# Patient Record
Sex: Male | Born: 1970 | Race: Black or African American | Hispanic: No | Marital: Single | Smoking: Current every day smoker
Health system: Southern US, Community
[De-identification: ages and names within clinical notes are randomized; demographics above are authoritative.]

## PROBLEM LIST (undated history)

## (undated) DIAGNOSIS — F1020 Alcohol dependence, uncomplicated: Secondary | ICD-10-CM

## (undated) DIAGNOSIS — I10 Essential (primary) hypertension: Secondary | ICD-10-CM

## (undated) DIAGNOSIS — K219 Gastro-esophageal reflux disease without esophagitis: Secondary | ICD-10-CM

## (undated) DIAGNOSIS — K861 Other chronic pancreatitis: Secondary | ICD-10-CM

## (undated) HISTORY — PX: OTHER SURGICAL HISTORY: SHX169

## (undated) HISTORY — PX: WRIST SURGERY: SHX841

---

## 2006-07-02 ENCOUNTER — Encounter (HOSPITAL_COMMUNITY): Admission: RE | Admit: 2006-07-02 | Discharge: 2006-08-01 | Payer: Self-pay

## 2008-01-03 ENCOUNTER — Inpatient Hospital Stay (HOSPITAL_COMMUNITY): Admission: EM | Admit: 2008-01-03 | Discharge: 2008-01-07 | Payer: Self-pay | Admitting: Emergency Medicine

## 2008-01-04 ENCOUNTER — Ambulatory Visit: Payer: Self-pay | Admitting: Gastroenterology

## 2008-02-12 ENCOUNTER — Ambulatory Visit: Payer: Self-pay | Admitting: Gastroenterology

## 2008-08-25 ENCOUNTER — Ambulatory Visit: Payer: Self-pay | Admitting: Gastroenterology

## 2009-08-05 ENCOUNTER — Encounter (INDEPENDENT_AMBULATORY_CARE_PROVIDER_SITE_OTHER): Payer: Self-pay | Admitting: *Deleted

## 2009-08-30 DIAGNOSIS — Z794 Long term (current) use of insulin: Secondary | ICD-10-CM

## 2009-08-30 DIAGNOSIS — K859 Acute pancreatitis without necrosis or infection, unspecified: Secondary | ICD-10-CM | POA: Insufficient documentation

## 2009-08-30 DIAGNOSIS — E119 Type 2 diabetes mellitus without complications: Secondary | ICD-10-CM

## 2009-08-30 DIAGNOSIS — F1010 Alcohol abuse, uncomplicated: Secondary | ICD-10-CM

## 2009-08-30 DIAGNOSIS — E669 Obesity, unspecified: Secondary | ICD-10-CM

## 2009-08-30 DIAGNOSIS — F1210 Cannabis abuse, uncomplicated: Secondary | ICD-10-CM

## 2009-08-30 DIAGNOSIS — Z720 Tobacco use: Secondary | ICD-10-CM | POA: Insufficient documentation

## 2009-10-31 ENCOUNTER — Ambulatory Visit: Payer: Self-pay | Admitting: Gastroenterology

## 2009-10-31 DIAGNOSIS — K219 Gastro-esophageal reflux disease without esophagitis: Secondary | ICD-10-CM | POA: Insufficient documentation

## 2010-10-08 ENCOUNTER — Encounter: Payer: Self-pay | Admitting: Emergency Medicine

## 2010-10-19 NOTE — Assessment & Plan Note (Signed)
Summary: E30/ABD PAIN,GU   Visit Type:  Follow-up Visit Primary Care Provider:  Braylee Bosher [name]  Chief Complaint:  F/U needs Rx for Omeprazole.  History of Present Illness: [name] is a 40 y/o make here for return visit. He has h/o alcoholic pancreatitis (2009), DM, GERD. If he misses an omeprazole he has heartburn. He has occasional abdominal pain if he "overdoes it" with the alcohol. He reports drinking four 24 ounce beers on Sat or Sun each week. He denies alcohol use during the week. He had recent bloodwork at his PCP, 3 month check. He states his DM is under control. He has only rare vomiting. Denies melena, brbpr, constipation, diarrhea. He denies unintentional wt loss, but wt down from 249 to 214 in the last one year. He says he's been walking daily.   Current Medications (verified): 1)  Lantus 100 Unit/ml Soln (Insulin Glargine) .... 35 Units At Bedtime 2)  Humalog 100 Unit/ml Soln (Insulin Lispro (Human)) .Marland Kitchen.. 10 Units Before Meals 3)  Metformin Hcl 500 Mg Tabs (Metformin Hcl) .... Two Times A Day 4)  Omeprazole .... Once Daily 5)  Lisinopril .... Once Daily  Allergies (verified): No Known Drug Allergies  Review of Systems      See HPI  Vital Signs:  Patient profile:   40 year old male Height:      73.5 inches Weight:      214 pounds BMI:     27.95 Temp:     98.5 degrees F oral Pulse rate:   80 / minute BP sitting:   120 / 88  (left arm) Cuff size:   regular  Vitals Entered By: Cloria Spring LPN (October 31, 2009 10:29 AM)  Physical Exam  General:  Well developed, well nourished, no acute distress. Head:  Normocephalic and atraumatic. Eyes:  Conjunctivae pink, no scleral icterus.  Mouth:  Oropharyngeal mucosa moist, pink.  No lesions, erythema or exudate.    Lungs:  Clear throughout to auscultation. Heart:  Regular rate and rhythm; no murmurs, rubs,  or bruits. Abdomen:  Bowel sounds normal.  Abdomen is soft, nontender, nondistended.  No rebound or guarding.  No  hepatosplenomegaly, masses or hernias.  No abdominal bruits.  Extremities:  No clubbing, cyanosis, edema or deformities noted. Neurologic:  Alert and  oriented x4;  grossly normal neurologically. Skin:  Intact without significant lesions or rashes. Psych:  Alert and cooperative. Normal mood and affect.  Impression & Recommendations:  Problem # 1:  PANCREATITIS (ICD-577.0)  H/O pancreatitis secondary to alcohol use. No further significant symptoms of pancreatitis. Last documented episode in 4/09. He continues to drink one day on the weekend. Again encouraged him to stop drinking. Explanied benefits of alcohol cessation diabetes, liver, and pancreatic disease. Encouraged him to continue daily excercise. OV in one year.  Orders: Est. Patient Level II (78295)  Problem # 2:  GERD (ICD-530.81)  Controlled on omeprazole.  Orders: Est. Patient Level II (62130) Prescriptions: OMEPRAZOLE 20 MG CPDR (OMEPRAZOLE) one by mouth 30 mins before breakfast daily  #30 x 11   Entered and Authorized by:   [name]   Signed by:   [name] Battles Dixon [name] on 10/31/2009   Method used:   Electronically to        [facility] (retail)       [address]       [place], [place]  [address]       Ph:  [phone]       Fax: [phone]   RxID:   [id number]

## 2010-10-31 ENCOUNTER — Encounter (INDEPENDENT_AMBULATORY_CARE_PROVIDER_SITE_OTHER): Payer: Self-pay | Admitting: *Deleted

## 2010-11-08 NOTE — Letter (Signed)
Summary: Recall Office Visit  [facility]  [address]   [place], [place] [address]   Phone: [phone]  Fax: [phone]      October 31, 2010   [name] [address] [place], [place]  [address] [birth year]   Dear Mr. [name],   According to our records, it is time for you to schedule a follow-up office visit with [place].   At your convenience, please call [phone] to schedule an office visit. If you have any questions, concerns, or feel that this letter is in error, we would appreciate your call.   Sincerely,    Diana Eves  [facility] Ph: [phone]   Fax: [phone]

## 2010-11-16 ENCOUNTER — Emergency Department (HOSPITAL_COMMUNITY)
Admission: EM | Admit: 2010-11-16 | Discharge: 2010-11-16 | Disposition: A | Discharge status: Home / Self Care | Payer: Medicare Other | Attending: Emergency Medicine | Admitting: Emergency Medicine

## 2010-11-16 DIAGNOSIS — R1013 Epigastric pain: Secondary | ICD-10-CM | POA: Insufficient documentation

## 2010-11-16 DIAGNOSIS — Z79899 Other long term (current) drug therapy: Secondary | ICD-10-CM | POA: Insufficient documentation

## 2010-11-16 DIAGNOSIS — I10 Essential (primary) hypertension: Secondary | ICD-10-CM | POA: Insufficient documentation

## 2010-11-16 DIAGNOSIS — Z794 Long term (current) use of insulin: Secondary | ICD-10-CM | POA: Insufficient documentation

## 2010-11-16 DIAGNOSIS — E119 Type 2 diabetes mellitus without complications: Secondary | ICD-10-CM | POA: Insufficient documentation

## 2010-11-16 LAB — DIFFERENTIAL
Basophils Absolute: 0 10*3/uL (ref 0.0–0.1)
Basophils Relative: 0 % (ref 0–1)
Eosinophils Absolute: 0 10*3/uL (ref 0.0–0.7)
Neutrophils Relative %: 76 % (ref 43–77)

## 2010-11-16 LAB — COMPREHENSIVE METABOLIC PANEL
Albumin: 3.8 g/dL (ref 3.5–5.2)
BUN: 6 mg/dL (ref 6–23)
Chloride: 98 mEq/L (ref 96–112)
Creatinine, Ser: 1.07 mg/dL (ref 0.4–1.5)
Total Bilirubin: 0.9 mg/dL (ref 0.3–1.2)
Total Protein: 6.3 g/dL (ref 6.0–8.3)

## 2010-11-16 LAB — POCT CARDIAC MARKERS
CKMB, poc: 1.5 ng/mL (ref 1.0–8.0)
Troponin i, poc: <0.05 ng/mL (ref 0.00–0.09)

## 2010-11-16 LAB — CBC
Platelets: 246 10*3/uL (ref 150–400)
RBC: 3.99 MIL/uL — ABNORMAL LOW (ref 4.22–5.81)
WBC: 8.4 10*3/uL (ref 4.0–10.5)

## 2011-01-30 NOTE — Group Therapy Note (Signed)
NAMEDRUE, HARR NO.:  [id number]   MEDICAL RECORD NO.:  [id number]          PATIENT TYPE:  INP   LOCATION:  IC09                          FACILITY:  [facility]   PHYSICIAN:  Skeet Latch, DO    DATE OF BIRTH:  [name], [name]   DATE OF PROCEDURE:  01/04/2008  DATE OF DISCHARGE:                                 PROGRESS NOTE   SUBJECTIVE:  [name] is feeling better today. The patient has less  abdominal discomfort than yesterday. The patient denies any nausea,  vomiting, or diarrhea today and seems to be improving.   Vital signs are stable. Cardiovascular:  Regular rate and rhythm. No  murmur, rub, or gallop. Lungs are clear to auscultation bilaterally with  no rales, rhonchi, or wheezing. Abdomen is soft and still has some  epigastric tenderness on deep palpation. No rigidity or guarding.  Positive bowel sounds. Extremities:  No clubbing, cyanosis, or edema.   LABS:  Sodium 131, potassium 3.1, chloride 103, CO2 17, glucose 274, BUN  15, creatinine 1.36. Lipid panel showed cholesterol of 295,  triglycerides 241, HDL 31, and LDL 216. ABG showed pH of 7.337, PCO2  25.9, PO2 95.7, bicarb 13.5;  lipase is 610. Phosphorus 2.3, magnesium  2.6, amylase is 277. White count is 14,100. Hemoglobin 13.8, hematocrit  39.3, platelet count 122.   CT of his abdomen performed shows acute pancreatitis.   ASSESSMENT AND PLAN:  1. Diabetic ketoacidosis. For now the patient's IV has been switched      to D5 1/2 normal saline. We will continue to check his blood sugars      as needed. The patient will receive diabetic counseling.  2. Hypokalemia. This will be continued to be replaced via IV and      repeat 4 hours after last dose of KCl.  3. Acute pancreatitis. The patient will be continued to be kept n.p.o.      for now, and gastroenterology has been consulted.  4. Alcohol abuse. The patient has been placed on Librium for now. He      will receive counseling regarding substance  abuse also.  5. Leukocytosis. It seems to be improving. The patient is on IV      Rocephin. We will continue his IV Rocephin at this time and      continue to check his white count with daily labs. We will continue      to follow his amylase and lipase with LFTs. At this time, will      continue current treatment.      Skeet Latch, DO  Electronically Signed     SM/MEDQ  D:  01/04/2008  T:  01/04/2008  Job:  [id number]

## 2011-01-30 NOTE — Assessment & Plan Note (Signed)
NAMEMarland [name]  MARQUICE, UDDIN                 CHART#:  [id number]   DATE:  02/12/2008                       DOB:  08/23/1971   REFERRING PHYSICIAN:  Claretta Fraise, MD.   PROBLEM LIST:  1. Diabetes complicated by diabetic ketoacidosis in April 2009.  2. Alcoholic pancreatitis.  3. Alcohol Abuse   SUBJECTIVE:  Mr. Markarian is a 40 year old male who presents as a return  patient visit.  He has no particular questions, concerns or complaints.  He continues to drink 1-2 beers daily.  He is otherwise doing well.   MEDICATIONS:  1. Lantus 35 units q.h.s.  2. Humalog 10 units before meals.  3. Omeprazole 20 mg b.i.d.  4. Metformin 500 mg b.i.d.  5. Blood pressure medicine.   OBJECTIVE:  VITAL SIGNS:  Weight 246 pounds, height 6 feet 4 inches, BMI  32.1 (obese),  temperature 98, blood pressure 140/94, pulse 80.  GENERAL:  He is in no apparent distress.  Alert and oriented x4.  LUNGS:  Clear to auscultation bilaterally.  CARDIOVASCULAR:  Regular rhythm.  ABDOMEN:  Bowel sounds are present, soft, nontender, nondistended,  obese.   ASSESSMENT:  Mr. Blankenburg is a 40 year old male who presented with  alcoholic pancreatitis with a lipase measured at 2,125.  He also had  diabetic ketoacidosis and renal failure.  He continues to drink alcohol.  Thank you for allowing me to see Mr. Groom in consultation.  My  recommendations follow.   RECOMMENDATIONS:  1. He should continue his omeprazole and he may take it once a day.      He is given a prescription.  2. I strongly urged him to stop drinking and explained the benefits in      regards to his pancreas and his diabetic control.  I did explain to      him that if he continues to drink, he is at risk for developing      chronic pancreatitis.  I said the pain of chronic pancreatitis was      not something that he really wants to have.  So Mr. Hessel will      need to make his own decisions as to      whether or not he continues to drink.  3. Follow up  visit in 6 months.       Kassie Mends, M.D.  Electronically Signed     SM/MEDQ  D:  02/12/2008  T:  02/12/2008  Job:  [id number]

## 2011-01-30 NOTE — Discharge Summary (Signed)
NAMETRESTAN, VAHLE NO.:  [id number]   MEDICAL RECORD NO.:  [id number]          PATIENT TYPE:  INP   LOCATION:  A339                          FACILITY:  [facility]   PHYSICIAN:  Skeet Latch, DO    DATE OF BIRTH:  10-Jan-1971   DATE OF ADMISSION:  01/03/2008  DATE OF DISCHARGE:  04/22/2009LH                               DISCHARGE SUMMARY   BRIEF HOSPITAL COURSE:  Mr. Eidson is a 40 year old African-American  male who presented with a 1 week history of polyuria, polydipsia,  abdominal pain and changes in his incision.  The patient went to [facility].  Blood workup was performed.  The patient was told to  followup, but secondary to his symptoms, he came to the emergency room  to be evaluated.  Upon being seen in the ER here, the patient was found  to have a sodium of 124, potassium 4.6, chloride 190, CO2 14, glucose  76, BUN 19, creatinine 2.15.  He had a few bacteria in his urine.  His  urine was positive for ketones, protein, hemoglobin, bilirubin and  glucose.  The patient was admitted to the ICU with diabetic ketoacidosis  as well as acute pancreatitis.  His initial amylase was 774 and lipase  2,125.  The patient was placed in the ICU.  Placed on IV hydration and  had his potassium replaced via IV and p.o.  The patient received  diabetic counseling.  The patient was subsequently started on IV insulin  drip.  This was discontinued.  The patient was started on D5 half-normal  saline.  The patient is now on sliding-scale insulin and seems to be  improving.   Secondary to his pancreatitis, gastroenterology was consulted.  The  patient was kept n.p.o., started on clear liquids and now he is on a  modified diet.  The patient's initial radiologic studies, CT of his  abdomen showed acute pancreatitis.  The patient was also found to have  history of alcohol abuse.  He was started on Librium and this has been  continued throughout his hospital stay.  The  patient's abdominal pain  has improved.  The patient is eating a modified diet.  His blood sugars  are within reasonable range at this time.  We feel the patient can be  discharged to home.  He has received recommendations with the diabetes  team.  We feel the patient can be discharged with Lantus and Humalog.   DISCHARGE MEDICATIONS:  1. Lantus 30 units subcu. q.h.s.  2. Simvastatin 40 mg daily.  3. Humalog 10 units with meals.  4. Prevacid 30 mg daily.   Vital signs on discharge:  Pulse 105, respirations 27, he is satting 99%  on room air.   LABORATORY DATA:  His blood cultures so far are negative.  Sodium 132,  potassium 3.7, chloride 105, CO2 22, glucose 280, BUN 6, creatinine  0.95, white count 9,800, hemoglobin 11.4, hematocrit 32.3, platelet  count 152,000.   CONDITION ON DISCHARGE:  Stable.   DISPOSITION:  The patient will be discharged to home.   DISCHARGE  INSTRUCTIONS:  1. The patient is to maintain a 2,000 ADA diet.  2. He is to increase his activity slowly.  3. The patient is told to follow with [facility] in the      next 2-3 days to have his blood sugars checked.  4. The patient will be instructed to take his Lantus and NovoLog as      instructed.  This will definitely need to be titrated by his      primary care physician.  Since the patient had pancreatitis, unsure      how his pancreas will respond after it totally resolves.  The      patient needs close monitoring by primary care physician.  5. The patient will be instructed to return to the emergency room if      he has any major complications or concerns.  6. We will try to get patient a sample starter kit for his diabetes      for Glucometer and necessary equipment to check his blood sugars at      least 3 times a day.      Skeet Latch, DO  Electronically Signed     SM/MEDQ  D:  01/06/2008  T:  01/06/2008  Job:  [id number]
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
## 2011-01-30 NOTE — Assessment & Plan Note (Signed)
NAME:  [name], [name]                 CHART#:  [id number]   DATE:  08/25/2008                       DOB:  11-17-1970   REFERRING PHYSICIAN:  Brita Romp. Lewis Moccasin, MD.   PROBLEM LIST:  1. Diabetic ketoacidosis in April 2009 associated with acute alcoholic      pancreatitis and renal failure.  2. Alcohol use.   SUBJECTIVE:  The patient is a 40 year old male who presents as a return  patient visit.  He continues to drink a couple of beers on the  weekends,  2-3 [employer].  He is otherwise doing well.  He denies any  nausea or vomiting.  He occasionally has abdominal pain.  He has not  been hospitalized.   MEDICATIONS:  1. Lantus.  2. Humalog.  3. Omeprazole 20 mg daily.  4. Metformin 500 mg b.i.d.  5. Lisinopril.   OBJECTIVE:  VITAL SIGNS:  Weight 249 pounds (up 3 pounds since May  2009), height 6 feet 1 inches, temperature 98.3, blood pressure 140/92,  and pulse 56.  GENERAL:  He is in no apparent distress.  Alert and oriented x4.LUNGS:  Clear to auscultation bilaterally.CARDIOVASCULAR:  Regular rhythm.  No  murmur.  Normal S1 and S2.ABDOMEN:  Bowel sounds are present, soft,  nontender, nondistended, obese.   ASSESSMENT:  The patient is a 40 year old male who is obese, diabetic,  and continues to use alcohol.  Thank you for allowing me to see the  patient in consultation.  My recommendations follow.   RECOMMENDATIONS:  1. Recommended he lose 10-20 pounds.  2. He should continue to take the omeprazole especially since he      continues to consume alcohol.  3. Again, I urged him to stop drinking and explained the benefits of      alcohol cessation on his      weight, diabetes control, liver disease and pancreatic disease.  4. He has a follow up appointment to see me in 12 months.       Kassie Mends, M.D.  Electronically Signed     SM/MEDQ  D:  08/25/2008  T:  08/25/2008  Job:  [id number]   cc:   Clair Gulling, MD

## 2011-01-30 NOTE — Group Therapy Note (Signed)
[name], [name] NO.:  [id number]   MEDICAL RECORD NO.:  [id number]          PATIENT TYPE:  INP   LOCATION:  IC09                          FACILITY:  [facility]   PHYSICIAN:  Skeet Latch, DO    DATE OF BIRTH:  Jan 06, 1971   DATE OF PROCEDURE:  01/05/2008  DATE OF DISCHARGE:                                 PROGRESS NOTE   SUBJECTIVE:  [name] continues to improve.  The patient still has  some abdominal discomfort but is improving daily.  The patient denies  any nausea, vomiting, diarrhea or any kind of  chest pain at this time.  The patient's diet, he has been started on a clear liquid diet also.   OBJECTIVE:  VITAL SIGNS:  Blood pressure is 108/73, pulse 93,  respirations 18, sating 100% on room air.  CARDIOVASCULAR:  Regular rate and rhythm.  No murmurs, rubs, or gallops.  LUNGS:  Clear to auscultation bilaterally.  No rales, rhonchi, or  wheezing.  ABDOMEN:  Soft.  He has some epigastric and left lower quadrant  tenderness that seems to be improving.  No rigidity or guarding.  EXTREMITIES:  No clubbing, cyanosis, or edema.   LABORATORY:  Lipase is 158, amylase 147.  Sodium 132, potassium  __________  , chloride 105, CO2 24, glucose 142, BUN 8, creatinine 1.06.  White count __________  hemoglobin 12.3, hematocrit 34.1, platelet count  125.   ASSESSMENT/PLAN:  1. Diabetic ketoacidosis.  The patient is currently on D-5 1/2 normal      saline twice a day.  Blood sugars continue to decrease.  He spoke      with diabetes counseling and I feel that we need to be very careful      regarding  insulin drip secondary to the patient having concomitant      acute pancreatitis as sugars may go up after his pancreatitis      resolves.  We will continue to monitor closely and if sugars go up,      may restart him on an insulin drip.  2. Hypokalemia.  Continue to replace, continue with intravenous and      oral.  3. Acute pancreatitis.  We will start her on clear  liquids.  The      patient's amylase and lipase continue to decrease.  The patient      symptoms continue to improve.  4. History of alcohol abuse.  The patient is on Librium, we will      continue at this time.  5. Leukocytosis has resolved.  We will continue him on intravenous      antibiotics at this time.  We will continue to monitor.   The patient continues to improve.  We will transfer to a regular bed  next 24-48 hours.      Skeet Latch, DO  Electronically Signed    SM/MEDQ  D:  01/05/2008  T:  01/05/2008  Job:  [id number]

## 2011-05-24 ENCOUNTER — Inpatient Hospital Stay (HOSPITAL_COMMUNITY): Payer: Medicare Other

## 2011-05-24 ENCOUNTER — Inpatient Hospital Stay (HOSPITAL_COMMUNITY)
Admission: EM | Admit: 2011-05-24 | Discharge: 2011-05-28 | DRG: 871 | Disposition: A | Discharge status: Home Health | Payer: Medicare Other | Source: Other Acute Inpatient Hospital | Attending: Pulmonary Disease | Admitting: Pulmonary Disease

## 2011-05-24 ENCOUNTER — Emergency Department (HOSPITAL_COMMUNITY)
Admission: EM | Admit: 2011-05-24 | Discharge: 2011-05-24 | Disposition: A | Discharge status: Other Acute Inpatient Hospital | Payer: Medicare Other | Source: Home / Self Care | Attending: Emergency Medicine | Admitting: Emergency Medicine

## 2011-05-24 DIAGNOSIS — N179 Acute kidney failure, unspecified: Secondary | ICD-10-CM | POA: Diagnosis present

## 2011-05-24 DIAGNOSIS — E876 Hypokalemia: Secondary | ICD-10-CM | POA: Diagnosis present

## 2011-05-24 DIAGNOSIS — K612 Anorectal abscess: Secondary | ICD-10-CM

## 2011-05-24 DIAGNOSIS — K859 Acute pancreatitis without necrosis or infection, unspecified: Secondary | ICD-10-CM | POA: Diagnosis present

## 2011-05-24 DIAGNOSIS — Z23 Encounter for immunization: Secondary | ICD-10-CM

## 2011-05-24 DIAGNOSIS — E872 Acidosis, unspecified: Secondary | ICD-10-CM | POA: Diagnosis present

## 2011-05-24 DIAGNOSIS — E785 Hyperlipidemia, unspecified: Secondary | ICD-10-CM | POA: Diagnosis present

## 2011-05-24 DIAGNOSIS — IMO0002 Reserved for concepts with insufficient information to code with codable children: Secondary | ICD-10-CM

## 2011-05-24 DIAGNOSIS — D649 Anemia, unspecified: Secondary | ICD-10-CM

## 2011-05-24 DIAGNOSIS — I10 Essential (primary) hypertension: Secondary | ICD-10-CM | POA: Diagnosis present

## 2011-05-24 DIAGNOSIS — K861 Other chronic pancreatitis: Secondary | ICD-10-CM | POA: Diagnosis present

## 2011-05-24 DIAGNOSIS — A419 Sepsis, unspecified organism: Secondary | ICD-10-CM

## 2011-05-24 DIAGNOSIS — E119 Type 2 diabetes mellitus without complications: Secondary | ICD-10-CM | POA: Diagnosis present

## 2011-05-24 DIAGNOSIS — Z794 Long term (current) use of insulin: Secondary | ICD-10-CM

## 2011-05-24 DIAGNOSIS — Z79899 Other long term (current) drug therapy: Secondary | ICD-10-CM

## 2011-05-24 DIAGNOSIS — K863 Pseudocyst of pancreas: Secondary | ICD-10-CM | POA: Diagnosis present

## 2011-05-24 DIAGNOSIS — R6510 Systemic inflammatory response syndrome (SIRS) of non-infectious origin without acute organ dysfunction: Secondary | ICD-10-CM | POA: Diagnosis present

## 2011-05-24 DIAGNOSIS — K862 Cyst of pancreas: Secondary | ICD-10-CM | POA: Diagnosis present

## 2011-05-24 LAB — BASIC METABOLIC PANEL
BUN: 74 mg/dL — ABNORMAL HIGH (ref 6–23)
CO2: UNDETERMINED mEq/L (ref 19–32)
CO2: 10 mEq/L — CL (ref 19–32)
Calcium: UNDETERMINED mg/dL (ref 8.4–10.5)
Calcium: 9 mg/dL (ref 8.4–10.5)
Calcium: 8.3 mg/dL — ABNORMAL LOW (ref 8.4–10.5)
Chloride: UNDETERMINED mEq/L (ref 96–112)
Creatinine, Ser: UNDETERMINED mg/dL (ref 0.50–1.35)
Creatinine, Ser: 7.92 mg/dL — ABNORMAL HIGH (ref 0.50–1.35)
Creatinine, Ser: 6.6 mg/dL — ABNORMAL HIGH (ref 0.50–1.35)
GFR calc Af Amer: 9 mL/min — ABNORMAL LOW (ref >60)
GFR calc non Af Amer: 8 mL/min — ABNORMAL LOW (ref >60)
GFR calc non Af Amer: 9 mL/min — ABNORMAL LOW (ref >60)
Glucose, Bld: UNDETERMINED mg/dL (ref 70–99)
Glucose, Bld: 101 mg/dL — ABNORMAL HIGH (ref 70–99)

## 2011-05-24 LAB — TYPE AND SCREEN: Unit division: 0

## 2011-05-24 LAB — CBC
HCT: 19.1 % — ABNORMAL LOW (ref 39.0–52.0)
HCT: 42.9 % (ref 39.0–52.0)
HCT: 44.3 % (ref 39.0–52.0)
Hemoglobin: 6.6 g/dL — CL (ref 13.0–17.0)
Hemoglobin: 16.2 g/dL (ref 13.0–17.0)
MCH: 34.7 pg — ABNORMAL HIGH (ref 26.0–34.0)
MCH: 35.8 pg — ABNORMAL HIGH (ref 26.0–34.0)
MCHC: 34.6 g/dL (ref 30.0–36.0)
MCHC: 34.7 g/dL (ref 30.0–36.0)
MCHC: 36.6 g/dL — ABNORMAL HIGH (ref 30.0–36.0)
MCV: 100.5 fL — ABNORMAL HIGH (ref 78.0–100.0)
MCV: 100.2 fL — ABNORMAL HIGH (ref 78.0–100.0)
MCV: 97.8 fL (ref 78.0–100.0)
Platelets: 282 10*3/uL (ref 150–400)
RDW: 14.7 % (ref 11.5–15.5)
RDW: 14.8 % (ref 11.5–15.5)
RDW: 14.6 % (ref 11.5–15.5)

## 2011-05-24 LAB — GLUCOSE, CAPILLARY: Glucose-Capillary: 125 mg/dL — ABNORMAL HIGH (ref 70–99)

## 2011-05-24 LAB — DIFFERENTIAL
Basophils Absolute: 0 10*3/uL (ref 0.0–0.1)
Basophils Absolute: 0 10*3/uL (ref 0.0–0.1)
Basophils Relative: 0 % (ref 0–1)
Basophils Relative: 0 % (ref 0–1)
Eosinophils Absolute: 0 10*3/uL (ref 0.0–0.7)
Eosinophils Relative: 0 % (ref 0–5)
Lymphs Abs: 0.9 10*3/uL (ref 0.7–4.0)
Monocytes Absolute: 0.6 10*3/uL (ref 0.1–1.0)
Monocytes Absolute: 1.3 10*3/uL — ABNORMAL HIGH (ref 0.1–1.0)
Neutro Abs: 7.5 10*3/uL (ref 1.7–7.7)

## 2011-05-24 LAB — MRSA PCR SCREENING: MRSA by PCR: NEGATIVE

## 2011-05-24 LAB — LACTIC ACID, PLASMA: Lactic Acid, Venous: 0.4 mmol/L — ABNORMAL LOW (ref 0.5–2.2)

## 2011-05-24 MED ORDER — SODIUM CHLORIDE 0.9 % IV BOLUS (SEPSIS)
1000.0000 mL | Freq: Once | INTRAVENOUS | Status: AC
  Administered 2011-05-24: 1000 mL via INTRAVENOUS

## 2011-05-24 MED ORDER — SODIUM CHLORIDE 0.9 % IV BOLUS (SEPSIS): 500.0000 mL | Freq: Once | INTRAVENOUS | Status: DC

## 2011-05-24 MED ORDER — PIPERACILLIN-TAZOBACTAM 3.375 G IVPB
3.3750 g | Freq: Once | INTRAVENOUS | Status: DC
Start: 2011-05-24 — End: 2011-05-24
  Administered 2011-05-24: 3.375 g via INTRAVENOUS
  Filled 2011-05-24: qty 50

## 2011-05-24 MED ORDER — SODIUM CHLORIDE 0.9 % IV BOLUS (SEPSIS): 1000.0000 mL | Freq: Once | INTRAVENOUS | Status: DC

## 2011-05-24 MED ORDER — DEXTROSE 50 % IV SOLN: 25.0000 g | Freq: Once | INTRAVENOUS | Status: DC

## 2011-05-24 MED ORDER — VANCOMYCIN HCL IN DEXTROSE 1-5 GM/200ML-% IV SOLN
1000.0000 mg | Freq: Once | INTRAVENOUS | Status: AC
  Administered 2011-05-24: 1000 mg via INTRAVENOUS
  Filled 2011-05-24: qty 200

## 2011-05-24 NOTE — ED Provider Notes (Addendum)
Scribed for Dr. Bebe Shaggy, the patient was seen in room 04. This chart was scribed by Hillery Hunter. This patient's care was started at 15:00.   History    CSN: [id number] Arrival date & time: 05/24/2011  2:42 PM  Chief Complaint  Patient presents with  . Abscess  . Rash   Patient is a 40 y.o. male presenting with abscess and rash. The history is provided by the patient.  Abscess  Pertinent negatives include no fever.  Rash     [name] is a 39 y.o. male who presents to the Emergency Department complaining of boil on his buttocks that started one week ago and has gradually worsened. He complains of abdominal pain and shortness of breath and denies fever. He reports that he was drinking alcohol daily but quit one week ago. He has a history of DM and HTN but denies any prior abdominal surgeries.  HPI ELEMENTS:  Location: perineum  Onset: one week ago  Timing: constant, gradually worsenintg  Quality: boil   Modifying factors: tenderness to palpation Context: as above  Associated symptoms: as above     Past Medical History  Diagnosis Date  . Diabetes mellitus     History reviewed. No pertinent past surgical history.  No family history on file.  History  Substance Use Topics  . Smoking status: Current Everyday Smoker  . Smokeless tobacco: Not on file  . Alcohol Use: Yes     says quit a week ago      Review of Systems  Constitutional: Negative for fever and chills.  Respiratory: Positive for shortness of breath.   Gastrointestinal: Positive for abdominal pain.  Genitourinary: Negative for difficulty urinating.  Skin: Positive for rash.  Psychiatric/Behavioral: Negative for confusion.  All other systems reviewed and are negative.    Physical Exam  BP 80/42  Pulse 92  Temp(Src) 97.3 F (36.3 C) (Rectal)  Resp 20  Ht 6\' 1"  (1.854 m)  Wt 170 lb (77.111 kg)  BMI 22.43 kg/m2  SpO2 100%  Physical Exam CONSTITUTIONAL: Well developed/well  nourished HEAD AND FACE: Normocephalic/atraumatic EYES: EOMI/PERRL ENMT: Dry mucous membranes  NECK: supple, no meningeal signs SPINE:entire spine nontender CV: S1/S2 noted, no murmurs/rubs/gallops noted LUNGS: Lungs are clear to auscultation bilaterally, no apparent distress ABDOMEN: soft, nontender, no rebound or guarding GU: No CVA tenderness. Large open wound draining pus in the perineum, it does not extend to the rectum or into the scrotum.  Penis/scrotum normal in appearance NEURO: Pt is awake/alert, moves all extremities x4 EXTREMITIES: pulses normal, full ROM SKIN: warm, color normal PSYCH: no abnormalities of mood noted  ED Course  CRITICAL CARE Performed by: Joya Gaskins Authorized by: Joya Gaskins Total critical care time: 40 minutes Critical care time was exclusive of separately billable procedures and treating other patients. Critical care was necessary to treat or prevent imminent or life-threatening deterioration of the following conditions: circulatory failure, sepsis and shock. Critical care was time spent personally by me on the following activities: discussions with consultants, evaluation of patient's response to treatment, ordering and review of laboratory studies, re-evaluation of patient's condition, examination of patient and development of treatment plan with patient or surrogate.    OTHER DATA REVIEWED: Nursing notes, vital signs, and past medical records reviewed.  DIAGNOSTIC STUDIES: Oxygen Saturation is 100% on room air, normal by my interpretation.    LABS / RADIOLOGY:  Results for orders placed during the hospital encounter of 05/24/11  GLUCOSE, CAPILLARY  Component Value Range   Glucose-Capillary 125 (*) 70 - 99 (mg/dL)  CBC      Component Value Range   WBC 9.0  4.0 - 10.5 (K/uL)   RBC 1.90 (*) 4.22 - 5.81 (MIL/uL)   Hemoglobin 6.6 (*) 13.0 - 17.0 (g/dL)   HCT 19.1 (*) 47.8 - 52.0 (%)   MCV 100.5 (*) 78.0 - 100.0 (fL)   MCH  34.7 (*) 26.0 - 34.0 (pg)   MCHC 34.6  30.0 - 36.0 (g/dL)   RDW 29.5  62.1 - 30.8 (%)   Platelets 139 (*) 150 - 400 (K/uL)  DIFFERENTIAL      Component Value Range   Neutrophils Relative PENDING  43 - 77 (%)   Neutro Abs PENDING  1.7 - 7.7 (K/uL)   Band Neutrophils PENDING  0 - 10 (%)   Lymphocytes Relative PENDING  12 - 46 (%)   Lymphs Abs PENDING  0.7 - 4.0 (K/uL)   Monocytes Relative PENDING  3 - 12 (%)   Monocytes Absolute PENDING  0.1 - 1.0 (K/uL)   Eosinophils Relative PENDING  0 - 5 (%)   Eosinophils Absolute PENDING  0.0 - 0.7 (K/uL)   Basophils Relative PENDING  0 - 1 (%)   Basophils Absolute PENDING  0.0 - 0.1 (K/uL)   WBC Morphology PENDING     RBC Morphology PENDING     Smear Review PENDING     nRBC PENDING  0 (/100 WBC)   Metamyelocytes Relative PENDING     Myelocytes PENDING     Promyelocytes Absolute PENDING     Blasts PENDING        ED COURSE / COORDINATION OF CARE: 15:10. Initial orders include: CBC, BMP, lactic acid, procalcitonin, blood cultures, fingerstick glucose U/A, urine culture,  IV NS 2L bolus wide open, page surgery 15:18. Surgery called back and discussed history and physical. Dr. Lovell Sheehan will be here to see patient in approximately ten minutes. 15:59. Seen by Lovell Sheehan, he feels patient needs higher level of care at [facility] cone with intensivist and surgical eval there.  Does not need emergent debridement 16:00. Ordered Zosyn 3.375 IVPB D/w dr simonds, critical care.  Will accept patient now.  He asks me to continue zosyn/vancomycin.  Start PRBC due to severe anemia.  Will defer imaging for now until he arrives at [facility] BP 104/63  Pulse 96  Temp(Src) 97.3 F (36.3 C) (Rectal)  Resp 24  Ht 6\' 1"  (1.854 m)  Wt 170 lb (77.111 kg)  BMI 22.43 kg/m2  SpO2 100% Pt vitals improved Pt stabilized in ED  MDM: pt stabilized here, but in septic shock due to large wound in perineum    IMPRESSION: Diagnoses that have been ruled out:  Diagnoses that  are still under consideration:  Final diagnoses:  Sepsis  Anemia    PLAN:  Admission/transfer to [facility]   MEDICATIONS GIVEN IN THE E.D.  Medications  sodium chloride 0.9 % bolus 1,000 mL (1000 mL Intravenous Given 05/24/11 1517)  sodium chloride 0.9 % bolus 500 mL (500 mL Intravenous Given 05/24/11)  sodium chloride 0.9 % bolus 1,000 mL (1000 mL Intravenous Given 05/24/11)  piperacillin-tazobactam (ZOSYN) IVPB 3.375 g (3.375 g Intravenous Given 05/24/11 1605)    SCRIBE ATTESTATION: I personally performed the services described in this documentation, which was scribed in my presence. The recorded information has been reviewed and considered. Joya Gaskins, MD       Joya Gaskins, MD 05/24/11 [phone]  D/w lab, they feel initial labs contaminated, will redraw.  Will hold PRBC for now SBP over 110 at this time  Joya Gaskins, MD 05/24/11 [phone]

## 2011-05-24 NOTE — ED Notes (Signed)
CRITICAL VALUE ALERT  Critical value received:  hgb 6.6 and hct 19.1  Date of notification: 05/24/11  Time of notification 15.50  Critical value read back: yes  Nurse who received alert: j. Bing Quarry  MD notified (1st page): [email or web address]  Time of first page: 15:50  MD notified (2nd page):  Time of second page:  Responding MD:  wickline  Time MD responded: 15:50

## 2011-05-24 NOTE — ED Notes (Signed)
Pt reports boil on buttocks x 4 days and rash on lower abd.

## 2011-05-24 NOTE — ED Notes (Signed)
Dr Lovell Sheehan here to eval

## 2011-05-24 NOTE — ED Notes (Signed)
Attempted to insert foley. Resistance met. edp aware. Lab called and stated they were going to redraw lab due to abnormal values

## 2011-05-24 NOTE — ED Notes (Signed)
[name], from lab called and states pt's hgb 14.9 and hct 42.9.

## 2011-05-25 ENCOUNTER — Inpatient Hospital Stay (HOSPITAL_COMMUNITY): Payer: Medicare Other

## 2011-05-25 DIAGNOSIS — N179 Acute kidney failure, unspecified: Secondary | ICD-10-CM

## 2011-05-25 DIAGNOSIS — E876 Hypokalemia: Secondary | ICD-10-CM

## 2011-05-25 DIAGNOSIS — K612 Anorectal abscess: Secondary | ICD-10-CM

## 2011-05-25 DIAGNOSIS — A419 Sepsis, unspecified organism: Secondary | ICD-10-CM

## 2011-05-25 DIAGNOSIS — R6521 Severe sepsis with septic shock: Secondary | ICD-10-CM

## 2011-05-25 LAB — CBC
HCT: 39.1 % (ref 39.0–52.0)
MCH: 34.7 pg — ABNORMAL HIGH (ref 26.0–34.0)
MCV: 96.8 fL (ref 78.0–100.0)
RBC: 4.04 MIL/uL — ABNORMAL LOW (ref 4.22–5.81)
RDW: 14.6 % (ref 11.5–15.5)
WBC: 12.9 10*3/uL — ABNORMAL HIGH (ref 4.0–10.5)

## 2011-05-25 LAB — LIPASE, BLOOD: Lipase: 578 U/L — ABNORMAL HIGH (ref 11–59)

## 2011-05-25 LAB — BASIC METABOLIC PANEL
BUN: 72 mg/dL — ABNORMAL HIGH (ref 6–23)
BUN: 57 mg/dL — ABNORMAL HIGH (ref 6–23)
Chloride: 105 mEq/L (ref 96–112)
Creatinine, Ser: 5.58 mg/dL — ABNORMAL HIGH (ref 0.50–1.35)
Creatinine, Ser: 3.32 mg/dL — ABNORMAL HIGH (ref 0.50–1.35)
GFR calc Af Amer: 25 mL/min — ABNORMAL LOW (ref >60)
GFR calc non Af Amer: 21 mL/min — ABNORMAL LOW (ref >60)
Glucose, Bld: 117 mg/dL — ABNORMAL HIGH (ref 70–99)
Potassium: 2.8 mEq/L — ABNORMAL LOW (ref 3.5–5.1)
Potassium: 3 mEq/L — ABNORMAL LOW (ref 3.5–5.1)

## 2011-05-25 LAB — URINALYSIS, ROUTINE W REFLEX MICROSCOPIC
Glucose, UA: NEGATIVE mg/dL
Protein, ur: 30 mg/dL — AB
Specific Gravity, Urine: 1.014 (ref 1.005–1.030)

## 2011-05-25 LAB — GLUCOSE, CAPILLARY
Glucose-Capillary: 116 mg/dL — ABNORMAL HIGH (ref 70–99)
Glucose-Capillary: 137 mg/dL — ABNORMAL HIGH (ref 70–99)

## 2011-05-25 LAB — URINE MICROSCOPIC-ADD ON

## 2011-05-25 LAB — STREP PNEUMONIAE URINARY ANTIGEN: Strep Pneumo Urinary Antigen: NEGATIVE

## 2011-05-25 LAB — VANCOMYCIN, RANDOM: Vancomycin Rm: 27.9 ug/mL

## 2011-05-26 ENCOUNTER — Inpatient Hospital Stay (HOSPITAL_COMMUNITY): Payer: Medicare Other

## 2011-05-26 LAB — VANCOMYCIN, RANDOM: Vancomycin Rm: 13.1 ug/mL

## 2011-05-26 LAB — URINE CULTURE: Culture: NO GROWTH

## 2011-05-26 LAB — CBC
Platelets: 313 10*3/uL (ref 150–400)
RDW: 14.1 % (ref 11.5–15.5)
WBC: 8.5 10*3/uL (ref 4.0–10.5)

## 2011-05-26 LAB — GLUCOSE, CAPILLARY
Glucose-Capillary: 132 mg/dL — ABNORMAL HIGH (ref 70–99)
Glucose-Capillary: 222 mg/dL — ABNORMAL HIGH (ref 70–99)
Glucose-Capillary: 108 mg/dL — ABNORMAL HIGH (ref 70–99)

## 2011-05-26 LAB — BASIC METABOLIC PANEL
Calcium: 8 mg/dL — ABNORMAL LOW (ref 8.4–10.5)
GFR calc non Af Amer: 39 mL/min — ABNORMAL LOW (ref >60)
Sodium: 135 mEq/L (ref 135–145)

## 2011-05-26 LAB — LACTIC ACID, PLASMA: Lactic Acid, Venous: 0.8 mmol/L (ref 0.5–2.2)

## 2011-05-27 DIAGNOSIS — K612 Anorectal abscess: Secondary | ICD-10-CM

## 2011-05-27 DIAGNOSIS — E876 Hypokalemia: Secondary | ICD-10-CM

## 2011-05-27 LAB — CBC
HCT: 36.1 % — ABNORMAL LOW (ref 39.0–52.0)
Hemoglobin: 13.4 g/dL (ref 13.0–17.0)
MCH: 34.8 pg — ABNORMAL HIGH (ref 26.0–34.0)
MCHC: 37.1 g/dL — ABNORMAL HIGH (ref 30.0–36.0)
MCV: 93.8 fL (ref 78.0–100.0)
Platelets: 315 10*3/uL (ref 150–400)
RBC: 3.85 MIL/uL — ABNORMAL LOW (ref 4.22–5.81)
RDW: 13.9 % (ref 11.5–15.5)
WBC: 7.2 10*3/uL (ref 4.0–10.5)

## 2011-05-27 LAB — BASIC METABOLIC PANEL
BUN: 25 mg/dL — ABNORMAL HIGH (ref 6–23)
CO2: 23 mEq/L (ref 19–32)
Calcium: 8.6 mg/dL (ref 8.4–10.5)
Chloride: 108 mEq/L (ref 96–112)
Creatinine, Ser: 1.06 mg/dL (ref 0.50–1.35)
GFR calc Af Amer: >60 mL/min (ref >60)
GFR calc non Af Amer: >60 mL/min (ref >60)
Glucose, Bld: 136 mg/dL — ABNORMAL HIGH (ref 70–99)
Potassium: 3.2 mEq/L — ABNORMAL LOW (ref 3.5–5.1)
Sodium: 137 mEq/L (ref 135–145)

## 2011-05-27 LAB — WOUND CULTURE

## 2011-05-27 LAB — LEGIONELLA ANTIGEN, URINE: Legionella Antigen, Urine: NEGATIVE

## 2011-05-27 LAB — GLUCOSE, CAPILLARY
Glucose-Capillary: 109 mg/dL — ABNORMAL HIGH (ref 70–99)
Glucose-Capillary: 117 mg/dL — ABNORMAL HIGH (ref 70–99)
Glucose-Capillary: 124 mg/dL — ABNORMAL HIGH (ref 70–99)

## 2011-05-27 LAB — MAGNESIUM: Magnesium: 1.4 mg/dL — ABNORMAL LOW (ref 1.5–2.5)

## 2011-05-28 DIAGNOSIS — R6521 Severe sepsis with septic shock: Secondary | ICD-10-CM

## 2011-05-28 DIAGNOSIS — N170 Acute kidney failure with tubular necrosis: Secondary | ICD-10-CM

## 2011-05-28 DIAGNOSIS — A419 Sepsis, unspecified organism: Secondary | ICD-10-CM

## 2011-05-28 LAB — GLUCOSE, CAPILLARY
Glucose-Capillary: 112 mg/dL — ABNORMAL HIGH (ref 70–99)
Glucose-Capillary: 100 mg/dL — ABNORMAL HIGH (ref 70–99)
Glucose-Capillary: 181 mg/dL — ABNORMAL HIGH (ref 70–99)

## 2011-05-29 LAB — CULTURE, BLOOD (ROUTINE X 2): Culture: NO GROWTH
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
## 2011-05-31 LAB — CULTURE, BLOOD (ROUTINE X 2): Culture: NO GROWTH

## 2011-05-31 NOTE — Discharge Summary (Addendum)
[name], [name] NO.:  [id number]  MEDICAL RECORD NO.:  [id number]  LOCATION:  5006                         FACILITY:  [facility]  PHYSICIAN:  [name] Milch, MD      DATE OF BIRTH:  07/23/1971  DATE OF ADMISSION:  05/24/2011 DATE OF DISCHARGE:  05/28/2011                              DISCHARGE SUMMARY   DISCHARGE DIAGNOSES: 1. Perirectal abscess with systemic inflammatory response syndrome. 2. Acute renal failure secondary to systemic inflammatory response     syndrome/sepsis. 3. Hypertension. 4. Hypomagnesemia.  LINES/TUBES:  None.  MICRODATA:  On May 24, 2011, blood cultures x2 demonstrate no growth on preliminary results, final results pending.  On May 25, 2011, urine culture demonstrates no growth.  On May 24, 2011, wound culture demonstrates multiple organisms present, non-predominant. Second set of blood cultures x2 on May 24, 2011, demonstrate no growth on preliminary results, final results are pending.  On May 25, 2011, urine Legionella is negative.  ANTIBIOTIC DATA:  The patient was placed on vancomycin from May 24, 2011 to May 28, 2011, also on Zosyn from May 24, 2011 to May 28, 2011 secondary to sepsis related to perirectal perineal abscess.  BEST PRACTICE:  The patient was placed on SCDs for DVT prophylaxis.  PROTOCOL/CONSULTANTS:  Dr. Violeta [name] for [facility].  KEY EVENTS/STUDIES:  CT scan of the abdomen on May 24, 2011 demonstrates a small amount of gas at gluteal cleft, question small sinus tract, distended appendix, chronic pancreatitis with evidence of questionable old pseudocyst.  LABORATORY DATA:  On May 27, 2011; CBC demonstrates WBC 7.2, hemoglobin 13.4, hematocrit 36.1, MCV 93.8, MCHC 37.1 with spherocytes noted, platelet count 315.  On May 27, 2011; magnesium 1.4. May 27, 2011; BMP demonstrates sodium 137, potassium 3.2, chloride 108, CO2  23, glucose 136, BUN 25, creatinine 1.06, and calcium 8.6.  RADIOLOGIC DATA:  On May 24, 2011; chest x-ray demonstrates low lung volumes with vascular crowding atelectasis, mild vascular congestion.  On May 25, 2011; CT of the abdomen and pelvis demonstrates small amount of gas just superior to the rectal tube within the region of the gluteal cleft consistent with small sinus tract.  No significant abscess identified, distended strained appendix associated with inflammatory change suspicious for acute appendicitis, changes consistent with chronic pancreatitis, 1.8 cm probable cystic lesion in the region of the uncinate process and represent old pseudocyst.  Chest x-ray on May 26, 2011, demonstrates mild left basilar atelectasis.  HISTORY OF PRESENT ILLNESS:  [name] is a 40 year old African American male with past medical history of insulin-dependent diabetes, hypertension, and pancreatitis in the past secondary to alcohol abuse, who presented to [facility] on May 24, 2011 with seventh day plus or minus history of left perirectal abscess.  He has been seen by his primary care provider and prescribed antibiotics.  He noted mild drainage from the abscess.  Two days prior to admission, however, he had a large amount of spontaneous drainage, since that time, he felt he was not getting any better, went to the [facility] Emergency Department on May 24, 2011.  He was seen and evaluated and noted to have a low  blood pressure and was transferred to [facility] for further care and evaluation in the setting of SIRS/sepsis in relation to perirectal abscess.  He was cultured during hospitalization and cultures to date are negative.  He did undergo a CT of the abdomen and pelvis which demonstrated a small amount of gas at the gluteal cleft and small sinus tract.  There was mention of acute appendicitis per Radiology.  However, this was reviewed by the surgeon and  radiologist and felt that the appendix was within normal limits.  He does have acute on chronic pancreatitis and positive pseudocyst.  [name] was evaluated by Dr. Janee [name] at [facility] during hospitalization and it was felt that he was not a surgical candidate at this time.  He was seen and evaluated by wound ostomy continence nursing staff during hospitalization and currently, the wound is 4 cm x 7 cm x 1 cm with tunneling at 12 o'clock approximately 2 cm, discussed that a gel dressing packed into this area will be best for moist wound healing.  He will need continued wound care and packing to this area at home.  He indicates that at this time that he does have an aunt is a retired Engineer, civil (consulting) that might be able to help with dressing changes at home.  [name] remained in the intensive care unit secondary to metabolic gap acidosis and evidence of sepsis without shock.  He was placed on IV vancomycin and Zosyn.  Again, he was evaluated by Surgery and it was felt that there was no role for debridement at this time.  Cultures were negative. He also was noted to have acute renal failure during hospitalization as well as hypokalemia and hypomagnesemia.  Electrolytes were monitored serially and repleted as needed.  Initial lactic acid was 1.0 with reduction to 0.8 after 24 hours of treatment.  He was transiently placed on a bicarbonate drip and discontinued after gap acidosis closed.  [name] is a known diabetic and was maintained on ICU hyperglycemia protocol.  His p.o. metformin was held during hospitalization and will be resumed at time of discharge.  At the time of discharge dictation, [name] is awake, alert and oriented with no evidence of acute distress.  His vital signs are currently heart rate 87, respirations 18, blood pressure 106/69 and is pending discharge to home with home health followup and follow up with his primary care provider in office for wound check  and follow up BMP for renal function and potassium.  HOSPITAL COURSE BY DISCHARGE DIAGNOSES: 1. Perirectal abscess with systemic inflammatory response     syndrome/sepsis.  As per history of present illness, [name] was     admitted on May 24, 2011 with spontaneous rupture of a     perirectal abscess.  He was seen and evaluated by Surgery and felt     that no debridement was necessary at time of admission.  However     secondary to hypotension and sepsis, he was admitted to the     intensive care unit.  He did require transient bicarbonate therapy     in the setting of gap acidosis.  He was treated with IV vancomycin     and Zosyn, and continued to make improvement.  He was transitioned     out of the intensive care unit on May 26, 2011 and has     clinically continued to improve.  His antibiotics were changed to     Augmentin at time of  discharge for 10 days total. 2. Acute renal failure secondary to systemic inflammatory response     syndrome/sepsis.  [name] is a known diabetic on an ACE     inhibitor prior to admission, ACE inhibitor was held on admit as     well as metformin secondary to acute renal failure.  Followup     laboratory data on May 27, 2011 demonstrates normal     creatinine.  However secondary to low normal blood pressures at     time of discharge, [name]'s ACE inhibitor will be held and this     will need to be reviewed for resomation at time of follow up with     primary care provider. 3. Hypertension.  [name] does have a known history of hypertension     on ACE inhibitor.  Again, please see renal section for discussion     regarding blood pressure medication resomation at time of follow up     with primary care provider. 4. Hypomagnesemia and hypokalemia.  [name] did have transient     electrolyte disturbances during hospitalization and these were     serially monitored and repleted as needed.  At time of discharge,     he will be  given supplemental magnesium for two more days and is     recommended to have a followup BMP for serum creatinine review and     potassium review with a magnesium check as well.  DISCHARGE INSTRUCTIONS: 1. Activity.  Has been instructed to increase activity slowly and as     tolerated.  He was recommended to shower with no bath at this time. 2. Diet.  Diabetic diet. 3. Wound Care.  Per home health care RN, again it was recommended by     wound ostomy consult during hospitalization.  He continue to be     packed with a gel dressing into this area for moist wound healing.     At time of discharge dictation, it was noted that he does have a     clean pink wound bed with granulation full-thickness tissue, wound     size is 4 cm x 7 cm x 1 cm with tunneling at 12 o'clock     approximately 2 cm.  He will need to continue to have this packed     at home with IntraSite gel into perirectal area daily and p.r.n.     secure with topper and mesh underwear.  FOLLOWUP:  [name] is scheduled to follow up with Dr. Fran Lowes at [facility] on June 01, 2011 at 10:15 a.m.  He will need to follow up BMP and magnesium for review.  DISCHARGE MEDICATIONS: 1. Augmentin 875 mg by mouth twice daily for total of 10 days. 2. Magnesium oxide 500 mg by mouth twice daily for 2 days. 3. Lantus 30 units subcutaneously daily at bedtime. 4. Metformin 500 mg by mouth twice daily with meals.  [name] has been instructed to stop taking the following medications: 1. Lisinopril 40 mg daily. 2. Ceftin 500 mg by mouth twice daily. 3. Flagyl 500 mg by mouth twice daily.  DISPOSITION AT TIME OF DISCHARGE:  [name] has met maximum benefit of inpatient therapy and is currently medically stable and cleared for discharge pending follow up with Dr. Fran Lowes at [facility]     Canary Brim, NP   ______________________________ [name] Milch, MD    BO/MEDQ  D:  05/28/2011  T:  05/28/2011  Job:  [id number]  cc:   Dr. Fran Lowes  Electronically Signed by Cyril Mourning MD on 05/31/2011 01:14:09 PM Electronically Signed by Canary Brim  on 06/04/2011 05:46:29 PM

## 2011-06-07 LAB — DRUG SCREEN PANEL (SERUM)

## 2011-06-12 LAB — DIFFERENTIAL
Basophils Absolute: 0.1
Basophils Absolute: 0.1
Basophils Relative: 0
Basophils Relative: 1
Eosinophils Absolute: 1.5 — ABNORMAL HIGH
Eosinophils Relative: 11 — ABNORMAL HIGH
Eosinophils Relative: 14 — ABNORMAL HIGH
Lymphocytes Relative: 24
Lymphocytes Relative: 6 — ABNORMAL LOW
Lymphs Abs: 1.8
Monocytes Absolute: 1.1 — ABNORMAL HIGH
Monocytes Absolute: 1.1 — ABNORMAL HIGH
Monocytes Absolute: 1.5 — ABNORMAL HIGH
Monocytes Relative: 11
Monocytes Relative: 13 — ABNORMAL HIGH
Monocytes Relative: 8
Neutro Abs: 15.1 — ABNORMAL HIGH
Neutro Abs: 5.4
Neutrophils Relative %: 85 — ABNORMAL HIGH

## 2011-06-12 LAB — CBC
HCT: 32.3 — ABNORMAL LOW
HCT: 47.9
Hemoglobin: 11.4 — ABNORMAL LOW
Hemoglobin: 12.3 — ABNORMAL LOW
Hemoglobin: 13.8
MCHC: 35.1
MCHC: 35.3
MCHC: 36
MCV: 98.3
Platelets: 136 — ABNORMAL LOW
Platelets: 152
RBC: 3.64 — ABNORMAL LOW
RBC: 4.12 — ABNORMAL LOW
RDW: 12.7
RDW: 12.7
RDW: 13.1

## 2011-06-12 LAB — BASIC METABOLIC PANEL
BUN: 5 — ABNORMAL LOW
BUN: 6
BUN: 7
CO2: 20
CO2: 21
CO2: 22
CO2: 23
Calcium: 8.8
Calcium: 8.8
Calcium: 9.3
Chloride: 101
Chloride: 103
Chloride: 105
Creatinine, Ser: 1.02
Creatinine, Ser: 1.36
GFR calc Af Amer: >60
GFR calc Af Amer: >60
GFR calc Af Amer: >60
GFR calc non Af Amer: >60
GFR calc non Af Amer: >60
Glucose, Bld: 138 — ABNORMAL HIGH
Glucose, Bld: 142 — ABNORMAL HIGH
Glucose, Bld: 173 — ABNORMAL HIGH
Glucose, Bld: 402 — ABNORMAL HIGH
Potassium: 2.5 — CL
Potassium: 3 — ABNORMAL LOW
Potassium: 3.1 — ABNORMAL LOW
Potassium: 3.1 — ABNORMAL LOW
Potassium: 3.1 — ABNORMAL LOW
Potassium: 3.7
Sodium: 128 — ABNORMAL LOW
Sodium: 130 — ABNORMAL LOW
Sodium: 130 — ABNORMAL LOW
Sodium: 131 — ABNORMAL LOW
Sodium: 132 — ABNORMAL LOW

## 2011-06-12 LAB — CULTURE, BLOOD (ROUTINE X 2)
Culture: NO GROWTH
Report Status: 4232009

## 2011-06-12 LAB — LIPID PANEL
Cholesterol: 295 — ABNORMAL HIGH
HDL: 31 — ABNORMAL LOW

## 2011-06-12 LAB — URINE CULTURE
Colony Count: NO GROWTH
Culture: NO GROWTH

## 2011-06-12 LAB — BLOOD GAS, ARTERIAL
Bicarbonate: 13.5 — ABNORMAL LOW
Bicarbonate: 6.9 — ABNORMAL LOW
FIO2: 0.21
Patient temperature: 37
TCO2: 12.1
pCO2 arterial: 25.9 — ABNORMAL LOW
pH, Arterial: 7.236 — ABNORMAL LOW
pH, Arterial: 7.337 — ABNORMAL LOW
pO2, Arterial: 95.9

## 2011-06-12 LAB — URINALYSIS, ROUTINE W REFLEX MICROSCOPIC
Glucose, UA: 500 — AB
Ketones, ur: 40 — AB
Leukocytes, UA: NEGATIVE
Leukocytes, UA: NEGATIVE
Nitrite: NEGATIVE
Nitrite: NEGATIVE
Protein, ur: 30 — AB
Specific Gravity, Urine: 1.02
Specific Gravity, Urine: 1.025
Urobilinogen, UA: 0.2
Urobilinogen, UA: 0.2
pH: 6.5

## 2011-06-12 LAB — URINE MICROSCOPIC-ADD ON

## 2011-06-12 LAB — COMPREHENSIVE METABOLIC PANEL
ALT: 17
AST: 16
Albumin: 3.8
Alkaline Phosphatase: 59
BUN: 19
Calcium: 9.7
Chloride: 100
Creatinine, Ser: 2.15 — ABNORMAL HIGH
GFR calc Af Amer: >60
Potassium: 3.5
Sodium: 132 — ABNORMAL LOW
Total Bilirubin: 1.8 — ABNORMAL HIGH
Total Protein: 5.9 — ABNORMAL LOW
Total Protein: 7.4

## 2011-06-12 LAB — HEMOGLOBIN A1C
Hgb A1c MFr Bld: 10.5 — ABNORMAL HIGH
Hgb A1c MFr Bld: 9.8 — ABNORMAL HIGH

## 2011-06-12 LAB — PROTIME-INR
INR: 1
Prothrombin Time: 13.1

## 2011-06-12 LAB — AMYLASE
Amylase: 147 — ABNORMAL HIGH
Amylase: 277 — ABNORMAL HIGH

## 2011-06-12 LAB — TSH: TSH: 1.959

## 2011-06-12 LAB — APTT: aPTT: 28

## 2011-06-12 LAB — PHOSPHORUS: Phosphorus: 5.3 — ABNORMAL HIGH

## 2011-06-12 LAB — KETONES, QUALITATIVE

## 2012-07-21 ENCOUNTER — Emergency Department (HOSPITAL_COMMUNITY)
Admission: EM | Admit: 2012-07-21 | Discharge: 2012-07-21 | Disposition: A | Discharge status: Home / Self Care | Payer: Medicare Other | Attending: Emergency Medicine | Admitting: Emergency Medicine

## 2012-07-21 ENCOUNTER — Encounter (HOSPITAL_COMMUNITY): Payer: Self-pay | Admitting: Emergency Medicine

## 2012-07-21 DIAGNOSIS — F17200 Nicotine dependence, unspecified, uncomplicated: Secondary | ICD-10-CM | POA: Insufficient documentation

## 2012-07-21 DIAGNOSIS — L0291 Cutaneous abscess, unspecified: Secondary | ICD-10-CM

## 2012-07-21 DIAGNOSIS — Z79899 Other long term (current) drug therapy: Secondary | ICD-10-CM | POA: Insufficient documentation

## 2012-07-21 DIAGNOSIS — L03211 Cellulitis of face: Secondary | ICD-10-CM | POA: Insufficient documentation

## 2012-07-21 DIAGNOSIS — L0201 Cutaneous abscess of face: Secondary | ICD-10-CM | POA: Insufficient documentation

## 2012-07-21 DIAGNOSIS — E119 Type 2 diabetes mellitus without complications: Secondary | ICD-10-CM | POA: Insufficient documentation

## 2012-07-21 MED ORDER — KETOROLAC TROMETHAMINE 10 MG PO TABS
10.0000 mg | ORAL_TABLET | Freq: Once | ORAL | Status: AC
  Administered 2012-07-21: 10 mg via ORAL
  Filled 2012-07-21: qty 1

## 2012-07-21 MED ORDER — CEFTRIAXONE SODIUM 1 G IJ SOLR
1.0000 g | INTRAMUSCULAR | Status: DC
  Administered 2012-07-21: 1 g via INTRAMUSCULAR
  Filled 2012-07-21: qty 10

## 2012-07-21 MED ORDER — HYDROCODONE-ACETAMINOPHEN 5-325 MG PO TABS: 1.0000 | ORAL_TABLET | ORAL | Status: DC | PRN

## 2012-07-21 MED ORDER — DOXYCYCLINE HYCLATE 100 MG PO CAPS: 100.0000 mg | ORAL_CAPSULE | Freq: Two times a day (BID) | ORAL | Status: DC

## 2012-07-21 MED ORDER — DOXYCYCLINE HYCLATE 100 MG PO TABS
100.0000 mg | ORAL_TABLET | Freq: Once | ORAL | Status: AC
  Administered 2012-07-21: 100 mg via ORAL

## 2012-07-21 MED ORDER — DOXYCYCLINE HYCLATE 100 MG PO TABS
ORAL_TABLET | ORAL | Status: AC
  Filled 2012-07-21: qty 1

## 2012-07-21 MED ORDER — AMOXICILLIN 500 MG PO CAPS: ORAL_CAPSULE | ORAL | Status: DC

## 2012-07-21 MED ORDER — LIDOCAINE HCL (PF) 1 % IJ SOLN
INTRAMUSCULAR | Status: AC
  Filled 2012-07-21: qty 5

## 2012-07-21 MED ORDER — ONDANSETRON HCL 4 MG PO TABS
4.0000 mg | ORAL_TABLET | Freq: Once | ORAL | Status: AC
  Administered 2012-07-21: 4 mg via ORAL
  Filled 2012-07-21: qty 1

## 2012-07-21 MED ORDER — HYDROCODONE-ACETAMINOPHEN 5-325 MG PO TABS
1.0000 | ORAL_TABLET | Freq: Once | ORAL | Status: AC
  Administered 2012-07-21: 1 via ORAL
  Filled 2012-07-21: qty 1

## 2012-07-21 NOTE — ED Notes (Signed)
Pt c/o abscess to left jaw x 1 month.

## 2012-07-21 NOTE — ED Provider Notes (Addendum)
History     CSN: [id number]  Arrival date & time 07/21/12  1451   None     Chief Complaint  Patient presents with  . Abscess    (Consider location/radiation/quality/duration/timing/severity/associated sxs/prior treatment) Patient is a 41 y.o. male presenting with abscess. The history is provided by the patient.  Abscess  This is a new problem. The current episode started more than one week ago. The onset was gradual. The problem occurs continuously. The problem has been gradually worsening. The abscess is present on the face. The problem is moderate. The abscess is characterized by painfulness. It is unknown what he was exposed to. Pertinent negatives include no anorexia, no fever, no vomiting and no cough. His past medical history does not include skin abscesses in family. There were no sick contacts. He has received no recent medical care.    Past Medical History  Diagnosis Date  . Diabetes mellitus     History reviewed. No pertinent past surgical history.  History reviewed. No pertinent family history.  History  Substance Use Topics  . Smoking status: Current Every Day Smoker  . Smokeless tobacco: Not on file  . Alcohol Use: Yes      Review of Systems  Constitutional: Negative for fever and activity change.       All ROS Neg except as noted in HPI  HENT: Negative for nosebleeds and neck pain.   Eyes: Negative for photophobia and discharge.  Respiratory: Negative for cough, shortness of breath and wheezing.   Cardiovascular: Negative for chest pain and palpitations.  Gastrointestinal: Negative for vomiting, abdominal pain, blood in stool and anorexia.  Genitourinary: Negative for dysuria, frequency and hematuria.  Musculoskeletal: Negative for back pain and arthralgias.  Skin: Negative.   Neurological: Negative for dizziness, seizures and speech difficulty.  Psychiatric/Behavioral: Negative for hallucinations and confusion.    Allergies  Review of patient's  allergies indicates no known allergies.  Home Medications   Current Outpatient Rx  Name  Route  Sig  Dispense  Refill  . METFORMIN HCL 500 MG PO TABS   Oral   Take 500 mg by mouth 2 (two) times daily with a meal.           . AMOXICILLIN 500 MG PO CAPS      2 po bid with food   28 capsule   0   . DOXYCYCLINE HYCLATE 100 MG PO CAPS   Oral   Take 1 capsule (100 mg total) by mouth 2 (two) times daily.   14 capsule   0   . HYDROCODONE-ACETAMINOPHEN 5-325 MG PO TABS   Oral   Take 1 tablet by mouth every 4 (four) hours as needed for pain.   20 tablet   0     BP 138/91  Pulse 92  Temp 98.8 F (37.1 C) (Oral)  Resp 20  Ht 6\' 1"  (1.854 m)  Wt 170 lb (77.111 kg)  BMI 22.43 kg/m2  SpO2 100%  Physical Exam  Nursing note and vitals reviewed. Constitutional: He is oriented to person, place, and time. He appears well-developed and well-nourished.  Non-toxic appearance.  HENT:  Head: Normocephalic.  Right Ear: Tympanic membrane and external ear normal.  Left Ear: Tympanic membrane and external ear normal.       Mod sized abscess of the right face (jaw line).  Eyes: EOM and lids are normal. Pupils are equal, round, and reactive to light.  Neck: Normal range of motion. Neck supple. Carotid bruit is not  present.  Cardiovascular: Normal rate, regular rhythm, normal heart sounds, intact distal pulses and normal pulses.   Pulmonary/Chest: Breath sounds normal. No respiratory distress.  Abdominal: Soft. Bowel sounds are normal. There is no tenderness. There is no guarding.  Musculoskeletal: Normal range of motion.  Lymphadenopathy:       Head (right side): No submandibular adenopathy present.       Head (left side): No submandibular adenopathy present.    He has no cervical adenopathy.  Neurological: He is alert and oriented to person, place, and time. He has normal strength. No cranial nerve deficit or sensory deficit.  Skin: Skin is warm and dry.  Psychiatric: He has a normal  mood and affect. His speech is normal.    ED Course  Procedures : ASPIRATION OF ABSCESS RIGHT CHEEK - patient identified by arm band. Duration for the procedure given by the patient. Procedural time out taken before aspiration of abscess of the right cheek carried out. The right cheek was painted with alcohol. It was then painted with chlorhexidine. Using sterile technique a small wheal was raised with 1% plain lidocaine. After adequate anesthetic, 9.3 mL of pus like material was removed from the abscess of the right cheek. With significant improvement in the abscessed area. Sterile bandage was applied by me. Culture was sent to the lab. The patient tolerated the procedure without problem.  Labs Reviewed - No data to display No results found.   1. Abscess       MDM  I have reviewed nursing notes, vital signs, and all appropriate lab and imaging results for this patient. Patient has an abscess of the right jaw that was aspirated while here in the emergency department. The patient is placed on Amoxil 1 g twice a day, and doxycycline 100 mg twice a day. Patient is also given prescription for Norco 5 mg every 4 hours if needed for pain. The patient was treated with Ceftin here in the emergency department. he is invited to return to the emergency department if any changes, problems or concerns. The patient was also given the name of the surgeon on-call for evaluation if this abscess returns.       Kathie Dike, PA 07/21/12 1639  Kathie Dike, PA 07/21/12 1655

## 2012-07-21 NOTE — ED Provider Notes (Signed)
Medical screening examination/treatment/procedure(s) were performed by non-physician practitioner and as supervising physician I was immediately available for consultation/collaboration.  Shelda Jakes, MD 07/21/12 [phone]

## 2012-07-21 NOTE — ED Notes (Signed)
Pt has swelling to rt side of mandible for 1 month.  Tender to palp.  Says it started as a "hair bump" and has gotten bigger and more painful.

## 2012-07-22 NOTE — ED Provider Notes (Signed)
Medical screening examination/treatment/procedure(s) were performed by non-physician practitioner and as supervising physician I was immediately available for consultation/collaboration.   Shelda Jakes, MD 07/22/12 1539

## 2012-07-24 LAB — CULTURE, ROUTINE-ABSCESS

## 2012-07-25 NOTE — ED Notes (Signed)
+  Abscess. Patient given amoxicillin and doxycycline. No sensitivities listed. Chart sent to EDP office for review.

## 2013-01-23 ENCOUNTER — Ambulatory Visit: Payer: Self-pay

## 2013-02-15 ENCOUNTER — Inpatient Hospital Stay (HOSPITAL_COMMUNITY)
Admission: EM | Admit: 2013-02-15 | Discharge: 2013-02-17 | DRG: 440 | Disposition: A | Discharge status: Home / Self Care | Payer: Medicare Other | Attending: Internal Medicine | Admitting: Internal Medicine

## 2013-02-15 ENCOUNTER — Encounter (HOSPITAL_COMMUNITY): Payer: Self-pay | Admitting: Emergency Medicine

## 2013-02-15 ENCOUNTER — Emergency Department (HOSPITAL_COMMUNITY): Payer: Medicare Other

## 2013-02-15 DIAGNOSIS — E876 Hypokalemia: Secondary | ICD-10-CM | POA: Diagnosis present

## 2013-02-15 DIAGNOSIS — K860 Alcohol-induced chronic pancreatitis: Secondary | ICD-10-CM

## 2013-02-15 DIAGNOSIS — Z720 Tobacco use: Secondary | ICD-10-CM | POA: Diagnosis present

## 2013-02-15 DIAGNOSIS — Z833 Family history of diabetes mellitus: Secondary | ICD-10-CM

## 2013-02-15 DIAGNOSIS — E1165 Type 2 diabetes mellitus with hyperglycemia: Secondary | ICD-10-CM | POA: Diagnosis present

## 2013-02-15 DIAGNOSIS — K869 Disease of pancreas, unspecified: Secondary | ICD-10-CM | POA: Diagnosis present

## 2013-02-15 DIAGNOSIS — R7989 Other specified abnormal findings of blood chemistry: Secondary | ICD-10-CM | POA: Diagnosis present

## 2013-02-15 DIAGNOSIS — R109 Unspecified abdominal pain: Secondary | ICD-10-CM

## 2013-02-15 DIAGNOSIS — K8689 Other specified diseases of pancreas: Secondary | ICD-10-CM

## 2013-02-15 DIAGNOSIS — E669 Obesity, unspecified: Secondary | ICD-10-CM

## 2013-02-15 DIAGNOSIS — K859 Acute pancreatitis without necrosis or infection, unspecified: Principal | ICD-10-CM | POA: Diagnosis present

## 2013-02-15 DIAGNOSIS — F10229 Alcohol dependence with intoxication, unspecified: Secondary | ICD-10-CM | POA: Diagnosis present

## 2013-02-15 DIAGNOSIS — I10 Essential (primary) hypertension: Secondary | ICD-10-CM | POA: Diagnosis present

## 2013-02-15 DIAGNOSIS — K219 Gastro-esophageal reflux disease without esophagitis: Secondary | ICD-10-CM

## 2013-02-15 DIAGNOSIS — F1210 Cannabis abuse, uncomplicated: Secondary | ICD-10-CM | POA: Diagnosis present

## 2013-02-15 DIAGNOSIS — IMO0002 Reserved for concepts with insufficient information to code with codable children: Secondary | ICD-10-CM | POA: Diagnosis present

## 2013-02-15 DIAGNOSIS — F1010 Alcohol abuse, uncomplicated: Secondary | ICD-10-CM | POA: Diagnosis present

## 2013-02-15 DIAGNOSIS — F17200 Nicotine dependence, unspecified, uncomplicated: Secondary | ICD-10-CM

## 2013-02-15 DIAGNOSIS — D7589 Other specified diseases of blood and blood-forming organs: Secondary | ICD-10-CM | POA: Diagnosis present

## 2013-02-15 DIAGNOSIS — K861 Other chronic pancreatitis: Secondary | ICD-10-CM | POA: Diagnosis present

## 2013-02-15 DIAGNOSIS — IMO0001 Reserved for inherently not codable concepts without codable children: Secondary | ICD-10-CM | POA: Diagnosis present

## 2013-02-15 DIAGNOSIS — E1110 Type 2 diabetes mellitus with ketoacidosis without coma: Secondary | ICD-10-CM

## 2013-02-15 HISTORY — DX: Essential (primary) hypertension: I10

## 2013-02-15 HISTORY — DX: Alcohol dependence, uncomplicated: F10.20

## 2013-02-15 HISTORY — DX: Other chronic pancreatitis: K86.1

## 2013-02-15 LAB — CBC WITH DIFFERENTIAL/PLATELET
Basophils Relative: 0 % (ref 0–1)
Eosinophils Absolute: 0 10*3/uL (ref 0.0–0.7)
Eosinophils Relative: 0 % (ref 0–5)
Hemoglobin: 14.5 g/dL (ref 13.0–17.0)
MCH: 36 pg — ABNORMAL HIGH (ref 26.0–34.0)
MCHC: 35 g/dL (ref 30.0–36.0)
MCV: 102.7 fL — ABNORMAL HIGH (ref 78.0–100.0)
Monocytes Absolute: 0.6 10*3/uL (ref 0.1–1.0)
Monocytes Relative: 7 % (ref 3–12)
Neutrophils Relative %: 80 % — ABNORMAL HIGH (ref 43–77)

## 2013-02-15 LAB — COMPREHENSIVE METABOLIC PANEL
Albumin: 4.2 g/dL (ref 3.5–5.2)
BUN: 6 mg/dL (ref 6–23)
Calcium: 9.1 mg/dL (ref 8.4–10.5)
Creatinine, Ser: 0.86 mg/dL (ref 0.50–1.35)
GFR calc Af Amer: >90 mL/min (ref >90)
Glucose, Bld: 186 mg/dL — ABNORMAL HIGH (ref 70–99)
Potassium: 3.2 mEq/L — ABNORMAL LOW (ref 3.5–5.1)
Total Protein: 7.6 g/dL (ref 6.0–8.3)

## 2013-02-15 LAB — LIPASE, BLOOD: Lipase: 16 U/L (ref 11–59)

## 2013-02-15 LAB — RAPID URINE DRUG SCREEN, HOSP PERFORMED
Amphetamines: NOT DETECTED
Benzodiazepines: NOT DETECTED
Cocaine: NOT DETECTED
Opiates: POSITIVE — AB

## 2013-02-15 MED ORDER — IOHEXOL 300 MG/ML  SOLN
50.0000 mL | Freq: Once | INTRAMUSCULAR | Status: AC | PRN
  Administered 2013-02-15: 50 mL via ORAL

## 2013-02-15 MED ORDER — SODIUM CHLORIDE 0.9 % IV SOLN
INTRAVENOUS | Status: DC
  Administered 2013-02-15: 18:00:00 via INTRAVENOUS

## 2013-02-15 MED ORDER — ONDANSETRON HCL 4 MG/2ML IJ SOLN
4.0000 mg | INTRAMUSCULAR | Status: AC | PRN
  Administered 2013-02-15 (×2): 4 mg via INTRAVENOUS
  Filled 2013-02-15 (×2): qty 2

## 2013-02-15 MED ORDER — SODIUM CHLORIDE 0.9 % IV SOLN: INTRAVENOUS | Status: DC

## 2013-02-15 MED ORDER — FAMOTIDINE IN NACL 20-0.9 MG/50ML-% IV SOLN
20.0000 mg | Freq: Once | INTRAVENOUS | Status: AC
  Administered 2013-02-15: 20 mg via INTRAVENOUS
  Filled 2013-02-15: qty 50

## 2013-02-15 MED ORDER — MORPHINE SULFATE 4 MG/ML IJ SOLN
4.0000 mg | INTRAMUSCULAR | Status: AC | PRN
  Administered 2013-02-15 (×2): 4 mg via INTRAVENOUS
  Filled 2013-02-15 (×2): qty 1

## 2013-02-15 MED ORDER — IOHEXOL 300 MG/ML  SOLN
100.0000 mL | Freq: Once | INTRAMUSCULAR | Status: AC | PRN
  Administered 2013-02-15: 100 mL via INTRAVENOUS

## 2013-02-15 MED ORDER — HYDROMORPHONE HCL PF 1 MG/ML IJ SOLN: 1.0000 mg | INTRAMUSCULAR | Status: DC | PRN

## 2013-02-15 MED ORDER — ONDANSETRON HCL 4 MG/2ML IJ SOLN: 4.0000 mg | Freq: Three times a day (TID) | INTRAMUSCULAR | Status: DC | PRN

## 2013-02-15 NOTE — ED Notes (Signed)
Patient reports still unable to obtain urine sample at this time. Stated, "I might can urinate after this pain gets under control."

## 2013-02-15 NOTE — ED Provider Notes (Signed)
History     CSN: [id number]  Arrival date & time 02/15/13  1655   First MD Initiated Contact with Patient 02/15/13 1716      Chief Complaint  Patient presents with  . Abdominal Pain     HPI Pt was seen at 1715.   Per pt, c/o gradual onset and persistence of constant upper abd "pain" since yesterday.  Has been associated with multiple intermittent episodes of N/V/D.  Describes the abd pain as "when my pancreatitis acts up."  States the pain began after he "had a couple of beers" yesterday and today.  Denies fevers, no back pain, no rash, no CP/SOB, no black or blood in stools or emesis.       Past Medical History  Diagnosis Date  . Diabetes mellitus   . Chronic pancreatitis   . Alcoholic     No past surgical history on file.    History  Substance Use Topics  . Smoking status: Current Every Day Smoker  . Smokeless tobacco: Not on file  . Alcohol Use: Yes      Review of Systems ROS: Statement: All systems negative except as marked or noted in the HPI; Constitutional: Negative for fever and chills. ; ; Eyes: Negative for eye pain, redness and discharge. ; ; ENMT: Negative for ear pain, hoarseness, nasal congestion, sinus pressure and sore throat. ; ; Cardiovascular: Negative for chest pain, palpitations, diaphoresis, dyspnea and peripheral edema. ; ; Respiratory: Negative for cough, wheezing and stridor. ; ; Gastrointestinal: +N/V/D, abd pain. Negative for blood in stool, hematemesis, jaundice and rectal bleeding. . ; ; Genitourinary: Negative for dysuria, flank pain and hematuria. ; ; Musculoskeletal: Negative for back pain and neck pain. Negative for swelling and trauma.; ; Skin: Negative for pruritus, rash, abrasions, blisters, bruising and skin lesion.; ; Neuro: Negative for headache, lightheadedness and neck stiffness. Negative for weakness, altered level of consciousness , altered mental status, extremity weakness, paresthesias, involuntary movement, seizure and syncope.         Allergies  Review of patient's allergies indicates no known allergies.  Home Medications   Current Outpatient Rx  Name  Route  Sig  Dispense  Refill  . metFORMIN (GLUCOPHAGE) 500 MG tablet   Oral   Take 500 mg by mouth 2 (two) times daily with a meal.             BP 136/84  Pulse 65  Temp(Src) 97.7 F (36.5 C) (Oral)  Resp 18  Ht 6' 1.5" (1.867 m)  Wt 165 lb (74.844 kg)  BMI 21.47 kg/m2  SpO2 100%  Physical Exam 1720: Physical examination:  Nursing notes reviewed; Vital signs and O2 SAT reviewed;  Constitutional: Well developed, Well nourished, Uncomfortable appearing; Head:  Normocephalic, atraumatic; Eyes: EOMI, PERRL, No scleral icterus; ENMT: Mouth and pharynx normal, Mucous membranes dry; Neck: Supple, Full range of motion, No lymphadenopathy; Cardiovascular: Regular rate and rhythm, No murmur, rub, or gallop; Respiratory: Breath sounds clear & equal bilaterally, No rales, rhonchi, wheezes.  Speaking full sentences with ease, Normal respiratory effort/excursion; Chest: Nontender, Movement normal; Abdomen: Soft, +mid-epigastric and LUQ areas tender to palp. No rebound or guarding. Nondistended, Normal bowel sounds; Genitourinary: No CVA tenderness; Extremities: Pulses normal, No tenderness, No edema, No calf edema or asymmetry.; Neuro: AA&Ox3, Major CN grossly intact.  Speech clear. Climbs on and off stretcher by himself. Gait steady. No gross focal motor or sensory deficits in extremities.; Skin: Color normal, Warm, Dry.   ED Course  Procedures  MDM  MDM Reviewed: previous chart, nursing note and vitals Reviewed previous: labs Interpretation: labs, x-ray and CT scan   Results for orders placed during the hospital encounter of 02/15/13  CBC WITH DIFFERENTIAL      Result Value Range   WBC 8.4  4.0 - 10.5 K/uL   RBC 4.03 (*) 4.22 - 5.81 MIL/uL   Hemoglobin 14.5  13.0 - 17.0 g/dL   HCT 04.5  40.9 - 81.1 %   MCV 102.7 (*) 78.0 - 100.0 fL   MCH 36.0 (*)  26.0 - 34.0 pg   MCHC 35.0  30.0 - 36.0 g/dL   RDW 91.4  78.2 - 95.6 %   Platelets 230  150 - 400 K/uL   Neutrophils Relative % 80 (*) 43 - 77 %   Neutro Abs 6.7  1.7 - 7.7 K/uL   Lymphocytes Relative 13  12 - 46 %   Lymphs Abs 1.1  0.7 - 4.0 K/uL   Monocytes Relative 7  3 - 12 %   Monocytes Absolute 0.6  0.1 - 1.0 K/uL   Eosinophils Relative 0  0 - 5 %   Eosinophils Absolute 0.0  0.0 - 0.7 K/uL   Basophils Relative 0  0 - 1 %   Basophils Absolute 0.0  0.0 - 0.1 K/uL  COMPREHENSIVE METABOLIC PANEL      Result Value Range   Sodium 136  135 - 145 mEq/L   Potassium 3.2 (*) 3.5 - 5.1 mEq/L   Chloride 97  96 - 112 mEq/L   CO2 24  19 - 32 mEq/L   Glucose, Bld 186 (*) 70 - 99 mg/dL   BUN 6  6 - 23 mg/dL   Creatinine, Ser 2.13  0.50 - 1.35 mg/dL   Calcium 9.1  8.4 - 08.6 mg/dL   Total Protein 7.6  6.0 - 8.3 g/dL   Albumin 4.2  3.5 - 5.2 g/dL   AST 578 (*) 0 - 37 U/L   ALT 92 (*) 0 - 53 U/L   Alkaline Phosphatase 138 (*) 39 - 117 U/L   Total Bilirubin 0.5  0.3 - 1.2 mg/dL   GFR calc non Af Amer >90  >90 mL/min   GFR calc Af Amer >90  >90 mL/min  LIPASE, BLOOD      Result Value Range   Lipase 16  11 - 59 U/L  URINE RAPID DRUG SCREEN (HOSP PERFORMED)      Result Value Range   Opiates POSITIVE (*) NONE DETECTED   Cocaine NONE DETECTED  NONE DETECTED   Benzodiazepines NONE DETECTED  NONE DETECTED   Amphetamines NONE DETECTED  NONE DETECTED   Tetrahydrocannabinol POSITIVE (*) NONE DETECTED   Barbiturates NONE DETECTED  NONE DETECTED  ETHANOL      Result Value Range   Alcohol, Ethyl (B) 163 (*) 0 - 11 mg/dL   Ct Abdomen Pelvis W Contrast 02/15/2013   *RADIOLOGY REPORT*  Clinical Data: Abdominal pain, history of pancreatitis  CT ABDOMEN AND PELVIS WITH CONTRAST  Technique:  Multidetector CT imaging of the abdomen and pelvis was performed following the standard protocol during bolus administration of intravenous contrast.  Contrast: 50mL OMNIPAQUE IOHEXOL 300 MG/ML  SOLN, OMNIPAQUE  IOHEXOL 300 MG/ML  SOLN  Comparison: 05/25/2011  Findings: Lung bases are clear.  Gallbladder distention noted without other CT evidence for acute cholecystitis.  No radiopaque renal calculus.  1 cm porta hepatis lymph node image 26 is unchanged when allowing for slice selection  differences and previous technique.  Adrenal glands, spleen, liver, and kidneys are normal.  Pancreatic calcifications are re- identified without peripancreatic ductal dilatation or surrounding stranding.  Immediately abutting the pancreatic head, there is a 3.8 x 3.3 by 3.6 cm mass measuring 67 HU.  There is no evidence for adjacent large vessel encasement or invasion.  No bowel wall thickening or focal segmental dilatation is identified.  Bladder is normal.  The appendix is normal, image 38. No ascites, retroperitoneal, or pelvic lymphadenopathy.  No acute osseous abnormality.  IMPRESSION: Mass lesion abutting the head of the pancreas with evidence of chronic pancreatitis but no peripancreatic fluid collection or stranding or pancreatic ductal dilatation.  Differential considerations include right lung related to pancreatitis given the clinical history, or a solid mass.  If the patient has evidence for active pancreatitis, further imaging would be recommended on a non emergent basis as an outpatient, after resolution of the patient's acute symptoms, specifically abdominal MRI with contrast according to pancreatic mass protocol.   Original Report Authenticated By: Christiana Pellant, M.D.    Dg Abd Acute W/chest 02/15/2013   *RADIOLOGY REPORT*  Clinical Data: Abdominal pain.  Nausea.  ACUTE ABDOMEN SERIES (ABDOMEN 2 VIEW & CHEST 1 VIEW)  Comparison: Chest radiographs 05/26/2011 and 05/25/2011.  Abdominal CT 05/25/2011.  Findings: The heart size and mediastinal contours are normal. The lungs are clear. There is no pleural effusion or pneumothorax. No acute osseous findings are identified.  Supine view abdomen demonstrates no significant bowel  distension. However, on the erect view, there are several air-fluid levels within the right mid abdomen which are probably within small bowel. Calcifications of chronic pancreatitis are noted.  There is no free intraperitoneal air or other suspicious calcification.  Spurring of the left iliac crest is unchanged.  IMPRESSION: Nonspecific bowel gas pattern with scattered air fluid levels within nondistended bowel.  This may represent enteritis.  If the patient has obstructive symptoms, follow-up or CT should be considered.   Original Report Authenticated By: Carey Bullocks, M.D.    Results for MCKYLE, SOLANKI (MRN [id number]) as of 02/15/2013 22:13  Ref. Range 01/03/2008 11:45 01/04/2008 06:55 11/16/2010 06:24 02/15/2013 17:47  AST Latest Range: 0-37 U/L 20 16 21  136 (H)  ALT Latest Range: 0-53 U/L 24 17 22  92 (H)     2205:  Pt continues to c/o pain despite multiple doses of IV pain meds. LFT's elevated today compared to previous.  Dx and testing d/w pt.  Questions answered.  Verb understanding, agreeable to observation admit.  T/C to [facility] Dr. Orvan Falconer, case discussed, including:  HPI, pertinent PM/SHx, VS/PE, dx testing, ED course and treatment:  Agreeable to observation admit, requests to write temporary orders, obtain medical bed to team 1.       Laray Anger, DO 02/17/13 1914
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
## 2013-02-15 NOTE — ED Notes (Signed)
States that he drank a beer and started having left sided abdominal pain, states it feels like his pancreatitis is flared up again.

## 2013-02-15 NOTE — ED Notes (Addendum)
Patient requsting more pain medication at this time. Reports pain did not decrease at all after two doses of prn Morphine 4 mg. Dr. Clarene Duke notified.

## 2013-02-16 ENCOUNTER — Encounter (HOSPITAL_COMMUNITY): Payer: Self-pay | Admitting: Internal Medicine

## 2013-02-16 DIAGNOSIS — F1210 Cannabis abuse, uncomplicated: Secondary | ICD-10-CM

## 2013-02-16 DIAGNOSIS — K869 Disease of pancreas, unspecified: Secondary | ICD-10-CM

## 2013-02-16 DIAGNOSIS — E1165 Type 2 diabetes mellitus with hyperglycemia: Secondary | ICD-10-CM | POA: Diagnosis present

## 2013-02-16 DIAGNOSIS — IMO0002 Reserved for concepts with insufficient information to code with codable children: Secondary | ICD-10-CM | POA: Diagnosis present

## 2013-02-16 DIAGNOSIS — E876 Hypokalemia: Secondary | ICD-10-CM | POA: Diagnosis present

## 2013-02-16 DIAGNOSIS — K859 Acute pancreatitis without necrosis or infection, unspecified: Secondary | ICD-10-CM

## 2013-02-16 DIAGNOSIS — R109 Unspecified abdominal pain: Secondary | ICD-10-CM

## 2013-02-16 DIAGNOSIS — K860 Alcohol-induced chronic pancreatitis: Secondary | ICD-10-CM | POA: Diagnosis present

## 2013-02-16 DIAGNOSIS — K8689 Other specified diseases of pancreas: Secondary | ICD-10-CM | POA: Diagnosis present

## 2013-02-16 DIAGNOSIS — R7989 Other specified abnormal findings of blood chemistry: Secondary | ICD-10-CM

## 2013-02-16 LAB — GLUCOSE, CAPILLARY
Glucose-Capillary: 97 mg/dL (ref 70–99)
Glucose-Capillary: 111 mg/dL — ABNORMAL HIGH (ref 70–99)

## 2013-02-16 LAB — URINE MICROSCOPIC-ADD ON

## 2013-02-16 LAB — URINALYSIS, ROUTINE W REFLEX MICROSCOPIC
Glucose, UA: NEGATIVE mg/dL
Ketones, ur: NEGATIVE mg/dL
Leukocytes, UA: NEGATIVE
Nitrite: POSITIVE — AB

## 2013-02-16 LAB — COMPREHENSIVE METABOLIC PANEL
Albumin: 3.8 g/dL (ref 3.5–5.2)
Alkaline Phosphatase: 126 U/L — ABNORMAL HIGH (ref 39–117)
BUN: 4 mg/dL — ABNORMAL LOW (ref 6–23)
Calcium: 8.6 mg/dL (ref 8.4–10.5)
Potassium: 3.9 mEq/L (ref 3.5–5.1)
Total Protein: 6.8 g/dL (ref 6.0–8.3)

## 2013-02-16 LAB — TSH: TSH: 2.915 u[IU]/mL (ref 0.350–4.500)

## 2013-02-16 LAB — CBC
HCT: 38.1 % — ABNORMAL LOW (ref 39.0–52.0)
MCH: 35.9 pg — ABNORMAL HIGH (ref 26.0–34.0)
MCHC: 34.9 g/dL (ref 30.0–36.0)
RDW: 12.5 % (ref 11.5–15.5)

## 2013-02-16 LAB — HEMOGLOBIN A1C: Mean Plasma Glucose: 120 mg/dL — ABNORMAL HIGH (ref <117)

## 2013-02-16 LAB — PROTIME-INR: INR: 1.15 (ref 0.00–1.49)

## 2013-02-16 MED ORDER — LORAZEPAM 2 MG/ML IJ SOLN: 0.0000 mg | Freq: Four times a day (QID) | INTRAMUSCULAR | Status: DC

## 2013-02-16 MED ORDER — FLEET ENEMA 7-19 GM/118ML RE ENEM: 1.0000 | ENEMA | Freq: Once | RECTAL | Status: AC | PRN

## 2013-02-16 MED ORDER — NICOTINE 14 MG/24HR TD PT24
14.0000 mg | MEDICATED_PATCH | TRANSDERMAL | Status: DC
  Administered 2013-02-16: 14 mg via TRANSDERMAL
  Filled 2013-02-16: qty 1

## 2013-02-16 MED ORDER — ACETAMINOPHEN 325 MG PO TABS: 650.0000 mg | ORAL_TABLET | Freq: Four times a day (QID) | ORAL | Status: DC | PRN

## 2013-02-16 MED ORDER — SODIUM CHLORIDE 0.9 % IV BOLUS (SEPSIS)
1000.0000 mL | Freq: Once | INTRAVENOUS | Status: AC
  Administered 2013-02-16: 1000 mL via INTRAVENOUS

## 2013-02-16 MED ORDER — POTASSIUM CHLORIDE 10 MEQ/100ML IV SOLN
10.0000 meq | INTRAVENOUS | Status: AC
  Administered 2013-02-16 (×4): 10 meq via INTRAVENOUS
  Filled 2013-02-16 (×2): qty 200

## 2013-02-16 MED ORDER — HYDRALAZINE HCL 20 MG/ML IJ SOLN
5.0000 mg | INTRAMUSCULAR | Status: DC | PRN
  Administered 2013-02-16: 5 mg via INTRAVENOUS
  Filled 2013-02-16: qty 1

## 2013-02-16 MED ORDER — THIAMINE HCL 100 MG/ML IJ SOLN
100.0000 mg | Freq: Every day | INTRAMUSCULAR | Status: DC
  Administered 2013-02-16: 100 mg via INTRAVENOUS
  Filled 2013-02-16: qty 2

## 2013-02-16 MED ORDER — POTASSIUM CHLORIDE IN NACL 20-0.9 MEQ/L-% IV SOLN
INTRAVENOUS | Status: DC
  Administered 2013-02-16 – 2013-02-17 (×2): via INTRAVENOUS

## 2013-02-16 MED ORDER — OXYCODONE HCL 5 MG PO TABS
5.0000 mg | ORAL_TABLET | ORAL | Status: DC | PRN
  Administered 2013-02-16 – 2013-02-17 (×5): 5 mg via ORAL
  Filled 2013-02-16 (×5): qty 1

## 2013-02-16 MED ORDER — ONDANSETRON HCL 4 MG PO TABS: 4.0000 mg | ORAL_TABLET | Freq: Four times a day (QID) | ORAL | Status: DC | PRN

## 2013-02-16 MED ORDER — ONDANSETRON HCL 4 MG/2ML IJ SOLN: 4.0000 mg | Freq: Four times a day (QID) | INTRAMUSCULAR | Status: DC | PRN

## 2013-02-16 MED ORDER — LORAZEPAM 2 MG/ML IJ SOLN: 1.0000 mg | Freq: Four times a day (QID) | INTRAMUSCULAR | Status: DC | PRN

## 2013-02-16 MED ORDER — LORAZEPAM 2 MG/ML IJ SOLN
1.0000 mg | Freq: Once | INTRAMUSCULAR | Status: AC
  Administered 2013-02-16: 1 mg via INTRAVENOUS
  Filled 2013-02-16: qty 1

## 2013-02-16 MED ORDER — INSULIN ASPART 100 UNIT/ML ~~LOC~~ SOLN
0.0000 [IU] | SUBCUTANEOUS | Status: DC
  Administered 2013-02-16: 2 [IU] via SUBCUTANEOUS
  Administered 2013-02-16: 1 [IU] via SUBCUTANEOUS

## 2013-02-16 MED ORDER — LORAZEPAM 2 MG/ML IJ SOLN: 0.0000 mg | Freq: Two times a day (BID) | INTRAMUSCULAR | Status: DC

## 2013-02-16 MED ORDER — LORAZEPAM 1 MG PO TABS: 1.0000 mg | ORAL_TABLET | Freq: Four times a day (QID) | ORAL | Status: DC | PRN

## 2013-02-16 MED ORDER — ENOXAPARIN SODIUM 40 MG/0.4ML ~~LOC~~ SOLN
40.0000 mg | SUBCUTANEOUS | Status: DC
  Administered 2013-02-16 (×2): 40 mg via SUBCUTANEOUS
  Filled 2013-02-16 (×2): qty 0.4

## 2013-02-16 MED ORDER — MAGNESIUM SULFATE 40 MG/ML IJ SOLN
2.0000 g | Freq: Once | INTRAMUSCULAR | Status: AC
  Administered 2013-02-16: 2 g via INTRAVENOUS
  Filled 2013-02-16: qty 50

## 2013-02-16 MED ORDER — ACETAMINOPHEN 650 MG RE SUPP: 650.0000 mg | Freq: Four times a day (QID) | RECTAL | Status: DC | PRN

## 2013-02-16 MED ORDER — VITAMIN B-1 100 MG PO TABS
100.0000 mg | ORAL_TABLET | Freq: Every day | ORAL | Status: DC
  Administered 2013-02-17: 100 mg via ORAL
  Filled 2013-02-16: qty 1

## 2013-02-16 MED ORDER — SODIUM CHLORIDE 0.9 % IV SOLN
INTRAVENOUS | Status: DC
  Administered 2013-02-16: via INTRAVENOUS
  Filled 2013-02-16 (×2): qty 1000

## 2013-02-16 MED ORDER — BISACODYL 10 MG RE SUPP: 10.0000 mg | Freq: Every day | RECTAL | Status: DC | PRN

## 2013-02-16 MED ORDER — HYDROMORPHONE HCL PF 1 MG/ML IJ SOLN
0.5000 mg | INTRAMUSCULAR | Status: DC | PRN
  Administered 2013-02-16 – 2013-02-17 (×13): 1 mg via INTRAVENOUS
  Filled 2013-02-16 (×13): qty 1

## 2013-02-16 NOTE — Progress Notes (Signed)
[name] FAO:[id number] DOB: Apr 21, 1971 DOA: 02/15/2013 PCP: No PCP Per Patient   Subjective: This man still feels epigastric pain since admission. Lipase is not elevated. He does appear to have some sort of mass near the pancreas.           Physical Exam: Blood pressure 157/90, pulse 51, temperature 97.4 F (36.3 C), temperature source Oral, resp. rate 16, height 6' 1.5" (1.867 m), weight 74.844 kg (165 lb), SpO2 100.00%. Looks systemically well. He does not appear to be in any obvious pain. Abdomen is soft and mildly tender in the epigastric area. Bowel sounds are heard. He is alert and orientated.   Investigations:  No results found for this or any previous visit (from the past 240 hour(s)).   Basic Metabolic Panel:  Recent Labs  69/62/95 1747 02/16/13 0421  NA 136 138  K 3.2* 3.9  CL 97 102  CO2 24 25  GLUCOSE 186* 119*  BUN 6 4*  CREATININE 0.86 0.80  CALCIUM 9.1 8.6  MG  --  2.3   Liver Function Tests:  Recent Labs  02/15/13 1747 02/16/13 0421  AST 136* 53*  ALT 92* 67*  ALKPHOS 138* 126*  BILITOT 0.5 0.7  PROT 7.6 6.8  ALBUMIN 4.2 3.8     CBC:  Recent Labs  02/15/13 1747 02/16/13 0421  WBC 8.4 10.8*  NEUTROABS 6.7  --   HGB 14.5 13.3  HCT 41.4 38.1*  MCV 102.7* 103.0*  PLT 230 220    Ct Abdomen Pelvis W Contrast  02/15/2013   *RADIOLOGY REPORT*  Clinical Data: Abdominal pain, history of pancreatitis  CT ABDOMEN AND PELVIS WITH CONTRAST  Technique:  Multidetector CT imaging of the abdomen and pelvis was performed following the standard protocol during bolus administration of intravenous contrast.  Contrast: 50mL OMNIPAQUE IOHEXOL 300 MG/ML  SOLN, OMNIPAQUE IOHEXOL 300 MG/ML  SOLN  Comparison: 05/25/2011  Findings: Lung bases are clear.  Gallbladder distention noted without other CT evidence for acute cholecystitis.  No radiopaque renal calculus.  1 cm porta hepatis lymph node image 26 is unchanged when allowing for slice selection  differences and previous technique.  Adrenal glands, spleen, liver, and kidneys are normal.  Pancreatic calcifications are re- identified without peripancreatic ductal dilatation or surrounding stranding.  Immediately abutting the pancreatic head, there is a 3.8 x 3.3 by 3.6 cm mass measuring 67 HU.  There is no evidence for adjacent large vessel encasement or invasion.  No bowel wall thickening or focal segmental dilatation is identified.  Bladder is normal.  The appendix is normal, image 38. No ascites, retroperitoneal, or pelvic lymphadenopathy.  No acute osseous abnormality.  IMPRESSION: Mass lesion abutting the head of the pancreas with evidence of chronic pancreatitis but no peripancreatic fluid collection or stranding or pancreatic ductal dilatation.  Differential considerations include right lung related to pancreatitis given the clinical history, or a solid mass.  If the patient has evidence for active pancreatitis, further imaging would be recommended on a non emergent basis as an outpatient, after resolution of the patient's acute symptoms, specifically abdominal MRI with contrast according to pancreatic mass protocol.   Original Report Authenticated By: Christiana Pellant, M.D.   Dg Abd Acute W/chest  02/15/2013   *RADIOLOGY REPORT*  Clinical Data: Abdominal pain.  Nausea.  ACUTE ABDOMEN SERIES (ABDOMEN 2 VIEW & CHEST 1 VIEW)  Comparison: Chest radiographs 05/26/2011 and 05/25/2011.  Abdominal CT 05/25/2011.  Findings: The heart size and mediastinal contours are normal. The  lungs are clear. There is no pleural effusion or pneumothorax. No acute osseous findings are identified.  Supine view abdomen demonstrates no significant bowel distension. However, on the erect view, there are several air-fluid levels within the right mid abdomen which are probably within small bowel. Calcifications of chronic pancreatitis are noted.  There is no free intraperitoneal air or other suspicious calcification.  Spurring of  the left iliac crest is unchanged.  IMPRESSION: Nonspecific bowel gas pattern with scattered air fluid levels within nondistended bowel.  This may represent enteritis.  If the patient has obstructive symptoms, follow-up or CT should be considered.   Original Report Authenticated By: Carey Bullocks, M.D.      Medications: I have reviewed the patient's current medications.  Impression: 1. Chronic pancreatitis. 2. Mass at the head of the pancreas, unclear etiology. 3. Ongoing alcohol abuse. 4. Marijuana abuse. 5. Elevated LFTs likely from alcohol abuse. 6. Macrocytosis without anemia.    Plan: 1. Continue with IV fluids and he can have ice chips. 2. Intravenous analgesia as required for the time being. 3. Check B12 and folate levels. 4. Appreciate gastroenterology recommendations.  Consultants:  Gastroenterology, Dr. Kendell Bane.   Procedures:  None.   Antibiotics:  None.                   Code Status: Full code.  Family Communication: Discuss plan with patient at the bedside.   Disposition Plan: Home when medically stable.  Time spent: 30 minutes.   LOS: 1 day   Wilson Singer Pager [phone]  02/16/2013, 11:12 AM

## 2013-02-16 NOTE — Progress Notes (Signed)
Utilization Review Complete  
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
## 2013-02-16 NOTE — Progress Notes (Signed)
Pt's BP is 172/101, HR is 50. Dr. Karilyn Cota notified, awaiting return call. Will continue to monitor.
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
## 2013-02-17 LAB — CBC
HCT: 36.4 % — ABNORMAL LOW (ref 39.0–52.0)
Hemoglobin: 13.1 g/dL (ref 13.0–17.0)
MCH: 36.5 pg — ABNORMAL HIGH (ref 26.0–34.0)
MCHC: 36 g/dL (ref 30.0–36.0)
MCV: 101.4 fL — ABNORMAL HIGH (ref 78.0–100.0)
RBC: 3.59 MIL/uL — ABNORMAL LOW (ref 4.22–5.81)

## 2013-02-17 LAB — GLUCOSE, CAPILLARY
Glucose-Capillary: 101 mg/dL — ABNORMAL HIGH (ref 70–99)
Glucose-Capillary: 120 mg/dL — ABNORMAL HIGH (ref 70–99)

## 2013-02-17 LAB — COMPREHENSIVE METABOLIC PANEL
ALT: 46 U/L (ref 0–53)
AST: 25 U/L (ref 0–37)
Albumin: 3.7 g/dL (ref 3.5–5.2)
CO2: 27 mEq/L (ref 19–32)
Chloride: 98 mEq/L (ref 96–112)
GFR calc non Af Amer: >90 mL/min (ref >90)
Potassium: 4.2 mEq/L (ref 3.5–5.1)
Sodium: 135 mEq/L (ref 135–145)
Total Bilirubin: 1.5 mg/dL — ABNORMAL HIGH (ref 0.3–1.2)

## 2013-02-17 MED ORDER — PANCRELIPASE (LIP-PROT-AMYL) 12000-38000 UNITS PO CPEP: 3.0000 | ORAL_CAPSULE | Freq: Three times a day (TID) | ORAL | Status: DC

## 2013-02-17 MED ORDER — THIAMINE HCL 100 MG PO TABS: 100.0000 mg | ORAL_TABLET | Freq: Every day | ORAL | Status: DC

## 2013-02-17 MED ORDER — PANTOPRAZOLE SODIUM 40 MG PO TBEC
40.0000 mg | DELAYED_RELEASE_TABLET | Freq: Every day | ORAL | Status: DC
  Administered 2013-02-17: 40 mg via ORAL
  Filled 2013-02-17: qty 1

## 2013-02-17 MED ORDER — OXYCODONE HCL 5 MG PO TABS
5.0000 mg | ORAL_TABLET | ORAL | Status: DC | PRN
Start: 2013-02-17 — End: 2013-02-26

## 2013-02-17 NOTE — Progress Notes (Signed)
REVIEWED.  2009: 249 LBS AND NOW PT WEIGHS 165 LBS. EUS OP WITH DR. Dulce Sellar IN 4-6 WEEKS.

## 2013-02-17 NOTE — Progress Notes (Signed)
Subjective: No N/V, abdominal pain better but states now worse in his back. Wants clear liquid diet. Wasn't advanced yesterday. Overall improved. Hungry.   Objective: Vital signs in last 24 hours: Temp:  [97.3 F (36.3 C)-98.3 F (36.8 C)] 98.3 F (36.8 C) (06/03 0538) Pulse Rate:  [47-69] 56 (06/03 0538) Resp:  [16-18] 18 (06/03 0538) BP: (144-172)/(89-101) 155/91 mmHg (06/03 0538) SpO2:  [98 %-100 %] 100 % (06/03 0538) Weight:  [162 lb 11.2 oz (73.8 kg)] 162 lb 11.2 oz (73.8 kg) (06/03 0538) Last BM Date: 02/15/13 General:   Alert and oriented, pleasant Heart:  S1, S2 present, no murmurs noted.  Lungs: Clear to auscultation bilaterally, without wheezing, rales, or rhonchi.  Abdomen:  Bowel sounds present, soft, mild TTP upper abdomen, non-distended. No HSM or hernias noted.  Extremities:  Without clubbing or edema. Neurologic:  Alert and  oriented x4;  grossly normal neurologically. Psych:  Alert and cooperative. Normal mood and affect.  Intake/Output from previous day: 06/02 0701 - 06/03 0700 In: 1254.2 [I.V.:1254.2] Out: 1350 [Urine:1350] Intake/Output this shift:    Lab Results:  Recent Labs  02/15/13 1747 02/16/13 0421 02/17/13 0547  WBC 8.4 10.8* 9.2  HGB 14.5 13.3 13.1  HCT 41.4 38.1* 36.4*  PLT 230 220 183   BMET  Recent Labs  02/15/13 1747 02/16/13 0421 02/17/13 0547  NA 136 138 135  K 3.2* 3.9 4.2  CL 97 102 98  CO2 24 25 27   GLUCOSE 186* 119* 118*  BUN 6 4* 4*  CREATININE 0.86 0.80 0.79  CALCIUM 9.1 8.6 9.4   LFT  Recent Labs  02/15/13 1747 02/16/13 0421 02/17/13 0547  PROT 7.6 6.8 6.8  ALBUMIN 4.2 3.8 3.7  AST 136* 53* 25  ALT 92* 67* 46  ALKPHOS 138* 126* 123*  BILITOT 0.5 0.7 1.5*   PT/INR  Recent Labs  02/16/13 0421  LABPROT 14.5  INR 1.15     Studies/Results: Ct Abdomen Pelvis W Contrast  02/15/2013   *RADIOLOGY REPORT*  Clinical Data: Abdominal pain, history of pancreatitis  CT ABDOMEN AND PELVIS WITH CONTRAST   Technique:  Multidetector CT imaging of the abdomen and pelvis was performed following the standard protocol during bolus administration of intravenous contrast.  Contrast: 50mL OMNIPAQUE IOHEXOL 300 MG/ML  SOLN, OMNIPAQUE IOHEXOL 300 MG/ML  SOLN  Comparison: 05/25/2011  Findings: Lung bases are clear.  Gallbladder distention noted without other CT evidence for acute cholecystitis.  No radiopaque renal calculus.  1 cm porta hepatis lymph node image 26 is unchanged when allowing for slice selection differences and previous technique.  Adrenal glands, spleen, liver, and kidneys are normal.  Pancreatic calcifications are re- identified without peripancreatic ductal dilatation or surrounding stranding.  Immediately abutting the pancreatic head, there is a 3.8 x 3.3 by 3.6 cm mass measuring 67 HU.  There is no evidence for adjacent large vessel encasement or invasion.  No bowel wall thickening or focal segmental dilatation is identified.  Bladder is normal.  The appendix is normal, image 38. No ascites, retroperitoneal, or pelvic lymphadenopathy.  No acute osseous abnormality.  IMPRESSION: Mass lesion abutting the head of the pancreas with evidence of chronic pancreatitis but no peripancreatic fluid collection or stranding or pancreatic ductal dilatation.  Differential considerations include right lung related to pancreatitis given the clinical history, or a solid mass.  If the patient has evidence for active pancreatitis, further imaging would be recommended on a non emergent basis as an outpatient, after resolution of  the patient's acute symptoms, specifically abdominal MRI with contrast according to pancreatic mass protocol.   Original Report Authenticated By: Christiana Pellant, M.D.   Dg Abd Acute W/chest  02/15/2013   *RADIOLOGY REPORT*  Clinical Data: Abdominal pain.  Nausea.  ACUTE ABDOMEN SERIES (ABDOMEN 2 VIEW & CHEST 1 VIEW)  Comparison: Chest radiographs 05/26/2011 and 05/25/2011.  Abdominal CT  05/25/2011.  Findings: The heart size and mediastinal contours are normal. The lungs are clear. There is no pleural effusion or pneumothorax. No acute osseous findings are identified.  Supine view abdomen demonstrates no significant bowel distension. However, on the erect view, there are several air-fluid levels within the right mid abdomen which are probably within small bowel. Calcifications of chronic pancreatitis are noted.  There is no free intraperitoneal air or other suspicious calcification.  Spurring of the left iliac crest is unchanged.  IMPRESSION: Nonspecific bowel gas pattern with scattered air fluid levels within nondistended bowel.  This may represent enteritis.  If the patient has obstructive symptoms, follow-up or CT should be considered.   Original Report Authenticated By: Carey Bullocks, M.D.    Assessment: 42 year old admitted with acute epigastric pain, N/V, with normal lipase and CT findings of chronic ETOH pancreatitis and a mass abutting the head of the pancreas. Overall, patient has clinically improved and would like to advance his diet. Will start clear liquids today with the addition of pancreatic enzymes, weight-based dosing. Needs EUS about 4-6 weeks from now, our office to arrange.    Plan: Start clear liquid diet Add PPI daily Creon (minimum starting dose is 500 units/kg per meal). Will start at 3 capsules with meals (36,000) and 2 with snacks (2 capsules). This leaves room for increase as needed EUS as outpatient in 4-6 weeks, our office to arrange   Nira Retort, ANP-BC [facility]     LOS: 2 days    02/17/2013, 7:51 AM

## 2013-02-17 NOTE — Progress Notes (Signed)
Pt discharged home today per Dr. Karilyn Cota. Pt's IV site D/C'd and WNL. Pt's VS stable at this time. Pt provided with home medication list, discharge instructions and prescriptions. Pt made aware of F/U appointment in place at Dr. Luvenia Starch office with Tana Coast, PA for July 1st at 8:30 am. Pt verbalized understanding of all of the above. Pt left floor in stable condition, ambulatory, with RN.

## 2013-02-17 NOTE — Discharge Summary (Signed)
Physician Discharge Summary  [name] YNW:[id number] DOB: 1971-06-18 DOA: 02/15/2013  PCP: No PCP Per Patient  Admit date: 02/15/2013 Discharge date: 02/17/2013  Time spent: Greater than 30 minutes  Recommendations for Outpatient Follow-up:  1. Follow with gastroenterology, Dr. Kendell Bane.   Discharge Diagnoses:  1. Chronic alcoholic pancreatitis. 2. Alcohol abuse. 3. Marijuana abuse.    Discharge Condition: Stable and improved.  Diet recommendation: No alcohol. Regular.  Filed Weights   02/15/13 1706 02/17/13 0538  Weight: 74.844 kg (165 lb) 73.8 kg (162 lb 11.2 oz)    History of present illness:  This 42 year old man presents to the hospital with symptoms of epigastric pain radiating to the back. Please see initial history as outlined below: [name] is an 42 y.o. male. African American gentleman with known history of alcohol abuse chronic pancreatitis with recurrent episodes of pancreatitis, presents to the emergency room because of severe epigastric pain radiating to his back since this morning. The patient typically drinks about 4 x 24 ounce beers per day but reports that he's been drinking much more yesterday and last night and also had some shots of loquor.  In the emergency room he was found to be intoxicated, his pain could not be controlled, a CT scan of the chest was done and an abnormal mass was found to be near the pancreas; unclear whether it's actually a pancreatic mass  He has had one episode of vomiting today  the past 3 months she's been having 2 loose stools per day,  he had progressive weight loss over the years, and in 2010 his problem list included in the diagnosis, "obesity"  Self Rx of skin rash with OTC cortisone  Hospital Course:  Patient's lipase actually was normal on admission. However CT scan findings suggested a mass near the pancreas. This was reviewed by Dr. Kendell Bane, gastroenterology. He felt that this was really not of much concern and that  further imaging via endoscopic ultrasound would be appropriate in about one month's time. The appearance is was not typical for primary adenocarcinoma. Possibly, he could have lymphoma or neuroendocrine tumor, this is why endoscopic ultrasound would be of value. In any case, he is tolerated a liquid diet and his pain is much better controlled today. He is now stable for discharge to manage this as an outpatient. He'll be sent home with oral analgesia and Creon.  Procedures:  None.   Consultations:  Gastroenterology, Dr. Kendell Bane.  Discharge Exam: Filed Vitals:   02/16/13 1837 02/16/13 2229 02/17/13 0201 02/17/13 0538  BP: 170/90 161/98 144/89 155/91  Pulse: 47 49 69 56  Temp: 98.1 F (36.7 C)  98.2 F (36.8 C) 98.3 F (36.8 C)  TempSrc:  Oral Oral Oral  Resp: 18 16 18 18   Height:      Weight:    73.8 kg (162 lb 11.2 oz)  SpO2: 100% 98% 100% 100%    General: Looks systemically well. Does not appear to be in pain. Cardiovascular: Heart sounds are present and normal without murmurs or added sounds. Respiratory: Lung fields are clear. Abdomen is soft and largely nontender  Discharge Instructions  Discharge Orders   Future Orders Complete By Expires     Diet - low sodium heart healthy  As directed     Increase activity slowly  As directed         Medication List    TAKE these medications       lipase/protease/amylase 65784 UNITS Cpep  Commonly known as:  CREON-12/PANCREASE  Take 3 capsules by mouth 3 (three) times daily with meals.     metFORMIN 500 MG tablet  Commonly known as:  GLUCOPHAGE  Take 500 mg by mouth 2 (two) times daily with a meal.     oxyCODONE 5 MG immediate release tablet  Commonly known as:  Oxy IR/ROXICODONE  Take 1 tablet (5 mg total) by mouth every 4 (four) hours as needed.     thiamine 100 MG tablet  Take 1 tablet (100 mg total) by mouth daily.       No Known Allergies    The results of significant diagnostics from this hospitalization  (including imaging, microbiology, ancillary and laboratory) are listed below for reference.    Significant Diagnostic Studies: Ct Abdomen Pelvis W Contrast  02/15/2013   *RADIOLOGY REPORT*  Clinical Data: Abdominal pain, history of pancreatitis  CT ABDOMEN AND PELVIS WITH CONTRAST  Technique:  Multidetector CT imaging of the abdomen and pelvis was performed following the standard protocol during bolus administration of intravenous contrast.  Contrast: 50mL OMNIPAQUE IOHEXOL 300 MG/ML  SOLN, OMNIPAQUE IOHEXOL 300 MG/ML  SOLN  Comparison: 05/25/2011  Findings: Lung bases are clear.  Gallbladder distention noted without other CT evidence for acute cholecystitis.  No radiopaque renal calculus.  1 cm porta hepatis lymph node image 26 is unchanged when allowing for slice selection differences and previous technique.  Adrenal glands, spleen, liver, and kidneys are normal.  Pancreatic calcifications are re- identified without peripancreatic ductal dilatation or surrounding stranding.  Immediately abutting the pancreatic head, there is a 3.8 x 3.3 by 3.6 cm mass measuring 67 HU.  There is no evidence for adjacent large vessel encasement or invasion.  No bowel wall thickening or focal segmental dilatation is identified.  Bladder is normal.  The appendix is normal, image 38. No ascites, retroperitoneal, or pelvic lymphadenopathy.  No acute osseous abnormality.  IMPRESSION: Mass lesion abutting the head of the pancreas with evidence of chronic pancreatitis but no peripancreatic fluid collection or stranding or pancreatic ductal dilatation.  Differential considerations include right lung related to pancreatitis given the clinical history, or a solid mass.  If the patient has evidence for active pancreatitis, further imaging would be recommended on a non emergent basis as an outpatient, after resolution of the patient's acute symptoms, specifically abdominal MRI with contrast according to pancreatic mass protocol.    Original Report Authenticated By: Christiana Pellant, M.D.   Dg Abd Acute W/chest  02/15/2013   *RADIOLOGY REPORT*  Clinical Data: Abdominal pain.  Nausea.  ACUTE ABDOMEN SERIES (ABDOMEN 2 VIEW & CHEST 1 VIEW)  Comparison: Chest radiographs 05/26/2011 and 05/25/2011.  Abdominal CT 05/25/2011.  Findings: The heart size and mediastinal contours are normal. The lungs are clear. There is no pleural effusion or pneumothorax. No acute osseous findings are identified.  Supine view abdomen demonstrates no significant bowel distension. However, on the erect view, there are several air-fluid levels within the right mid abdomen which are probably within small bowel. Calcifications of chronic pancreatitis are noted.  There is no free intraperitoneal air or other suspicious calcification.  Spurring of the left iliac crest is unchanged.  IMPRESSION: Nonspecific bowel gas pattern with scattered air fluid levels within nondistended bowel.  This may represent enteritis.  If the patient has obstructive symptoms, follow-up or CT should be considered.   Original Report Authenticated By: Carey Bullocks, M.D.        Labs: Basic Metabolic Panel:  Recent Labs Lab 02/15/13 1747 02/16/13 0421  02/17/13 0547  NA 136 138 135  K 3.2* 3.9 4.2  CL 97 102 98  CO2 24 25 27   GLUCOSE 186* 119* 118*  BUN 6 4* 4*  CREATININE 0.86 0.80 0.79  CALCIUM 9.1 8.6 9.4  MG  --  2.3  --    Liver Function Tests:  Recent Labs Lab 02/15/13 1747 02/16/13 0421 02/17/13 0547  AST 136* 53* 25  ALT 92* 67* 46  ALKPHOS 138* 126* 123*  BILITOT 0.5 0.7 1.5*  PROT 7.6 6.8 6.8  ALBUMIN 4.2 3.8 3.7    Recent Labs Lab 02/15/13 1747 02/17/13 0547  LIPASE 16 10*    CBC:  Recent Labs Lab 02/15/13 1747 02/16/13 0421 02/17/13 0547  WBC 8.4 10.8* 9.2  NEUTROABS 6.7  --   --   HGB 14.5 13.3 13.1  HCT 41.4 38.1* 36.4*  MCV 102.7* 103.0* 101.4*  PLT 230 220 183      CBG:  Recent Labs Lab 02/16/13 1718 02/16/13 1949  02/17/13 0013 02/17/13 0428 02/17/13 0723  GLUCAP 102* 111* 101* 120* 114*       Signed:  GOSRANI,NIMISH C  [facility] 02/17/2013, 10:33 AM

## 2013-02-18 ENCOUNTER — Encounter: Payer: Self-pay | Admitting: Gastroenterology

## 2013-02-18 ENCOUNTER — Other Ambulatory Visit: Payer: Self-pay | Admitting: Gastroenterology

## 2013-02-18 DIAGNOSIS — K8689 Other specified diseases of pancreas: Secondary | ICD-10-CM

## 2013-02-18 NOTE — Progress Notes (Unsigned)
Mr. Boehlke was recently inpatient due to epigastric pain, chronic ETOH pancreatitis. Abnormal CT revealed mass abutting the head of the pancreas. Needs EUS with Dr. Dulce Sellar per Dr. Darrick Penna in about 4-6 weeks. Please arrange. Thanks!

## 2013-02-18 NOTE — Progress Notes (Signed)
Referral has been sent to Dr. Dulce Sellar

## 2013-02-26 ENCOUNTER — Encounter (HOSPITAL_COMMUNITY): Payer: Self-pay | Admitting: Pharmacy Technician

## 2013-02-26 ENCOUNTER — Encounter (HOSPITAL_COMMUNITY): Payer: Self-pay | Admitting: *Deleted

## 2013-03-04 ENCOUNTER — Encounter (HOSPITAL_COMMUNITY): Payer: Self-pay | Admitting: *Deleted

## 2013-03-04 ENCOUNTER — Encounter (HOSPITAL_COMMUNITY): Payer: Self-pay | Admitting: Anesthesiology

## 2013-03-04 ENCOUNTER — Ambulatory Visit (HOSPITAL_COMMUNITY): Payer: Medicare Other | Admitting: Anesthesiology

## 2013-03-04 ENCOUNTER — Encounter (HOSPITAL_COMMUNITY)
Admission: RE | Disposition: A | Discharge status: Home / Self Care | Payer: Self-pay | Source: Ambulatory Visit | Attending: Gastroenterology

## 2013-03-04 ENCOUNTER — Ambulatory Visit (HOSPITAL_COMMUNITY)
Admission: RE | Admit: 2013-03-04 | Discharge: 2013-03-04 | Disposition: A | Discharge status: Home / Self Care | Payer: Medicare Other | Source: Ambulatory Visit | Attending: Gastroenterology | Admitting: Gastroenterology

## 2013-03-04 DIAGNOSIS — K861 Other chronic pancreatitis: Secondary | ICD-10-CM | POA: Insufficient documentation

## 2013-03-04 DIAGNOSIS — K219 Gastro-esophageal reflux disease without esophagitis: Secondary | ICD-10-CM | POA: Insufficient documentation

## 2013-03-04 DIAGNOSIS — E119 Type 2 diabetes mellitus without complications: Secondary | ICD-10-CM | POA: Insufficient documentation

## 2013-03-04 DIAGNOSIS — I10 Essential (primary) hypertension: Secondary | ICD-10-CM | POA: Insufficient documentation

## 2013-03-04 DIAGNOSIS — K869 Disease of pancreas, unspecified: Secondary | ICD-10-CM | POA: Insufficient documentation

## 2013-03-04 HISTORY — PX: ESOPHAGOGASTRODUODENOSCOPY (EGD) WITH PROPOFOL: SHX5813

## 2013-03-04 HISTORY — DX: Gastro-esophageal reflux disease without esophagitis: K21.9

## 2013-03-04 HISTORY — PX: EUS: SHX5427

## 2013-03-04 LAB — CANCER ANTIGEN 19-9: CA 19-9: <1.2 U/mL — ABNORMAL LOW (ref <35.0)

## 2013-03-04 SURGERY — ESOPHAGEAL ENDOSCOPIC ULTRASOUND (EUS) RADIAL
Anesthesia: Monitor Anesthesia Care

## 2013-03-04 MED ORDER — KETAMINE HCL 10 MG/ML IJ SOLN
INTRAMUSCULAR | Status: DC | PRN
  Administered 2013-03-04: 20 mg via INTRAVENOUS

## 2013-03-04 MED ORDER — CIPROFLOXACIN IN D5W 400 MG/200ML IV SOLN
400.0000 mg | Freq: Once | INTRAVENOUS | Status: AC
  Administered 2013-03-04: 400 mg via INTRAVENOUS

## 2013-03-04 MED ORDER — MIDAZOLAM HCL 5 MG/5ML IJ SOLN
INTRAMUSCULAR | Status: DC | PRN
  Administered 2013-03-04 (×2): 1 mg via INTRAVENOUS

## 2013-03-04 MED ORDER — FENTANYL CITRATE 0.05 MG/ML IJ SOLN: 25.0000 ug | INTRAMUSCULAR | Status: DC | PRN

## 2013-03-04 MED ORDER — LIDOCAINE HCL (CARDIAC) 20 MG/ML IV SOLN
INTRAVENOUS | Status: DC | PRN
  Administered 2013-03-04: 100 mg via INTRAVENOUS

## 2013-03-04 MED ORDER — PROPOFOL INFUSION 10 MG/ML OPTIME
INTRAVENOUS | Status: DC | PRN
  Administered 2013-03-04: 140 ug/kg/min via INTRAVENOUS

## 2013-03-04 MED ORDER — FENTANYL CITRATE 0.05 MG/ML IJ SOLN
INTRAMUSCULAR | Status: DC | PRN
  Administered 2013-03-04 (×2): 50 ug via INTRAVENOUS

## 2013-03-04 MED ORDER — LACTATED RINGERS IV SOLN
INTRAVENOUS | Status: DC
  Administered 2013-03-04: 1000 mL via INTRAVENOUS

## 2013-03-04 MED ORDER — GLYCOPYRROLATE 0.2 MG/ML IJ SOLN
INTRAMUSCULAR | Status: DC | PRN
  Administered 2013-03-04: .2 mg via INTRAVENOUS

## 2013-03-04 MED ORDER — BUTAMBEN-TETRACAINE-BENZOCAINE 2-2-14 % EX AERO
INHALATION_SPRAY | CUTANEOUS | Status: DC | PRN
  Administered 2013-03-04: 2 via TOPICAL

## 2013-03-04 MED ORDER — SODIUM CHLORIDE 0.9 % IV SOLN: INTRAVENOUS | Status: DC

## 2013-03-04 MED ORDER — CIPROFLOXACIN IN D5W 400 MG/200ML IV SOLN
INTRAVENOUS | Status: AC
  Filled 2013-03-04: qty 200

## 2013-03-04 NOTE — Op Note (Signed)
[facility] [address] [place], [address]   ENDOSCOPIC ULTRASOUND PROCEDURE REPORT  PATIENT: [name], [name]  MR#: [id number] BIRTHDATE: March 25, 1971  GENDER: Male ENDOSCOPIST: Willis Modena, MD REFERRED BY:  Jonette Eva, MD PROCEDURE DATE:  03/04/2013 PROCEDURE:   Upper EUS ASA CLASS:      Class III INDICATIONS:   1.  chronic pancreatitis, cystic lesion on CT. MEDICATIONS: MAC sedation, administered by CRNA, Cipro 400 mg IV, and Cetacaine spray x 2  DESCRIPTION OF PROCEDURE:   After the risks benefits and alternatives of the procedure were  explained, informed consent was obtained. The patient was then placed in the left, lateral, decubitus postion and IV sedation was administered. Throughout the procedure, the patients blood pressure, pulse and oxygen saturations were monitored continuously.  Under direct visualization, the     endoscope was introduced through the mouth and advanced to the second portion of the duodenum .  Water was used as necessary to provide an acoustic interface.  Upon completion of the imaging, water was removed and the patient was sent to the recovery room in satisfactory condition.    FINDINGS:      Extensive parenchymal calcifications throughout the pancreas.  In the region of the uncinate process, a 46mm x 34mm round lesion was seen.  It is hypoechoic with multiple anechoic spaces and suggestion of intramural calcifications.  The lesion is closely nestled, and seems to directly abut (but not invade) the portal vein and splenic vein in the region of the portal confluence.  I could not obtain any views of the lesion for biopsy that would not involve either direct puncture through the splenic vein or biopsy through over 1cm of pancreas prior to reaching the lesion.  Based on this, as well as the chronicity of the lesion (seen on CT, albeit smaller, in September 2012), I felt risks of biopsy outweigh the benefits.  IMPRESSION:      As above.  Mixed solid and cystic lesion of uncinate pancreas.  Differential considerations include complicated pseudocyst, cystic lesion (e.g., mucinous cystadenoma), neuroendocrine lesion.  Pancreatic adenocarcinoma is not favored, but can't be definitively excluded.  RECOMMENDATIONS:     1.  Watch for potential complications of procedure. 2.  Check CEA and Ca 19-9. 3.  MRI abdomen with and without contrast, pancreatic protocol, if patient is eligible (vague history of metal in left elbow), for further characterization. 4.  If not candidate for MRI, and if tumor markers are unrevealing, then consider repeat CT abdomen with constrast, pancreatic protocol, in 4-6 months for surveillance of size of lesion. 5.  After above studies, if patient felt to be at high risk for malignancy, would consider surgical referral and could repeat EUS with FNA biopsies if felt necessary by the surgical consultant, with understanding that biopsies would be of relative high risk.    _______________________________ eSigned:  Willis Modena, MD 03/04/2013 10:39 AMn revisedCC:

## 2013-03-04 NOTE — Transfer of Care (Signed)
Immediate Anesthesia Transfer of Care Note  Patient: [name]  Procedure(s) Performed: Procedure(s): ESOPHAGEAL ENDOSCOPIC ULTRASOUND (EUS) RADIAL (N/A) FINE NEEDLE ASPIRATION (FNA) LINEAR (N/A)  Patient Location: PACU  Anesthesia Type:MAC  Level of Consciousness: awake, alert  and oriented  Airway & Oxygen Therapy: Patient Spontanous Breathing and Patient connected to nasal cannula oxygen  Post-op Assessment: Report given to PACU RN and Post -op Vital signs reviewed and stable  Post vital signs: Reviewed and stable  Complications: No apparent anesthesia complications

## 2013-03-04 NOTE — Anesthesia Postprocedure Evaluation (Signed)
  Anesthesia Post-op Note  Patient: [name]  Procedure(s) Performed: Procedure(s) (LRB): ESOPHAGEAL ENDOSCOPIC ULTRASOUND (EUS) RADIAL (N/A)  Patient Location: PACU  Anesthesia Type: MAC  Level of Consciousness: awake and alert   Airway and Oxygen Therapy: Patient Spontanous Breathing  Post-op Pain: mild  Post-op Assessment: Post-op Vital signs reviewed, Patient's Cardiovascular Status Stable, Respiratory Function Stable, Patent Airway and No signs of Nausea or vomiting  Last Vitals:  Filed Vitals:   03/04/13 1100  BP: 117/84  Pulse:   Temp:   Resp: 11    Post-op Vital Signs: stable   Complications: No apparent anesthesia complications

## 2013-03-04 NOTE — Anesthesia Preprocedure Evaluation (Addendum)
Anesthesia Evaluation  Patient identified by MRN, date of birth, ID band Patient awake    Reviewed: Allergy & Precautions, H&P , NPO status , Patient's Chart, lab work & pertinent test results  Airway Mallampati: II TM Distance: >3 FB Neck ROM: full    Dental no notable dental hx. (+) Teeth Intact and Dental Advisory Given   Pulmonary neg pulmonary ROS,  breath sounds clear to auscultation  Pulmonary exam normal       Cardiovascular Exercise Tolerance: Good hypertension, negative cardio ROS  Rhythm:regular Rate:Normal     Neuro/Psych negative neurological ROS  negative psych ROS   GI/Hepatic negative GI ROS, Neg liver ROS, GERD-  ,(+)     substance abuse  alcohol use, Pancreatitis panreatitis   Endo/Other  diabetes, Well Controlled, Type 2, Oral Hypoglycemic Agents  Renal/GU negative Renal ROS  negative genitourinary   Musculoskeletal   Abdominal   Peds  Hematology negative hematology ROS (+)   Anesthesia Other Findings   Reproductive/Obstetrics negative OB ROS                          Anesthesia Physical Anesthesia Plan  ASA: III  Anesthesia Plan: MAC   Post-op Pain Management:    Induction:   Airway Management Planned: Simple Face Mask  Additional Equipment:   Intra-op Plan:   Post-operative Plan:   Informed Consent: I have reviewed the patients History and Physical, chart, labs and discussed the procedure including the risks, benefits and alternatives for the proposed anesthesia with the patient or authorized representative who has indicated his/her understanding and acceptance.   Dental Advisory Given  Plan Discussed with: CRNA and Surgeon  Anesthesia Plan Comments:         Anesthesia Quick Evaluation

## 2013-03-04 NOTE — Preoperative (Signed)
Beta Blockers   Reason not to administer Beta Blockers:Not Applicable 

## 2013-03-04 NOTE — H&P (Signed)
Patient interval history reviewed.  Patient examined again.  There has been no change from documented H/P dated 02/23/13 (scanned into chart from our office) except as documented above.  Assessment:  1.  Pancreatic lesion on CT.  Suspect cystic lesion (likely pseudocyst), as similar (albeit smaller in size) lesion seen on CT in September 2012. 2.  Chronic pancreatitis, likely alcohol etiology.  Plan:  1.  Endoscopic Ultrasound (EUS) with possible biopsy (Fine needle aspiration). 2.  Risks (bleeding, infection, bowel perforation that could require surgery, sedation-related changes in cardiopulmonary systems), benefits (identification and possible treatment of source of symptoms, exclusion of certain causes of symptoms), and alternatives (watchful waiting, radiographic imaging studies, empiric medical treatment) of upper endoscopy with ultrasound and possible biopsies (EUS +/- FNA) were explained to patient in detail and patient wishes to proceed.

## 2013-03-05 ENCOUNTER — Encounter (HOSPITAL_COMMUNITY): Payer: Self-pay | Admitting: Gastroenterology

## 2013-03-09 ENCOUNTER — Other Ambulatory Visit: Payer: Self-pay | Admitting: Gastroenterology

## 2013-03-09 DIAGNOSIS — K8689 Other specified diseases of pancreas: Secondary | ICD-10-CM

## 2013-03-11 ENCOUNTER — Other Ambulatory Visit: Payer: Medicare Other

## 2013-03-17 ENCOUNTER — Ambulatory Visit
Admission: RE | Admit: 2013-03-17 | Discharge: 2013-03-17 | Disposition: A | Discharge status: Home / Self Care | Payer: Medicare Other | Source: Ambulatory Visit | Attending: Gastroenterology | Admitting: Gastroenterology

## 2013-03-17 ENCOUNTER — Ambulatory Visit: Payer: Medicare Other | Admitting: Gastroenterology

## 2013-03-17 DIAGNOSIS — K8689 Other specified diseases of pancreas: Secondary | ICD-10-CM

## 2013-03-17 MED ORDER — GADOBENATE DIMEGLUMINE 529 MG/ML IV SOLN
14.0000 mL | Freq: Once | INTRAVENOUS | Status: AC | PRN
  Administered 2013-03-17: 14 mL via INTRAVENOUS

## 2013-03-31 ENCOUNTER — Other Ambulatory Visit (INDEPENDENT_AMBULATORY_CARE_PROVIDER_SITE_OTHER): Payer: Self-pay | Admitting: General Surgery

## 2013-03-31 ENCOUNTER — Encounter (INDEPENDENT_AMBULATORY_CARE_PROVIDER_SITE_OTHER): Payer: Self-pay | Admitting: General Surgery

## 2013-03-31 ENCOUNTER — Ambulatory Visit (INDEPENDENT_AMBULATORY_CARE_PROVIDER_SITE_OTHER): Payer: Medicare Other | Admitting: General Surgery

## 2013-03-31 VITALS — BP 118/70 | HR 60 | Temp 97.3°F | Resp 14 | Ht 73.0 in | Wt 164.0 lb

## 2013-03-31 DIAGNOSIS — K8689 Other specified diseases of pancreas: Secondary | ICD-10-CM

## 2013-03-31 DIAGNOSIS — K8681 Exocrine pancreatic insufficiency: Secondary | ICD-10-CM

## 2013-03-31 DIAGNOSIS — F1010 Alcohol abuse, uncomplicated: Secondary | ICD-10-CM

## 2013-03-31 MED ORDER — PANCRELIPASE (LIP-PROT-AMYL) 12000-38000 UNITS PO CPEP: 3.0000 | ORAL_CAPSULE | Freq: Three times a day (TID) | ORAL | Status: DC
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
## 2013-03-31 NOTE — Patient Instructions (Signed)
Make all attempts to stop drinking alcohol.    Continue creon.  Will get follow up MRI in 2-3 months and see me afterward.  It looks like you may have hemorrhagic pseudocyst (not a cancer).

## 2013-03-31 NOTE — Assessment & Plan Note (Signed)
The patient's Creon is refilled. He is encouraged to add at least one capsule with snacks.  He is due to followup with his primary care physician regarding this problem.

## 2013-03-31 NOTE — Assessment & Plan Note (Signed)
MRI did demonstrate decrease in size of this mass from the prior CT, and decrease in concerning characteristics. The MRI also demonstrates the mass is smaller than the endoscopic ultrasound.  I will plan to repeat his imaging in approximately 2-3 months and see him back following the scan.  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  Hopefully on repeat imaging, the masslike region will have resolved.  I would definitely hold on a Whipple for resection of this mass given the significant improvement seen on the recent imaging.

## 2013-03-31 NOTE — Assessment & Plan Note (Signed)
I counseled the patient regarding cessation of alcohol intake given his difficulty with chronic pancreatitis. I advised him that if he stops cold [place] he may exhibit signs of delirium tremens and be a risk for seizures. I advised him to cut back gradually to where he is not taking anything. He did not seem willing to stop alcohol intake at this time.

## 2013-05-27 ENCOUNTER — Other Ambulatory Visit (INDEPENDENT_AMBULATORY_CARE_PROVIDER_SITE_OTHER): Payer: Self-pay | Admitting: General Surgery

## 2013-05-27 ENCOUNTER — Other Ambulatory Visit (INDEPENDENT_AMBULATORY_CARE_PROVIDER_SITE_OTHER): Payer: Self-pay

## 2013-05-27 DIAGNOSIS — K8689 Other specified diseases of pancreas: Secondary | ICD-10-CM

## 2013-06-22 ENCOUNTER — Telehealth (INDEPENDENT_AMBULATORY_CARE_PROVIDER_SITE_OTHER): Payer: Self-pay | Admitting: *Deleted

## 2013-06-22 ENCOUNTER — Other Ambulatory Visit (INDEPENDENT_AMBULATORY_CARE_PROVIDER_SITE_OTHER): Payer: Self-pay | Admitting: *Deleted

## 2013-06-22 DIAGNOSIS — K869 Disease of pancreas, unspecified: Secondary | ICD-10-CM

## 2013-06-22 NOTE — Telephone Encounter (Signed)
MRI called to state that they are requiring Creatinine and BUN on this patient prior to performing MRI.  Orders placed at this time.

## 2013-06-23 ENCOUNTER — Ambulatory Visit (HOSPITAL_COMMUNITY)
Admission: RE | Admit: 2013-06-23 | Discharge: 2013-06-23 | Disposition: A | Discharge status: Home / Self Care | Payer: Medicare Other | Source: Ambulatory Visit | Attending: General Surgery | Admitting: General Surgery

## 2013-06-23 DIAGNOSIS — K8689 Other specified diseases of pancreas: Secondary | ICD-10-CM | POA: Insufficient documentation

## 2013-06-23 LAB — CREATININE, SERUM: Creatinine, Ser: 0.93 mg/dL (ref 0.50–1.35)

## 2013-06-23 MED ORDER — GADOBENATE DIMEGLUMINE 529 MG/ML IV SOLN
15.0000 mL | Freq: Once | INTRAVENOUS | Status: AC | PRN
  Administered 2013-06-23: 15 mL via INTRAVENOUS

## 2013-06-24 NOTE — Progress Notes (Signed)
Quick Note:  Please let patient know that mass has gotten larger, but appears to be benign. I should have appt to discuss. ______

## 2013-06-26 ENCOUNTER — Telehealth (INDEPENDENT_AMBULATORY_CARE_PROVIDER_SITE_OTHER): Payer: Self-pay

## 2013-06-26 NOTE — Telephone Encounter (Signed)
Called pt and gave him the below msg.

## 2013-06-26 NOTE — Telephone Encounter (Signed)
Message copied by Brennan Bailey on Fri Jun 26, 2013  4:31 PM ------      Message from: Almond Lint      Created: Wed Jun 24, 2013  8:54 AM       Please let patient know that mass has gotten larger, but appears to be benign.  I should have appt to discuss. ------

## 2013-07-01 ENCOUNTER — Encounter (INDEPENDENT_AMBULATORY_CARE_PROVIDER_SITE_OTHER): Payer: Self-pay | Admitting: General Surgery

## 2013-07-01 ENCOUNTER — Ambulatory Visit (INDEPENDENT_AMBULATORY_CARE_PROVIDER_SITE_OTHER): Payer: Medicare Other | Admitting: General Surgery

## 2013-07-01 VITALS — BP 104/70 | HR 72 | Resp 16 | Ht 73.5 in | Wt 168.0 lb

## 2013-07-01 DIAGNOSIS — K8689 Other specified diseases of pancreas: Secondary | ICD-10-CM

## 2013-07-01 DIAGNOSIS — K8681 Exocrine pancreatic insufficiency: Secondary | ICD-10-CM

## 2013-07-01 DIAGNOSIS — F1010 Alcohol abuse, uncomplicated: Secondary | ICD-10-CM

## 2013-07-01 DIAGNOSIS — K869 Disease of pancreas, unspecified: Secondary | ICD-10-CM

## 2013-07-01 MED ORDER — PANCRELIPASE (LIP-PROT-AMYL) 12000-38000 UNITS PO CPEP: 3.0000 | ORAL_CAPSULE | Freq: Three times a day (TID) | ORAL | Status: DC

## 2013-07-01 NOTE — Patient Instructions (Signed)
For the samples, take 1-2 with meals instead of 3.    Take 1 with snacks.  Try to quit drinking completely.

## 2013-07-01 NOTE — Assessment & Plan Note (Signed)
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 It appeared to be getting smaller until the most recent scan where it was enlarging. I would not favor doing any kind of intervention right now where he is completely asymptomatic and the pseudocyst is still relatively small.  I will ask Dr. Dulce Sellar if there is any value in attempting pancreatic stenting. We will repeat the scan in 6 months.

## 2013-07-01 NOTE — Assessment & Plan Note (Signed)
Advised patient to continue going down on his alcohol intake to where he is not taking any. I think that the continued alcohol intake is contributing to his chronic pancreatitis.

## 2013-07-01 NOTE — Progress Notes (Signed)
HISTORY: Patient is a 42 year old male who presented earlier with a pancreatic mass. This could not be easily biopsied due to its location near the splenic vein. We had reviewed his imaging and it did appear to be getting smaller originally.  He remains asymptomatic. He denies any weight loss. He is not having any nausea, vomiting, early satiety, decreased appetite, or diarrhea. He remains on his Creon. He takes 3 with meals. He continues to drink around 2 servings of alcohol per day. This is a significant cutback for him.   PERTINENT REVIEW OF SYSTEMS: Otherwise negative.    Filed Vitals:   07/01/13 1051  BP: 104/70  Pulse: 72  Resp: 16   Filed Weights   07/01/13 1051  Weight: 168 lb (76.204 kg)     EXAM: Head: Normocephalic and atraumatic.  Eyes:  Conjunctivae are normal. Pupils are equal, round, and reactive to light. No scleral icterus.  Neck:  Normal range of motion. Neck supple. No tracheal deviation present. No thyromegaly present.  Resp: No respiratory distress, normal effort. Abd:  Abdomen is soft, non distended and non tender. No masses are palpable.  There is no rebound and no guarding.  Neurological: Alert and oriented to person, place, and time. Coordination normal.  Skin: Skin is warm and dry. No rash noted. No diaphoretic. No erythema. No pallor.  Psychiatric: Normal mood and affect. Normal behavior. Judgment and thought content normal.     MR abd IMPRESSION:  1. Interval enlargement of the lesion in the head and uncinate  process of the pancreas, which has imaging characteristics  compatible with a complex pseudocyst containing internal hemorrhage  and proteinaceous debris.    ASSESSMENT AND PLAN:   ALCOHOL ABUSE Advised patient to continue going down on his alcohol intake to where he is not taking any. I think that the continued alcohol intake is contributing to his chronic pancreatitis.  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 It  appeared to be getting smaller until the most recent scan where it was enlarging. I would not favor doing any kind of intervention right now where he is completely asymptomatic and the pseudocyst is still relatively small.  I will ask Dr. Dulce Sellar if there is any value in attempting pancreatic stenting. We will repeat the scan in 6 months.       Maudry Diego, MD Surgical Oncology, General & Endocrine Surgery [facility]  Provider Not In System No ref. provider found

## 2013-12-03 ENCOUNTER — Emergency Department (HOSPITAL_COMMUNITY): Payer: Medicare Other

## 2013-12-03 ENCOUNTER — Emergency Department (HOSPITAL_COMMUNITY)
Admission: EM | Admit: 2013-12-03 | Discharge: 2013-12-03 | Disposition: A | Discharge status: Home / Self Care | Payer: Medicare Other | Attending: Emergency Medicine | Admitting: Emergency Medicine

## 2013-12-03 ENCOUNTER — Encounter (HOSPITAL_COMMUNITY): Payer: Self-pay | Admitting: Emergency Medicine

## 2013-12-03 DIAGNOSIS — R51 Headache: Secondary | ICD-10-CM | POA: Insufficient documentation

## 2013-12-03 DIAGNOSIS — Z79899 Other long term (current) drug therapy: Secondary | ICD-10-CM | POA: Insufficient documentation

## 2013-12-03 DIAGNOSIS — Z8719 Personal history of other diseases of the digestive system: Secondary | ICD-10-CM | POA: Insufficient documentation

## 2013-12-03 DIAGNOSIS — L0201 Cutaneous abscess of face: Secondary | ICD-10-CM | POA: Insufficient documentation

## 2013-12-03 DIAGNOSIS — M542 Cervicalgia: Secondary | ICD-10-CM | POA: Insufficient documentation

## 2013-12-03 DIAGNOSIS — F17200 Nicotine dependence, unspecified, uncomplicated: Secondary | ICD-10-CM | POA: Insufficient documentation

## 2013-12-03 DIAGNOSIS — E119 Type 2 diabetes mellitus without complications: Secondary | ICD-10-CM | POA: Insufficient documentation

## 2013-12-03 DIAGNOSIS — Z792 Long term (current) use of antibiotics: Secondary | ICD-10-CM | POA: Insufficient documentation

## 2013-12-03 DIAGNOSIS — I10 Essential (primary) hypertension: Secondary | ICD-10-CM | POA: Insufficient documentation

## 2013-12-03 DIAGNOSIS — L03211 Cellulitis of face: Principal | ICD-10-CM | POA: Insufficient documentation

## 2013-12-03 LAB — BASIC METABOLIC PANEL
BUN: 5 mg/dL — ABNORMAL LOW (ref 6–23)
CO2: 24 mEq/L (ref 19–32)
CREATININE: 0.66 mg/dL (ref 0.50–1.35)
Calcium: 8.7 mg/dL (ref 8.4–10.5)
Chloride: 103 mEq/L (ref 96–112)
GFR calc non Af Amer: >90 mL/min (ref >90)
Glucose, Bld: 133 mg/dL — ABNORMAL HIGH (ref 70–99)
Potassium: 3.7 mEq/L (ref 3.7–5.3)
SODIUM: 141 meq/L (ref 137–147)

## 2013-12-03 LAB — CBC WITH DIFFERENTIAL/PLATELET
BASOS ABS: 0 10*3/uL (ref 0.0–0.1)
BASOS PCT: 0 % (ref 0–1)
Eosinophils Absolute: 0 10*3/uL (ref 0.0–0.7)
Eosinophils Relative: 0 % (ref 0–5)
HCT: 44.3 % (ref 39.0–52.0)
Hemoglobin: 15.6 g/dL (ref 13.0–17.0)
Lymphocytes Relative: 16 % (ref 12–46)
Lymphs Abs: 1.9 10*3/uL (ref 0.7–4.0)
MCH: 36.9 pg — ABNORMAL HIGH (ref 26.0–34.0)
MCHC: 35.2 g/dL (ref 30.0–36.0)
MCV: 104.7 fL — ABNORMAL HIGH (ref 78.0–100.0)
Monocytes Absolute: 0.9 10*3/uL (ref 0.1–1.0)
Monocytes Relative: 8 % (ref 3–12)
NEUTROS PCT: 76 % (ref 43–77)
Neutro Abs: 9.1 10*3/uL — ABNORMAL HIGH (ref 1.7–7.7)
PLATELETS: 191 10*3/uL (ref 150–400)
RBC: 4.23 MIL/uL (ref 4.22–5.81)
RDW: 12.8 % (ref 11.5–15.5)
WBC: 11.9 10*3/uL — ABNORMAL HIGH (ref 4.0–10.5)

## 2013-12-03 LAB — CBG MONITORING, ED: GLUCOSE-CAPILLARY: 280 mg/dL — AB (ref 70–99)

## 2013-12-03 MED ORDER — HYDROMORPHONE HCL PF 1 MG/ML IJ SOLN
1.0000 mg | Freq: Once | INTRAMUSCULAR | Status: AC
  Administered 2013-12-03: 1 mg via INTRAVENOUS
  Filled 2013-12-03: qty 1

## 2013-12-03 MED ORDER — ONDANSETRON HCL 4 MG/2ML IJ SOLN
4.0000 mg | Freq: Once | INTRAMUSCULAR | Status: AC
  Administered 2013-12-03: 4 mg via INTRAVENOUS
  Filled 2013-12-03: qty 2

## 2013-12-03 MED ORDER — SODIUM CHLORIDE 0.9 % IV SOLN
INTRAVENOUS | Status: DC
  Administered 2013-12-03: 14:00:00 via INTRAVENOUS

## 2013-12-03 MED ORDER — CLINDAMYCIN HCL 150 MG PO CAPS: 150.0000 mg | ORAL_CAPSULE | Freq: Four times a day (QID) | ORAL | Status: DC

## 2013-12-03 MED ORDER — CLINDAMYCIN PHOSPHATE 300 MG/50ML IV SOLN
300.0000 mg | Freq: Once | INTRAVENOUS | Status: AC
  Administered 2013-12-03: 300 mg via INTRAVENOUS
  Filled 2013-12-03 (×2): qty 50

## 2013-12-03 MED ORDER — HYDROCODONE-ACETAMINOPHEN 5-325 MG PO TABS: 1.0000 | ORAL_TABLET | Freq: Four times a day (QID) | ORAL | Status: DC | PRN

## 2013-12-03 MED ORDER — IOHEXOL 300 MG/ML  SOLN: 80.0000 mL | Freq: Once | INTRAMUSCULAR | Status: DC | PRN

## 2013-12-03 MED ORDER — IOHEXOL 300 MG/ML  SOLN
75.0000 mL | Freq: Once | INTRAMUSCULAR | Status: AC | PRN
  Administered 2013-12-03: 75 mL via INTRAVENOUS

## 2013-12-03 NOTE — ED Notes (Signed)
Swelling of rt submandibular area for 3-4 days.

## 2013-12-03 NOTE — ED Notes (Addendum)
Blood hemolyzed, waiting for Creatinine to result prior to CT Scan. Pt made aware of delay.

## 2013-12-03 NOTE — ED Notes (Signed)
Patient transported to CT 

## 2013-12-03 NOTE — Discharge Instructions (Signed)
Abscess An abscess (boil or furuncle) is an infected area on or under the skin. This area is filled with yellowish-white fluid (pus) and other material (debris). HOME CARE   Only take medicines as told by your doctor.  If you were given antibiotic medicine, take it as directed. Finish the medicine even if you start to feel better.  If gauze is used, follow your doctor's directions for changing the gauze.  To avoid spreading the infection:  Keep your abscess covered with a bandage.  Wash your hands well.  Do not share personal care items, towels, or whirlpools with others.  Avoid skin contact with others.  Keep your skin and clothes clean around the abscess.  Keep all doctor visits as told. GET HELP RIGHT AWAY IF:   You have more pain, puffiness (swelling), or redness in the wound site.  You have more fluid or blood coming from the wound site.  You have muscle aches, chills, or you feel sick.  You have a fever. MAKE SURE YOU:   Understand these instructions.  Will watch your condition.  Will get help right away if you are not doing well or get worse. Document Released: 02/20/2008 Document Revised: 03/04/2012 Document Reviewed: 11/16/2011 [facility] Patient Information 2014 [employer], [place].  Take pain medication as directed. Take antibiotic as directed. Call ENT office tomorrow for the time that Dr. Pollyann Kennedyosen wants to see you. Phone number and address provided. Return for any new or worse symptoms.
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
## 2014-10-06 LAB — HEMOGLOBIN A1C: Hgb A1c MFr Bld: 6.6 % — AB (ref 4.0–6.0)

## 2014-11-20 ENCOUNTER — Emergency Department (HOSPITAL_COMMUNITY)
Admission: EM | Admit: 2014-11-20 | Discharge: 2014-11-20 | Disposition: A | Discharge status: Home / Self Care | Payer: Medicare Other | Attending: Emergency Medicine | Admitting: Emergency Medicine

## 2014-11-20 ENCOUNTER — Encounter (HOSPITAL_COMMUNITY): Payer: Self-pay

## 2014-11-20 DIAGNOSIS — I10 Essential (primary) hypertension: Secondary | ICD-10-CM | POA: Diagnosis not present

## 2014-11-20 DIAGNOSIS — Z792 Long term (current) use of antibiotics: Secondary | ICD-10-CM | POA: Insufficient documentation

## 2014-11-20 DIAGNOSIS — Z79899 Other long term (current) drug therapy: Secondary | ICD-10-CM | POA: Insufficient documentation

## 2014-11-20 DIAGNOSIS — Z8719 Personal history of other diseases of the digestive system: Secondary | ICD-10-CM | POA: Diagnosis not present

## 2014-11-20 DIAGNOSIS — E119 Type 2 diabetes mellitus without complications: Secondary | ICD-10-CM | POA: Diagnosis not present

## 2014-11-20 DIAGNOSIS — Z720 Tobacco use: Secondary | ICD-10-CM | POA: Diagnosis not present

## 2014-11-20 DIAGNOSIS — L0201 Cutaneous abscess of face: Secondary | ICD-10-CM | POA: Diagnosis present

## 2014-11-20 MED ORDER — DOXYCYCLINE HYCLATE 100 MG PO CAPS: 100.0000 mg | ORAL_CAPSULE | Freq: Two times a day (BID) | ORAL | Status: DC

## 2014-11-20 MED ORDER — LIDOCAINE-EPINEPHRINE (PF) 2 %-1:200000 IJ SOLN
INTRAMUSCULAR | Status: AC
  Administered 2014-11-20: 10:00:00
  Filled 2014-11-20: qty 20

## 2014-11-20 MED ORDER — HYDROCODONE-ACETAMINOPHEN 5-325 MG PO TABS: 1.0000 | ORAL_TABLET | ORAL | Status: DC | PRN

## 2014-11-20 NOTE — ED Notes (Signed)
Patient with no complaints at this time. Respirations even and unlabored. Skin warm/dry. Discharge instructions reviewed with patient at this time. Patient given opportunity to voice concerns/ask questions. Patient discharged at this time and left Emergency Department with steady gait.   
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
## 2014-11-20 NOTE — ED Notes (Signed)
Sterile non-adherent dressing applied. Wound care instructions given. Patient with no complaints at this time. Respirations even and unlabored. Skin warm/dry. Discharge instructions reviewed with patient at this time. Patient given opportunity to voice concerns/ask questions. Patient discharged at this time and left Emergency Department with steady gait.

## 2014-11-20 NOTE — ED Notes (Signed)
Pt reports abscess to left side of face x 1 week.

## 2014-11-20 NOTE — Discharge Instructions (Signed)
Abscess °An abscess is an infected area that contains a collection of pus and debris. It can occur in almost any part of the body. An abscess is also known as a furuncle or boil. °CAUSES  °An abscess occurs when tissue gets infected. This can occur from blockage of oil or sweat glands, infection of hair follicles, or a minor injury to the skin. As the body tries to fight the infection, pus collects in the area and creates pressure under the skin. This pressure causes pain. People with weakened immune systems have difficulty fighting infections and get certain abscesses more often.  °SYMPTOMS °Usually an abscess develops on the skin and becomes a painful mass that is red, warm, and tender. If the abscess forms under the skin, you may feel a moveable soft area under the skin. Some abscesses break open (rupture) on their own, but most will continue to get worse without care. The infection can spread deeper into the body and eventually into the bloodstream, causing you to feel ill.  °DIAGNOSIS  °Your caregiver will take your medical history and perform a physical exam. A sample of fluid may also be taken from the abscess to determine what is causing your infection. °TREATMENT  °Your caregiver may prescribe antibiotic medicines to fight the infection. However, taking antibiotics alone usually does not cure an abscess. Your caregiver may need to make a small cut (incision) in the abscess to drain the pus. In some cases, gauze is packed into the abscess to reduce pain and to continue draining the area. °HOME CARE INSTRUCTIONS  °· Only take over-the-counter or prescription medicines for pain, discomfort, or fever as directed by your caregiver. °· If you were prescribed antibiotics, take them as directed. Finish them even if you start to feel better. °· If gauze is used, follow your caregiver's directions for changing the gauze. °· To avoid spreading the infection: °· Keep your draining abscess covered with a  bandage. °· Wash your hands well. °· Do not share personal care items, towels, or whirlpools with others. °· Avoid skin contact with others. °· Keep your skin and clothes clean around the abscess. °· Keep all follow-up appointments as directed by your caregiver. °SEEK MEDICAL CARE IF:  °· You have increased pain, swelling, redness, fluid drainage, or bleeding. °· You have muscle aches, chills, or a general ill feeling. °· You have a fever. °MAKE SURE YOU:  °· Understand these instructions. °· Will watch your condition. °· Will get help right away if you are not doing well or get worse. °Document Released: 06/13/2005 Document Revised: 03/04/2012 Document Reviewed: 11/16/2011 °ExitCare® Patient Information ©2015 [employer]. This information is not intended to replace advice given to you by your health care provider. Make sure you discuss any questions you have with your health care provider. ° °Abscess °Care After °An abscess (also called a boil or furuncle) is an infected area that contains a collection of pus. Signs and symptoms of an abscess include pain, tenderness, redness, or hardness, or you may feel a moveable soft area under your skin. An abscess can occur anywhere in the body. The infection may spread to surrounding tissues causing cellulitis. A cut (incision) by the surgeon was made over your abscess and the pus was drained out. Gauze may have been packed into the space to provide a drain that will allow the cavity to heal from the inside outwards. The boil may be painful for 5 to 7 days. Most people with a boil do not have   high fevers. Your abscess, if seen early, may not have localized, and may not have been lanced. If not, another appointment may be required for this if it does not get better on its own or with medications. HOME CARE INSTRUCTIONS   Only take over-the-counter or prescription medicines for pain, discomfort, or fever as directed by your caregiver.  The packing should be removed in 2  days at which time you may then wash the wound gently with mild soapy water. Warm water compress applied to the site twice daily is recommended once the packing is removed. SEEK IMMEDIATE MEDICAL CARE IF:   You develop increased pain, swelling, redness, drainage, or bleeding in the wound site.  You develop signs of generalized infection including muscle aches, chills, fever, or a general ill feeling.  An oral temperature above 102 F (38.9 C) develops, not controlled by medication. See your caregiver for a recheck if you develop any of the symptoms described above. If medications (antibiotics) were prescribed, take them as directed. Document Released: 03/22/2005 Document Revised: 11/26/2011 Document Reviewed: 11/17/2007 [facility] Patient Information 2015 [employer], [place]. This information is not intended to replace advice given to you by your health care provider. Make sure you discuss any questions you have with your health care provider.

## 2015-01-17 LAB — HEMOGLOBIN A1C: Hgb A1c MFr Bld: 7.5 % — AB (ref 4.0–6.0)

## 2015-02-15 ENCOUNTER — Emergency Department (HOSPITAL_COMMUNITY)
Admission: EM | Admit: 2015-02-15 | Discharge: 2015-02-15 | Disposition: A | Discharge status: Home / Self Care | Payer: Medicare Other | Attending: Emergency Medicine | Admitting: Emergency Medicine

## 2015-02-15 ENCOUNTER — Encounter (HOSPITAL_COMMUNITY): Payer: Self-pay

## 2015-02-15 DIAGNOSIS — Z720 Tobacco use: Secondary | ICD-10-CM | POA: Diagnosis not present

## 2015-02-15 DIAGNOSIS — Z79899 Other long term (current) drug therapy: Secondary | ICD-10-CM | POA: Diagnosis not present

## 2015-02-15 DIAGNOSIS — Z8719 Personal history of other diseases of the digestive system: Secondary | ICD-10-CM | POA: Diagnosis not present

## 2015-02-15 DIAGNOSIS — E119 Type 2 diabetes mellitus without complications: Secondary | ICD-10-CM | POA: Diagnosis not present

## 2015-02-15 DIAGNOSIS — I10 Essential (primary) hypertension: Secondary | ICD-10-CM | POA: Insufficient documentation

## 2015-02-15 DIAGNOSIS — L0201 Cutaneous abscess of face: Secondary | ICD-10-CM | POA: Diagnosis not present

## 2015-02-15 DIAGNOSIS — L729 Follicular cyst of the skin and subcutaneous tissue, unspecified: Secondary | ICD-10-CM | POA: Diagnosis present

## 2015-02-15 MED ORDER — HYDROCODONE-ACETAMINOPHEN 5-325 MG PO TABS: ORAL_TABLET | ORAL | Status: DC

## 2015-02-15 MED ORDER — LIDOCAINE-EPINEPHRINE (PF) 2 %-1:200000 IJ SOLN
10.0000 mL | Freq: Once | INTRAMUSCULAR | Status: AC
  Administered 2015-02-15: 10 mL via INTRADERMAL
  Filled 2015-02-15: qty 20

## 2015-02-15 MED ORDER — POVIDONE-IODINE 10 % EX SOLN
CUTANEOUS | Status: AC
  Administered 2015-02-15: 11:00:00
  Filled 2015-02-15: qty 118

## 2015-02-15 MED ORDER — DOXYCYCLINE HYCLATE 100 MG PO CAPS: 100.0000 mg | ORAL_CAPSULE | Freq: Two times a day (BID) | ORAL | Status: DC

## 2015-02-15 NOTE — ED Notes (Signed)
Pt alert & oriented x4, stable gait. Patient given discharge instructions, paperwork & prescription(s). Patient  instructed to stop at the registration desk to finish any additional paperwork. Patient verbalized understanding. Pt left department w/ no further questions. 
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
## 2015-02-15 NOTE — Discharge Instructions (Signed)
Abscess °An abscess (boil or furuncle) is an infected area on or under the skin. This area is filled with yellowish-white fluid (pus) and other material (debris). °HOME CARE  °· Only take medicines as told by your doctor. °· If you were given antibiotic medicine, take it as directed. Finish the medicine even if you start to feel better. °· If gauze is used, follow your doctor's directions for changing the gauze. °· To avoid spreading the infection: °¨ Keep your abscess covered with a bandage. °¨ Wash your hands well. °¨ Do not share personal care items, towels, or whirlpools with others. °¨ Avoid skin contact with others. °· Keep your skin and clothes clean around the abscess. °· Keep all doctor visits as told. °GET HELP RIGHT AWAY IF:  °· You have more pain, puffiness (swelling), or redness in the wound site. °· You have more fluid or blood coming from the wound site. °· You have muscle aches, chills, or you feel sick. °· You have a fever. °MAKE SURE YOU:  °· Understand these instructions. °· Will watch your condition. °· Will get help right away if you are not doing well or get worse. °Document Released: 02/20/2008 Document Revised: 03/04/2012 Document Reviewed: 11/16/2011 °ExitCare® Patient Information ©2015 [employer]. This information is not intended to replace advice given to you by your health care provider. Make sure you discuss any questions you have with your health care provider. ° °

## 2015-06-27 LAB — HEMOGLOBIN A1C: HEMOGLOBIN A1C: 9.6 % — AB (ref 4.0–6.0)

## 2015-07-14 ENCOUNTER — Encounter: Payer: Self-pay | Admitting: "Endocrinology

## 2015-07-14 ENCOUNTER — Ambulatory Visit (INDEPENDENT_AMBULATORY_CARE_PROVIDER_SITE_OTHER): Payer: Medicare Other | Admitting: "Endocrinology

## 2015-07-14 VITALS — BP 124/88 | HR 83 | Ht 73.0 in | Wt 171.0 lb

## 2015-07-14 DIAGNOSIS — E108 Type 1 diabetes mellitus with unspecified complications: Secondary | ICD-10-CM

## 2015-07-14 DIAGNOSIS — E1065 Type 1 diabetes mellitus with hyperglycemia: Secondary | ICD-10-CM | POA: Diagnosis not present

## 2015-07-14 DIAGNOSIS — K8681 Exocrine pancreatic insufficiency: Secondary | ICD-10-CM

## 2015-07-14 DIAGNOSIS — K8689 Other specified diseases of pancreas: Secondary | ICD-10-CM | POA: Diagnosis not present

## 2015-07-14 DIAGNOSIS — IMO0002 Reserved for concepts with insufficient information to code with codable children: Secondary | ICD-10-CM

## 2015-07-14 DIAGNOSIS — I10 Essential (primary) hypertension: Secondary | ICD-10-CM | POA: Diagnosis not present

## 2015-07-14 MED ORDER — PANCRELIPASE (LIP-PROT-AMYL) 12000-38000 UNITS PO CPEP: 24000.0000 [IU] | ORAL_CAPSULE | Freq: Three times a day (TID) | ORAL | Status: DC

## 2015-07-14 MED ORDER — METFORMIN HCL 500 MG PO TABS: 500.0000 mg | ORAL_TABLET | Freq: Two times a day (BID) | ORAL | Status: DC
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
## 2015-07-14 NOTE — Patient Instructions (Signed)
Glucose control  -It is better to avoid simple carbohydrates including: Cakes, Desserts, Ice Cream, Soda (diet and regular), Sweet Tea, Candies, Chips, Cookies, Artificial Sweeteners, and "Sugar-free" Products.   -If you are interested, we can schedule a visit with Norm SaltPenny Crumpton, RDN, CDE for individualized nutrition education.

## 2015-07-26 ENCOUNTER — Ambulatory Visit (INDEPENDENT_AMBULATORY_CARE_PROVIDER_SITE_OTHER): Payer: Medicare Other | Admitting: "Endocrinology

## 2015-07-26 ENCOUNTER — Encounter: Payer: Self-pay | Admitting: "Endocrinology

## 2015-07-26 VITALS — BP 129/87 | HR 74 | Ht 73.0 in | Wt 171.0 lb

## 2015-07-26 DIAGNOSIS — I10 Essential (primary) hypertension: Secondary | ICD-10-CM | POA: Diagnosis not present

## 2015-07-26 DIAGNOSIS — E1065 Type 1 diabetes mellitus with hyperglycemia: Secondary | ICD-10-CM | POA: Diagnosis not present

## 2015-07-26 DIAGNOSIS — E108 Type 1 diabetes mellitus with unspecified complications: Secondary | ICD-10-CM | POA: Diagnosis not present

## 2015-07-26 DIAGNOSIS — IMO0002 Reserved for concepts with insufficient information to code with codable children: Secondary | ICD-10-CM

## 2015-07-26 DIAGNOSIS — K8689 Other specified diseases of pancreas: Secondary | ICD-10-CM | POA: Diagnosis not present

## 2015-07-26 MED ORDER — INSULIN PEN NEEDLE 32G X 4 MM MISC: 1.0000 | Freq: Four times a day (QID) | Status: DC

## 2015-07-26 MED ORDER — GLUCOSE BLOOD VI STRP: ORAL_STRIP | Status: DC

## 2015-07-26 MED ORDER — INSULIN DETEMIR 100 UNIT/ML FLEXPEN: 14.0000 [IU] | PEN_INJECTOR | Freq: Every day | SUBCUTANEOUS | Status: DC
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
## 2015-09-29 LAB — HEMOGLOBIN A1C: Hemoglobin A1C: 9.8

## 2015-10-04 ENCOUNTER — Ambulatory Visit (INDEPENDENT_AMBULATORY_CARE_PROVIDER_SITE_OTHER): Payer: Medicare Other | Admitting: "Endocrinology

## 2015-10-04 ENCOUNTER — Encounter: Payer: Self-pay | Admitting: "Endocrinology

## 2015-10-04 VITALS — BP 128/89 | HR 65 | Ht 73.0 in | Wt 177.0 lb

## 2015-10-04 DIAGNOSIS — E108 Type 1 diabetes mellitus with unspecified complications: Secondary | ICD-10-CM

## 2015-10-04 DIAGNOSIS — E1065 Type 1 diabetes mellitus with hyperglycemia: Secondary | ICD-10-CM

## 2015-10-04 DIAGNOSIS — K860 Alcohol-induced chronic pancreatitis: Secondary | ICD-10-CM

## 2015-10-04 DIAGNOSIS — I10 Essential (primary) hypertension: Secondary | ICD-10-CM | POA: Diagnosis not present

## 2015-10-04 DIAGNOSIS — IMO0002 Reserved for concepts with insufficient information to code with codable children: Secondary | ICD-10-CM
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
## 2015-10-21 ENCOUNTER — Ambulatory Visit (INDEPENDENT_AMBULATORY_CARE_PROVIDER_SITE_OTHER): Payer: Medicare Other | Admitting: "Endocrinology

## 2015-10-21 ENCOUNTER — Encounter: Payer: Self-pay | Admitting: "Endocrinology

## 2015-10-21 VITALS — BP 136/86 | HR 98 | Ht 73.0 in | Wt 174.0 lb

## 2015-10-21 DIAGNOSIS — E1065 Type 1 diabetes mellitus with hyperglycemia: Secondary | ICD-10-CM | POA: Diagnosis not present

## 2015-10-21 DIAGNOSIS — I10 Essential (primary) hypertension: Secondary | ICD-10-CM

## 2015-10-21 DIAGNOSIS — E108 Type 1 diabetes mellitus with unspecified complications: Secondary | ICD-10-CM

## 2015-10-21 DIAGNOSIS — IMO0002 Reserved for concepts with insufficient information to code with codable children: Secondary | ICD-10-CM

## 2015-10-21 MED ORDER — INSULIN DETEMIR 100 UNIT/ML FLEXPEN: 24.0000 [IU] | PEN_INJECTOR | Freq: Every day | SUBCUTANEOUS | Status: DC
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
## 2016-01-24 ENCOUNTER — Ambulatory Visit: Payer: Medicare Other | Admitting: "Endocrinology

## 2016-01-26 ENCOUNTER — Other Ambulatory Visit: Payer: Self-pay | Admitting: "Endocrinology

## 2016-01-26 LAB — HEMOGLOBIN A1C
HEMOGLOBIN A1C: 9.1 % — AB (ref <5.7)
MEAN PLASMA GLUCOSE: 214 mg/dL

## 2016-01-26 LAB — BASIC METABOLIC PANEL
BUN: 6 mg/dL — AB (ref 7–25)
CALCIUM: 9.3 mg/dL (ref 8.6–10.3)
CO2: 26 mmol/L (ref 20–31)
Chloride: 100 mmol/L (ref 98–110)
Creat: 0.88 mg/dL (ref 0.60–1.35)
GLUCOSE: 203 mg/dL — AB (ref 65–99)
POTASSIUM: 4.6 mmol/L (ref 3.5–5.3)
SODIUM: 136 mmol/L (ref 135–146)

## 2016-02-09 ENCOUNTER — Encounter: Payer: Self-pay | Admitting: "Endocrinology

## 2016-02-09 ENCOUNTER — Ambulatory Visit (INDEPENDENT_AMBULATORY_CARE_PROVIDER_SITE_OTHER): Payer: Medicare Other | Admitting: "Endocrinology

## 2016-02-09 VITALS — BP 132/88 | HR 88 | Ht 73.0 in | Wt 175.0 lb

## 2016-02-09 DIAGNOSIS — K8689 Other specified diseases of pancreas: Secondary | ICD-10-CM

## 2016-02-09 DIAGNOSIS — I10 Essential (primary) hypertension: Secondary | ICD-10-CM

## 2016-02-09 DIAGNOSIS — E1065 Type 1 diabetes mellitus with hyperglycemia: Secondary | ICD-10-CM

## 2016-02-09 DIAGNOSIS — IMO0002 Reserved for concepts with insufficient information to code with codable children: Secondary | ICD-10-CM

## 2016-02-09 DIAGNOSIS — E108 Type 1 diabetes mellitus with unspecified complications: Secondary | ICD-10-CM

## 2016-02-09 MED ORDER — PANCRELIPASE (LIP-PROT-AMYL) 12000-38000 UNITS PO CPEP: 24000.0000 [IU] | ORAL_CAPSULE | Freq: Three times a day (TID) | ORAL | Status: DC
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
## 2016-03-16 ENCOUNTER — Ambulatory Visit (INDEPENDENT_AMBULATORY_CARE_PROVIDER_SITE_OTHER): Payer: Medicare Other | Admitting: "Endocrinology

## 2016-03-16 ENCOUNTER — Encounter: Payer: Self-pay | Admitting: "Endocrinology

## 2016-03-16 VITALS — BP 130/82 | HR 76 | Ht 73.0 in | Wt 171.0 lb

## 2016-03-16 DIAGNOSIS — I10 Essential (primary) hypertension: Secondary | ICD-10-CM | POA: Diagnosis not present

## 2016-03-16 DIAGNOSIS — E1065 Type 1 diabetes mellitus with hyperglycemia: Secondary | ICD-10-CM | POA: Diagnosis not present

## 2016-03-16 DIAGNOSIS — E108 Type 1 diabetes mellitus with unspecified complications: Secondary | ICD-10-CM | POA: Diagnosis not present

## 2016-03-16 DIAGNOSIS — K8689 Other specified diseases of pancreas: Secondary | ICD-10-CM

## 2016-03-16 DIAGNOSIS — IMO0002 Reserved for concepts with insufficient information to code with codable children: Secondary | ICD-10-CM

## 2016-03-16 MED ORDER — INSULIN ASPART PROT & ASPART (70-30 MIX) 100 UNIT/ML PEN: 20.0000 [IU] | PEN_INJECTOR | Freq: Two times a day (BID) | SUBCUTANEOUS | Status: DC
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
## 2016-03-30 ENCOUNTER — Ambulatory Visit (INDEPENDENT_AMBULATORY_CARE_PROVIDER_SITE_OTHER): Payer: Medicare Other | Admitting: "Endocrinology

## 2016-03-30 ENCOUNTER — Encounter: Payer: Self-pay | Admitting: "Endocrinology

## 2016-03-30 VITALS — BP 133/83 | HR 80 | Ht 73.0 in | Wt 171.0 lb

## 2016-03-30 DIAGNOSIS — IMO0002 Reserved for concepts with insufficient information to code with codable children: Secondary | ICD-10-CM

## 2016-03-30 DIAGNOSIS — K8689 Other specified diseases of pancreas: Secondary | ICD-10-CM

## 2016-03-30 DIAGNOSIS — E108 Type 1 diabetes mellitus with unspecified complications: Secondary | ICD-10-CM

## 2016-03-30 DIAGNOSIS — I10 Essential (primary) hypertension: Secondary | ICD-10-CM | POA: Diagnosis not present

## 2016-03-30 DIAGNOSIS — E1065 Type 1 diabetes mellitus with hyperglycemia: Secondary | ICD-10-CM

## 2016-03-30 MED ORDER — INSULIN ASPART PROT & ASPART (70-30 MIX) 100 UNIT/ML PEN: PEN_INJECTOR | SUBCUTANEOUS | Status: DC
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
## 2016-05-11 ENCOUNTER — Other Ambulatory Visit: Payer: Self-pay | Admitting: "Endocrinology

## 2016-05-12 LAB — COMPLETE METABOLIC PANEL WITH GFR
ALBUMIN: 4.3 g/dL (ref 3.6–5.1)
ALK PHOS: 109 U/L (ref 40–115)
ALT: 68 U/L — AB (ref 9–46)
AST: 86 U/L — ABNORMAL HIGH (ref 10–40)
BILIRUBIN TOTAL: 0.7 mg/dL (ref 0.2–1.2)
BUN: 13 mg/dL (ref 7–25)
CO2: 20 mmol/L (ref 20–31)
CREATININE: 0.87 mg/dL (ref 0.60–1.35)
Calcium: 9.8 mg/dL (ref 8.6–10.3)
Chloride: 102 mmol/L (ref 98–110)
GFR, Est Non African American: >89 mL/min (ref >=60)
GLUCOSE: 274 mg/dL — AB (ref 65–99)
Potassium: 4.8 mmol/L (ref 3.5–5.3)
Sodium: 136 mmol/L (ref 135–146)
TOTAL PROTEIN: 7.4 g/dL (ref 6.1–8.1)

## 2016-05-12 LAB — HEMOGLOBIN A1C
HEMOGLOBIN A1C: 11.5 % — AB (ref <5.7)
Mean Plasma Glucose: 283 mg/dL

## 2016-05-17 ENCOUNTER — Other Ambulatory Visit: Payer: Self-pay | Admitting: "Endocrinology

## 2016-05-17 ENCOUNTER — Encounter: Payer: Self-pay | Admitting: "Endocrinology

## 2016-05-17 ENCOUNTER — Ambulatory Visit (INDEPENDENT_AMBULATORY_CARE_PROVIDER_SITE_OTHER): Payer: Medicare Other | Admitting: "Endocrinology

## 2016-05-17 VITALS — BP 126/89 | HR 79 | Ht 73.0 in | Wt 170.0 lb

## 2016-05-17 DIAGNOSIS — E1065 Type 1 diabetes mellitus with hyperglycemia: Secondary | ICD-10-CM

## 2016-05-17 DIAGNOSIS — I10 Essential (primary) hypertension: Secondary | ICD-10-CM

## 2016-05-17 DIAGNOSIS — E108 Type 1 diabetes mellitus with unspecified complications: Secondary | ICD-10-CM

## 2016-05-17 DIAGNOSIS — F17200 Nicotine dependence, unspecified, uncomplicated: Secondary | ICD-10-CM

## 2016-05-17 DIAGNOSIS — IMO0002 Reserved for concepts with insufficient information to code with codable children: Secondary | ICD-10-CM

## 2016-05-17 DIAGNOSIS — K860 Alcohol-induced chronic pancreatitis: Secondary | ICD-10-CM | POA: Diagnosis not present

## 2016-05-17 DIAGNOSIS — F1010 Alcohol abuse, uncomplicated: Secondary | ICD-10-CM

## 2016-05-17 MED ORDER — INSULIN ASPART PROT & ASPART (70-30 MIX) 100 UNIT/ML PEN: PEN_INJECTOR | SUBCUTANEOUS | 2 refills | Status: DC

## 2016-05-17 MED ORDER — GLUCAGON (RDNA) 1 MG IJ KIT: 1.0000 mg | PACK | Freq: Once | INTRAMUSCULAR | 12 refills | Status: DC | PRN
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
## 2016-06-05 ENCOUNTER — Encounter: Payer: Self-pay | Admitting: "Endocrinology

## 2016-06-05 ENCOUNTER — Ambulatory Visit (INDEPENDENT_AMBULATORY_CARE_PROVIDER_SITE_OTHER): Payer: Medicare Other | Admitting: "Endocrinology

## 2016-06-05 VITALS — BP 145/95 | HR 80 | Ht 73.0 in | Wt 167.0 lb

## 2016-06-05 DIAGNOSIS — K8689 Other specified diseases of pancreas: Secondary | ICD-10-CM

## 2016-06-05 DIAGNOSIS — IMO0002 Reserved for concepts with insufficient information to code with codable children: Secondary | ICD-10-CM

## 2016-06-05 DIAGNOSIS — E1065 Type 1 diabetes mellitus with hyperglycemia: Secondary | ICD-10-CM

## 2016-06-05 DIAGNOSIS — E108 Type 1 diabetes mellitus with unspecified complications: Secondary | ICD-10-CM | POA: Diagnosis not present

## 2016-06-05 MED ORDER — INSULIN ASPART PROT & ASPART (70-30 MIX) 100 UNIT/ML PEN: PEN_INJECTOR | SUBCUTANEOUS | 2 refills | Status: DC
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
## 2016-06-11 ENCOUNTER — Other Ambulatory Visit: Payer: Self-pay | Admitting: "Endocrinology

## 2016-08-10 ENCOUNTER — Other Ambulatory Visit: Payer: Self-pay | Admitting: "Endocrinology

## 2016-08-14 ENCOUNTER — Other Ambulatory Visit: Payer: Self-pay | Admitting: "Endocrinology

## 2016-08-14 LAB — COMPREHENSIVE METABOLIC PANEL
ALBUMIN: 4.2 g/dL (ref 3.6–5.1)
ALK PHOS: 119 U/L — AB (ref 40–115)
ALT: 50 U/L — AB (ref 9–46)
AST: 83 U/L — AB (ref 10–40)
BILIRUBIN TOTAL: 0.9 mg/dL (ref 0.2–1.2)
BUN: 8 mg/dL (ref 7–25)
CALCIUM: 9.5 mg/dL (ref 8.6–10.3)
CO2: 23 mmol/L (ref 20–31)
CREATININE: 0.9 mg/dL (ref 0.60–1.35)
Chloride: 101 mmol/L (ref 98–110)
GLUCOSE: 260 mg/dL — AB (ref 65–99)
Potassium: 4.3 mmol/L (ref 3.5–5.3)
SODIUM: 137 mmol/L (ref 135–146)
Total Protein: 7.1 g/dL (ref 6.1–8.1)

## 2016-08-15 LAB — HEMOGLOBIN A1C
Hgb A1c MFr Bld: 11.6 % — ABNORMAL HIGH (ref <5.7)
MEAN PLASMA GLUCOSE: 286 mg/dL

## 2016-08-16 ENCOUNTER — Ambulatory Visit (INDEPENDENT_AMBULATORY_CARE_PROVIDER_SITE_OTHER): Payer: Medicare Other | Admitting: "Endocrinology

## 2016-08-16 ENCOUNTER — Encounter: Payer: Self-pay | Admitting: "Endocrinology

## 2016-08-16 VITALS — BP 121/84 | HR 74 | Ht 73.0 in | Wt 163.0 lb

## 2016-08-16 DIAGNOSIS — E108 Type 1 diabetes mellitus with unspecified complications: Secondary | ICD-10-CM

## 2016-08-16 DIAGNOSIS — IMO0002 Reserved for concepts with insufficient information to code with codable children: Secondary | ICD-10-CM

## 2016-08-16 DIAGNOSIS — E1065 Type 1 diabetes mellitus with hyperglycemia: Secondary | ICD-10-CM | POA: Diagnosis not present

## 2016-08-16 DIAGNOSIS — K8689 Other specified diseases of pancreas: Secondary | ICD-10-CM | POA: Diagnosis not present

## 2016-08-16 MED ORDER — INSULIN ASPART PROT & ASPART (70-30 MIX) 100 UNIT/ML PEN: PEN_INJECTOR | SUBCUTANEOUS | 2 refills | Status: DC
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
## 2016-08-28 ENCOUNTER — Emergency Department (HOSPITAL_COMMUNITY)
Admission: EM | Admit: 2016-08-28 | Discharge: 2016-08-28 | Disposition: A | Discharge status: Home / Self Care | Payer: Medicare Other | Attending: Emergency Medicine | Admitting: Emergency Medicine

## 2016-08-28 ENCOUNTER — Encounter (HOSPITAL_COMMUNITY): Payer: Self-pay | Admitting: Emergency Medicine

## 2016-08-28 DIAGNOSIS — F17200 Nicotine dependence, unspecified, uncomplicated: Secondary | ICD-10-CM | POA: Diagnosis not present

## 2016-08-28 DIAGNOSIS — E109 Type 1 diabetes mellitus without complications: Secondary | ICD-10-CM | POA: Insufficient documentation

## 2016-08-28 DIAGNOSIS — Y999 Unspecified external cause status: Secondary | ICD-10-CM | POA: Diagnosis not present

## 2016-08-28 DIAGNOSIS — Y929 Unspecified place or not applicable: Secondary | ICD-10-CM | POA: Insufficient documentation

## 2016-08-28 DIAGNOSIS — Z23 Encounter for immunization: Secondary | ICD-10-CM | POA: Diagnosis not present

## 2016-08-28 DIAGNOSIS — I10 Essential (primary) hypertension: Secondary | ICD-10-CM | POA: Insufficient documentation

## 2016-08-28 DIAGNOSIS — X58XXXA Exposure to other specified factors, initial encounter: Secondary | ICD-10-CM | POA: Diagnosis not present

## 2016-08-28 DIAGNOSIS — S91302A Unspecified open wound, left foot, initial encounter: Secondary | ICD-10-CM

## 2016-08-28 DIAGNOSIS — Z79899 Other long term (current) drug therapy: Secondary | ICD-10-CM | POA: Insufficient documentation

## 2016-08-28 DIAGNOSIS — Y939 Activity, unspecified: Secondary | ICD-10-CM | POA: Insufficient documentation

## 2016-08-28 MED ORDER — CLINDAMYCIN HCL 150 MG PO CAPS
300.0000 mg | ORAL_CAPSULE | Freq: Once | ORAL | Status: AC
  Administered 2016-08-28: 300 mg via ORAL
  Filled 2016-08-28: qty 2

## 2016-08-28 MED ORDER — TETANUS-DIPHTH-ACELL PERTUSSIS 5-2.5-18.5 LF-MCG/0.5 IM SUSP
0.5000 mL | Freq: Once | INTRAMUSCULAR | Status: AC
  Administered 2016-08-28: 0.5 mL via INTRAMUSCULAR
  Filled 2016-08-28: qty 0.5

## 2016-08-28 MED ORDER — FENTANYL CITRATE (PF) 100 MCG/2ML IJ SOLN: 50.0000 ug | INTRAMUSCULAR | Status: DC | PRN

## 2016-08-28 MED ORDER — CLINDAMYCIN HCL 300 MG PO CAPS: 300.0000 mg | ORAL_CAPSULE | Freq: Three times a day (TID) | ORAL | 0 refills | Status: DC

## 2016-08-28 NOTE — ED Triage Notes (Signed)
Pt reports discharge to bottom of left foot for last several weeks. Pt reports has been treating site with peroxide and unaware of any known injury. Pt able to bear weight on LLE but reports no improvement in site.
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
## 2016-09-21 ENCOUNTER — Emergency Department (HOSPITAL_COMMUNITY)
Admission: EM | Admit: 2016-09-21 | Discharge: 2016-09-21 | Disposition: A | Discharge status: Home / Self Care | Payer: Medicare Other | Attending: Emergency Medicine | Admitting: Emergency Medicine

## 2016-09-21 ENCOUNTER — Encounter (HOSPITAL_COMMUNITY): Payer: Self-pay | Admitting: Emergency Medicine

## 2016-09-21 ENCOUNTER — Emergency Department (HOSPITAL_COMMUNITY): Payer: Medicare Other

## 2016-09-21 DIAGNOSIS — R109 Unspecified abdominal pain: Secondary | ICD-10-CM | POA: Insufficient documentation

## 2016-09-21 DIAGNOSIS — S99922A Unspecified injury of left foot, initial encounter: Secondary | ICD-10-CM | POA: Diagnosis not present

## 2016-09-21 DIAGNOSIS — Z79899 Other long term (current) drug therapy: Secondary | ICD-10-CM | POA: Insufficient documentation

## 2016-09-21 DIAGNOSIS — Y999 Unspecified external cause status: Secondary | ICD-10-CM | POA: Insufficient documentation

## 2016-09-21 DIAGNOSIS — M79671 Pain in right foot: Secondary | ICD-10-CM | POA: Diagnosis not present

## 2016-09-21 DIAGNOSIS — Y929 Unspecified place or not applicable: Secondary | ICD-10-CM | POA: Diagnosis not present

## 2016-09-21 DIAGNOSIS — I10 Essential (primary) hypertension: Secondary | ICD-10-CM | POA: Diagnosis not present

## 2016-09-21 DIAGNOSIS — S91332A Puncture wound without foreign body, left foot, initial encounter: Secondary | ICD-10-CM | POA: Diagnosis not present

## 2016-09-21 DIAGNOSIS — F17200 Nicotine dependence, unspecified, uncomplicated: Secondary | ICD-10-CM | POA: Diagnosis not present

## 2016-09-21 DIAGNOSIS — Y939 Activity, unspecified: Secondary | ICD-10-CM | POA: Insufficient documentation

## 2016-09-21 DIAGNOSIS — W450XXA Nail entering through skin, initial encounter: Secondary | ICD-10-CM | POA: Insufficient documentation

## 2016-09-21 LAB — COMPREHENSIVE METABOLIC PANEL
ALK PHOS: 112 U/L (ref 38–126)
ALT: 25 U/L (ref 17–63)
AST: 40 U/L (ref 15–41)
Albumin: 4.2 g/dL (ref 3.5–5.0)
Anion gap: 8 (ref 5–15)
BUN: 6 mg/dL (ref 6–20)
CALCIUM: 9.7 mg/dL (ref 8.9–10.3)
CHLORIDE: 103 mmol/L (ref 101–111)
CO2: 26 mmol/L (ref 22–32)
CREATININE: 0.8 mg/dL (ref 0.61–1.24)
GFR calc non Af Amer: >60 mL/min (ref >60)
GLUCOSE: 272 mg/dL — AB (ref 65–99)
Potassium: 4.2 mmol/L (ref 3.5–5.1)
SODIUM: 137 mmol/L (ref 135–145)
Total Bilirubin: 0.6 mg/dL (ref 0.3–1.2)
Total Protein: 8 g/dL (ref 6.5–8.1)

## 2016-09-21 LAB — CBC WITH DIFFERENTIAL/PLATELET
Basophils Absolute: 0 10*3/uL (ref 0.0–0.1)
Basophils Relative: 0 %
Eosinophils Absolute: 0.1 10*3/uL (ref 0.0–0.7)
Eosinophils Relative: 1 %
HCT: 46 % (ref 39.0–52.0)
Hemoglobin: 15.6 g/dL (ref 13.0–17.0)
Lymphocytes Relative: 27 %
Lymphs Abs: 1.9 10*3/uL (ref 0.7–4.0)
MCH: 34.5 pg — ABNORMAL HIGH (ref 26.0–34.0)
MCHC: 33.9 g/dL (ref 30.0–36.0)
MCV: 101.8 fL — ABNORMAL HIGH (ref 78.0–100.0)
Monocytes Absolute: 0.8 10*3/uL (ref 0.1–1.0)
Monocytes Relative: 11 %
Neutro Abs: 4.2 10*3/uL (ref 1.7–7.7)
Neutrophils Relative %: 61 %
Platelets: 199 10*3/uL (ref 150–400)
RBC: 4.52 MIL/uL (ref 4.22–5.81)
RDW: 12.6 % (ref 11.5–15.5)
WBC: 7 10*3/uL (ref 4.0–10.5)

## 2016-09-21 LAB — CBG MONITORING, ED: Glucose-Capillary: 312 mg/dL — ABNORMAL HIGH (ref 65–99)

## 2016-09-21 MED ORDER — CIPROFLOXACIN HCL 500 MG PO TABS: 500.0000 mg | ORAL_TABLET | Freq: Two times a day (BID) | ORAL | 0 refills | Status: DC

## 2016-09-21 NOTE — Discharge Instructions (Signed)
Follow-up with Dr. Pricilla Holmucker next week. When you call you make sure they know that you were referred from the emergency department

## 2016-09-21 NOTE — ED Triage Notes (Signed)
Patient states he stepped on a nail last week and was treated here. Patient states that he now had drainage from wound. Patient states history of DM.
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
## 2016-09-22 ENCOUNTER — Other Ambulatory Visit: Payer: Self-pay | Admitting: "Endocrinology

## 2016-10-12 ENCOUNTER — Other Ambulatory Visit: Payer: Self-pay | Admitting: "Endocrinology

## 2016-10-22 ENCOUNTER — Encounter: Payer: Self-pay | Admitting: Podiatry

## 2016-10-22 ENCOUNTER — Ambulatory Visit (INDEPENDENT_AMBULATORY_CARE_PROVIDER_SITE_OTHER): Payer: Medicare Other | Admitting: Podiatry

## 2016-10-22 DIAGNOSIS — E11621 Type 2 diabetes mellitus with foot ulcer: Secondary | ICD-10-CM

## 2016-10-22 DIAGNOSIS — B351 Tinea unguium: Secondary | ICD-10-CM

## 2016-10-22 DIAGNOSIS — L97522 Non-pressure chronic ulcer of other part of left foot with fat layer exposed: Secondary | ICD-10-CM

## 2016-10-22 DIAGNOSIS — M79676 Pain in unspecified toe(s): Secondary | ICD-10-CM

## 2016-10-22 MED ORDER — CEPHALEXIN 500 MG PO CAPS: 500.0000 mg | ORAL_CAPSULE | Freq: Three times a day (TID) | ORAL | 1 refills | Status: AC
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
## 2016-11-05 ENCOUNTER — Encounter: Payer: Self-pay | Admitting: Podiatry

## 2016-11-05 ENCOUNTER — Ambulatory Visit (INDEPENDENT_AMBULATORY_CARE_PROVIDER_SITE_OTHER): Payer: Medicare Other | Admitting: Podiatry

## 2016-11-05 DIAGNOSIS — E11621 Type 2 diabetes mellitus with foot ulcer: Secondary | ICD-10-CM | POA: Diagnosis not present

## 2016-11-05 DIAGNOSIS — L97522 Non-pressure chronic ulcer of other part of left foot with fat layer exposed: Secondary | ICD-10-CM

## 2016-11-05 NOTE — Progress Notes (Signed)
This patient returns to the office today for reevaluation of his left forefoot. He had developed a diabetic ulcer and a probable infection to his left forefoot as treated by myself with debridement and dressing as well as a prescription for cephalexin was written. There is no obvious signs of an infection, but the whole left forefoot was inflamed and hot.   He was instructed and has been soaking his foot and bandaging his foot as directed. Has been ambulating with an OrthoWedge shoe states that his ulcer is still open at this visit  Neurovascular status is intact for the vascular, neurologic and musculoskeletal reporting  Dermatologic  there is a persistent local ulcer, sub-tibial sesamoid of the left foot. This continues to measure approximately 5 mm x 5 mm no evidence of any redness or drainage noted healthier tissue is noted at the site of the ulcer at this visit, no malodor noted   Diabetic Ulcer left foot   ROV.  Examination of the ulcer reveals the ulcer is measuring the same size but healthier granulation tissue is noted. Minimal evidence of any calluses noted around the ulcer itself.  No drainage or malodor is noted. This ulcer appears to be doing better, especially the left forefoot is no longer inflamed or hot and therefore not infected at this visit. I told the patient to continue soaking and bandaging the foot as prescribed. He is returning the office in 3 weeks for further evaluation and treatment.  If this condition worsens or becomes very painful, the patient was told to contact this office or go to the Emergency Department at the hospital.  Helane GuntherGregory Jeannett Dekoning DPM   Helane GuntherGregory Harish Bram DPM

## 2016-11-08 ENCOUNTER — Other Ambulatory Visit: Payer: Self-pay | Admitting: "Endocrinology

## 2016-11-14 ENCOUNTER — Other Ambulatory Visit: Payer: Self-pay | Admitting: "Endocrinology

## 2016-11-14 DIAGNOSIS — E108 Type 1 diabetes mellitus with unspecified complications: Secondary | ICD-10-CM | POA: Diagnosis not present

## 2016-11-14 DIAGNOSIS — E1065 Type 1 diabetes mellitus with hyperglycemia: Secondary | ICD-10-CM | POA: Diagnosis not present

## 2016-11-14 LAB — HEMOGLOBIN A1C
Hgb A1c MFr Bld: 11.2 % — ABNORMAL HIGH (ref <5.7)
Mean Plasma Glucose: 275 mg/dL

## 2016-11-14 LAB — COMPREHENSIVE METABOLIC PANEL
ALK PHOS: 132 U/L — AB (ref 40–115)
ALT: 26 U/L (ref 9–46)
AST: 44 U/L — AB (ref 10–40)
Albumin: 3.9 g/dL (ref 3.6–5.1)
BILIRUBIN TOTAL: 0.9 mg/dL (ref 0.2–1.2)
BUN: 9 mg/dL (ref 7–25)
CALCIUM: 9.7 mg/dL (ref 8.6–10.3)
CO2: 19 mmol/L — ABNORMAL LOW (ref 20–31)
Chloride: 99 mmol/L (ref 98–110)
Creat: 0.91 mg/dL (ref 0.60–1.35)
GLUCOSE: 268 mg/dL — AB (ref 65–99)
Potassium: 4.6 mmol/L (ref 3.5–5.3)
Sodium: 133 mmol/L — ABNORMAL LOW (ref 135–146)
TOTAL PROTEIN: 7.5 g/dL (ref 6.1–8.1)

## 2016-11-19 ENCOUNTER — Encounter: Payer: Self-pay | Admitting: "Endocrinology

## 2016-11-19 ENCOUNTER — Ambulatory Visit (INDEPENDENT_AMBULATORY_CARE_PROVIDER_SITE_OTHER): Payer: Medicare Other | Admitting: "Endocrinology

## 2016-11-19 VITALS — BP 127/80 | HR 105 | Ht 73.0 in | Wt 159.0 lb

## 2016-11-19 DIAGNOSIS — E1065 Type 1 diabetes mellitus with hyperglycemia: Secondary | ICD-10-CM

## 2016-11-19 DIAGNOSIS — I10 Essential (primary) hypertension: Secondary | ICD-10-CM | POA: Diagnosis not present

## 2016-11-19 DIAGNOSIS — K8689 Other specified diseases of pancreas: Secondary | ICD-10-CM

## 2016-11-19 DIAGNOSIS — IMO0002 Reserved for concepts with insufficient information to code with codable children: Secondary | ICD-10-CM

## 2016-11-19 DIAGNOSIS — E108 Type 1 diabetes mellitus with unspecified complications: Secondary | ICD-10-CM

## 2016-11-19 DIAGNOSIS — F17200 Nicotine dependence, unspecified, uncomplicated: Secondary | ICD-10-CM

## 2016-11-19 DIAGNOSIS — F1010 Alcohol abuse, uncomplicated: Secondary | ICD-10-CM

## 2016-11-19 MED ORDER — INSULIN ASPART PROT & ASPART (70-30 MIX) 100 UNIT/ML PEN: PEN_INJECTOR | SUBCUTANEOUS | 2 refills | Status: DC
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
## 2016-11-26 ENCOUNTER — Ambulatory Visit (INDEPENDENT_AMBULATORY_CARE_PROVIDER_SITE_OTHER): Payer: Medicare Other | Admitting: Podiatry

## 2016-11-26 ENCOUNTER — Encounter: Payer: Self-pay | Admitting: Podiatry

## 2016-11-26 DIAGNOSIS — M79676 Pain in unspecified toe(s): Secondary | ICD-10-CM

## 2016-11-26 DIAGNOSIS — T25422A Corrosion of unspecified degree of left foot, initial encounter: Secondary | ICD-10-CM

## 2016-11-26 DIAGNOSIS — L97522 Non-pressure chronic ulcer of other part of left foot with fat layer exposed: Secondary | ICD-10-CM | POA: Diagnosis not present

## 2016-11-26 DIAGNOSIS — B351 Tinea unguium: Secondary | ICD-10-CM

## 2016-11-26 DIAGNOSIS — E11621 Type 2 diabetes mellitus with foot ulcer: Secondary | ICD-10-CM | POA: Diagnosis not present

## 2016-11-26 MED ORDER — CEPHALEXIN 500 MG PO CAPS: 500.0000 mg | ORAL_CAPSULE | Freq: Two times a day (BID) | ORAL | 1 refills | Status: DC
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
## 2016-11-26 NOTE — Addendum Note (Signed)
Addended by: Jodene NamWARD, Loan Oguin L on: 11/26/2016 11:26 AM   Modules accepted: Orders

## 2016-11-27 ENCOUNTER — Encounter: Payer: Self-pay | Admitting: Podiatry

## 2016-11-27 ENCOUNTER — Ambulatory Visit (INDEPENDENT_AMBULATORY_CARE_PROVIDER_SITE_OTHER): Payer: Medicare Other | Admitting: Podiatry

## 2016-11-27 ENCOUNTER — Ambulatory Visit (INDEPENDENT_AMBULATORY_CARE_PROVIDER_SITE_OTHER): Payer: Medicare Other

## 2016-11-27 ENCOUNTER — Other Ambulatory Visit: Payer: Self-pay | Admitting: Podiatry

## 2016-11-27 DIAGNOSIS — E11621 Type 2 diabetes mellitus with foot ulcer: Secondary | ICD-10-CM | POA: Diagnosis not present

## 2016-11-27 DIAGNOSIS — A480 Gas gangrene: Secondary | ICD-10-CM

## 2016-11-27 DIAGNOSIS — E0843 Diabetes mellitus due to underlying condition with diabetic autonomic (poly)neuropathy: Secondary | ICD-10-CM

## 2016-11-27 DIAGNOSIS — E1143 Type 2 diabetes mellitus with diabetic autonomic (poly)neuropathy: Secondary | ICD-10-CM

## 2016-11-27 DIAGNOSIS — L97522 Non-pressure chronic ulcer of other part of left foot with fat layer exposed: Secondary | ICD-10-CM | POA: Diagnosis not present

## 2016-11-27 DIAGNOSIS — E08621 Diabetes mellitus due to underlying condition with foot ulcer: Secondary | ICD-10-CM

## 2016-11-27 DIAGNOSIS — I70245 Atherosclerosis of native arteries of left leg with ulceration of other part of foot: Secondary | ICD-10-CM

## 2016-11-27 DIAGNOSIS — L97509 Non-pressure chronic ulcer of other part of unspecified foot with unspecified severity: Secondary | ICD-10-CM | POA: Diagnosis not present
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
## 2016-11-28 ENCOUNTER — Encounter (HOSPITAL_COMMUNITY): Payer: Self-pay | Admitting: *Deleted

## 2016-11-28 ENCOUNTER — Inpatient Hospital Stay (HOSPITAL_COMMUNITY)
Admission: EM | Admit: 2016-11-28 | Discharge: 2016-12-01 | DRG: 264 | Disposition: A | Discharge status: Home / Self Care | Payer: Medicare Other | Attending: Internal Medicine | Admitting: Internal Medicine

## 2016-11-28 ENCOUNTER — Observation Stay (HOSPITAL_COMMUNITY): Payer: Medicare Other

## 2016-11-28 DIAGNOSIS — F1210 Cannabis abuse, uncomplicated: Secondary | ICD-10-CM | POA: Diagnosis present

## 2016-11-28 DIAGNOSIS — L03116 Cellulitis of left lower limb: Secondary | ICD-10-CM | POA: Diagnosis present

## 2016-11-28 DIAGNOSIS — T5491XA Toxic effect of unspecified corrosive substance, accidental (unintentional), initial encounter: Secondary | ICD-10-CM | POA: Diagnosis present

## 2016-11-28 DIAGNOSIS — E11621 Type 2 diabetes mellitus with foot ulcer: Secondary | ICD-10-CM | POA: Diagnosis present

## 2016-11-28 DIAGNOSIS — K219 Gastro-esophageal reflux disease without esophagitis: Secondary | ICD-10-CM | POA: Diagnosis present

## 2016-11-28 DIAGNOSIS — R739 Hyperglycemia, unspecified: Secondary | ICD-10-CM | POA: Diagnosis present

## 2016-11-28 DIAGNOSIS — N179 Acute kidney failure, unspecified: Secondary | ICD-10-CM | POA: Diagnosis not present

## 2016-11-28 DIAGNOSIS — E1169 Type 2 diabetes mellitus with other specified complication: Secondary | ICD-10-CM | POA: Diagnosis present

## 2016-11-28 DIAGNOSIS — K8689 Other specified diseases of pancreas: Secondary | ICD-10-CM | POA: Diagnosis present

## 2016-11-28 DIAGNOSIS — Z794 Long term (current) use of insulin: Secondary | ICD-10-CM

## 2016-11-28 DIAGNOSIS — I10 Essential (primary) hypertension: Secondary | ICD-10-CM | POA: Diagnosis present

## 2016-11-28 DIAGNOSIS — F17210 Nicotine dependence, cigarettes, uncomplicated: Secondary | ICD-10-CM | POA: Diagnosis present

## 2016-11-28 DIAGNOSIS — E118 Type 2 diabetes mellitus with unspecified complications: Secondary | ICD-10-CM | POA: Diagnosis present

## 2016-11-28 DIAGNOSIS — L089 Local infection of the skin and subcutaneous tissue, unspecified: Secondary | ICD-10-CM

## 2016-11-28 DIAGNOSIS — E1065 Type 1 diabetes mellitus with hyperglycemia: Secondary | ICD-10-CM | POA: Diagnosis present

## 2016-11-28 DIAGNOSIS — L97529 Non-pressure chronic ulcer of other part of left foot with unspecified severity: Secondary | ICD-10-CM | POA: Diagnosis present

## 2016-11-28 DIAGNOSIS — E11628 Type 2 diabetes mellitus with other skin complications: Secondary | ICD-10-CM

## 2016-11-28 DIAGNOSIS — K860 Alcohol-induced chronic pancreatitis: Secondary | ICD-10-CM | POA: Diagnosis not present

## 2016-11-28 DIAGNOSIS — E785 Hyperlipidemia, unspecified: Secondary | ICD-10-CM | POA: Diagnosis present

## 2016-11-28 DIAGNOSIS — E1142 Type 2 diabetes mellitus with diabetic polyneuropathy: Secondary | ICD-10-CM | POA: Diagnosis present

## 2016-11-28 DIAGNOSIS — E1165 Type 2 diabetes mellitus with hyperglycemia: Secondary | ICD-10-CM | POA: Diagnosis not present

## 2016-11-28 DIAGNOSIS — L02612 Cutaneous abscess of left foot: Secondary | ICD-10-CM | POA: Diagnosis present

## 2016-11-28 DIAGNOSIS — IMO0002 Reserved for concepts with insufficient information to code with codable children: Secondary | ICD-10-CM | POA: Diagnosis present

## 2016-11-28 DIAGNOSIS — Z79899 Other long term (current) drug therapy: Secondary | ICD-10-CM

## 2016-11-28 DIAGNOSIS — I96 Gangrene, not elsewhere classified: Secondary | ICD-10-CM | POA: Diagnosis not present

## 2016-11-28 DIAGNOSIS — Z23 Encounter for immunization: Secondary | ICD-10-CM | POA: Diagnosis present

## 2016-11-28 DIAGNOSIS — E1152 Type 2 diabetes mellitus with diabetic peripheral angiopathy with gangrene: Secondary | ICD-10-CM | POA: Diagnosis present

## 2016-11-28 DIAGNOSIS — K859 Acute pancreatitis without necrosis or infection, unspecified: Secondary | ICD-10-CM | POA: Diagnosis present

## 2016-11-28 DIAGNOSIS — F1010 Alcohol abuse, uncomplicated: Secondary | ICD-10-CM | POA: Diagnosis present

## 2016-11-28 DIAGNOSIS — T25422A Corrosion of unspecified degree of left foot, initial encounter: Secondary | ICD-10-CM | POA: Diagnosis present

## 2016-11-28 DIAGNOSIS — E108 Type 1 diabetes mellitus with unspecified complications: Secondary | ICD-10-CM

## 2016-11-28 LAB — BASIC METABOLIC PANEL
ANION GAP: 15 (ref 5–15)
BUN: 5 mg/dL — ABNORMAL LOW (ref 6–20)
CO2: 18 mmol/L — AB (ref 22–32)
Calcium: 9.2 mg/dL (ref 8.9–10.3)
Chloride: 99 mmol/L — ABNORMAL LOW (ref 101–111)
Creatinine, Ser: 0.71 mg/dL (ref 0.61–1.24)
GFR calc Af Amer: >60 mL/min (ref >60)
GFR calc non Af Amer: >60 mL/min (ref >60)
GLUCOSE: 352 mg/dL — AB (ref 65–99)
POTASSIUM: 4.3 mmol/L (ref 3.5–5.1)
Sodium: 132 mmol/L — ABNORMAL LOW (ref 135–145)

## 2016-11-28 LAB — RAPID URINE DRUG SCREEN, HOSP PERFORMED
AMPHETAMINES: NOT DETECTED
BENZODIAZEPINES: NOT DETECTED
Barbiturates: NOT DETECTED
COCAINE: NOT DETECTED
OPIATES: NOT DETECTED
Tetrahydrocannabinol: POSITIVE — AB

## 2016-11-28 LAB — CBC
HEMATOCRIT: 43.1 % (ref 39.0–52.0)
HEMOGLOBIN: 14.9 g/dL (ref 13.0–17.0)
MCH: 33.7 pg (ref 26.0–34.0)
MCHC: 34.6 g/dL (ref 30.0–36.0)
MCV: 97.5 fL (ref 78.0–100.0)
Platelets: 275 10*3/uL (ref 150–400)
RBC: 4.42 MIL/uL (ref 4.22–5.81)
RDW: 12.4 % (ref 11.5–15.5)
WBC: 6.7 10*3/uL (ref 4.0–10.5)

## 2016-11-28 LAB — GLUCOSE, CAPILLARY
GLUCOSE-CAPILLARY: 179 mg/dL — AB (ref 65–99)
Glucose-Capillary: 288 mg/dL — ABNORMAL HIGH (ref 65–99)

## 2016-11-28 LAB — CBG MONITORING, ED: Glucose-Capillary: 221 mg/dL — ABNORMAL HIGH (ref 65–99)

## 2016-11-28 LAB — ETHANOL

## 2016-11-28 LAB — TSH: TSH: 3.45 u[IU]/mL (ref 0.350–4.500)

## 2016-11-28 LAB — T4, FREE: Free T4: 0.98 ng/dL (ref 0.61–1.12)

## 2016-11-28 MED ORDER — VITAMIN B-1 100 MG PO TABS
100.0000 mg | ORAL_TABLET | Freq: Every day | ORAL | Status: DC
  Administered 2016-11-28 – 2016-12-01 (×4): 100 mg via ORAL
  Filled 2016-11-28 (×4): qty 1

## 2016-11-28 MED ORDER — TRAZODONE HCL 50 MG PO TABS: 25.0000 mg | ORAL_TABLET | Freq: Every evening | ORAL | Status: DC | PRN

## 2016-11-28 MED ORDER — LORAZEPAM 2 MG/ML IJ SOLN
0.0000 mg | Freq: Two times a day (BID) | INTRAMUSCULAR | Status: DC
  Filled 2016-11-28: qty 1

## 2016-11-28 MED ORDER — POLYETHYLENE GLYCOL 3350 17 G PO PACK: 17.0000 g | PACK | Freq: Every day | ORAL | Status: DC | PRN

## 2016-11-28 MED ORDER — GADOBENATE DIMEGLUMINE 529 MG/ML IV SOLN
15.0000 mL | Freq: Once | INTRAVENOUS | Status: AC | PRN
  Administered 2016-11-28: 15 mL via INTRAVENOUS

## 2016-11-28 MED ORDER — THIAMINE HCL 100 MG/ML IJ SOLN: 100.0000 mg | Freq: Every day | INTRAMUSCULAR | Status: DC

## 2016-11-28 MED ORDER — PROMETHAZINE HCL 25 MG PO TABS: 12.5000 mg | ORAL_TABLET | Freq: Four times a day (QID) | ORAL | Status: DC | PRN

## 2016-11-28 MED ORDER — INSULIN ASPART 100 UNIT/ML ~~LOC~~ SOLN
4.0000 [IU] | Freq: Once | SUBCUTANEOUS | Status: AC
  Administered 2016-11-28: 4 [IU] via SUBCUTANEOUS

## 2016-11-28 MED ORDER — LORAZEPAM 2 MG/ML IJ SOLN: 1.0000 mg | Freq: Four times a day (QID) | INTRAMUSCULAR | Status: AC | PRN

## 2016-11-28 MED ORDER — FOLIC ACID 1 MG PO TABS
1.0000 mg | ORAL_TABLET | Freq: Every day | ORAL | Status: DC
  Administered 2016-11-28 – 2016-12-01 (×4): 1 mg via ORAL
  Filled 2016-11-28 (×4): qty 1

## 2016-11-28 MED ORDER — PIPERACILLIN-TAZOBACTAM 3.375 G IVPB
3.3750 g | Freq: Three times a day (TID) | INTRAVENOUS | Status: DC
  Administered 2016-11-28 – 2016-12-01 (×8): 3.375 g via INTRAVENOUS
  Filled 2016-11-28 (×10): qty 50

## 2016-11-28 MED ORDER — LORAZEPAM 1 MG PO TABS: 1.0000 mg | ORAL_TABLET | Freq: Four times a day (QID) | ORAL | Status: AC | PRN

## 2016-11-28 MED ORDER — HEPARIN SODIUM (PORCINE) 5000 UNIT/ML IJ SOLN
5000.0000 [IU] | Freq: Three times a day (TID) | INTRAMUSCULAR | Status: DC
  Administered 2016-11-28 – 2016-12-01 (×8): 5000 [IU] via SUBCUTANEOUS
  Filled 2016-11-28 (×7): qty 1

## 2016-11-28 MED ORDER — INSULIN ASPART 100 UNIT/ML ~~LOC~~ SOLN
2.0000 [IU] | Freq: Once | SUBCUTANEOUS | Status: AC
  Administered 2016-11-28: 2 [IU] via SUBCUTANEOUS
  Filled 2016-11-28: qty 1

## 2016-11-28 MED ORDER — INSULIN ASPART 100 UNIT/ML ~~LOC~~ SOLN
0.0000 [IU] | Freq: Three times a day (TID) | SUBCUTANEOUS | Status: DC
  Administered 2016-11-28: 3 [IU] via SUBCUTANEOUS
  Administered 2016-11-29: 5 [IU] via SUBCUTANEOUS
  Administered 2016-11-29: 2 [IU] via SUBCUTANEOUS
  Administered 2016-11-30: 5 [IU] via SUBCUTANEOUS
  Administered 2016-12-01: 11 [IU] via SUBCUTANEOUS
  Administered 2016-12-01: 3 [IU] via SUBCUTANEOUS

## 2016-11-28 MED ORDER — VANCOMYCIN HCL 10 G IV SOLR
1500.0000 mg | Freq: Once | INTRAVENOUS | Status: AC
  Administered 2016-11-28: 1500 mg via INTRAVENOUS
  Filled 2016-11-28: qty 1500

## 2016-11-28 MED ORDER — PIPERACILLIN-TAZOBACTAM 3.375 G IVPB 30 MIN
3.3750 g | Freq: Once | INTRAVENOUS | Status: AC
  Administered 2016-11-28: 3.375 g via INTRAVENOUS
  Filled 2016-11-28: qty 50

## 2016-11-28 MED ORDER — INFLUENZA VAC SPLIT QUAD 0.5 ML IM SUSY
0.5000 mL | PREFILLED_SYRINGE | INTRAMUSCULAR | Status: AC
  Administered 2016-11-29: 0.5 mL via INTRAMUSCULAR
  Filled 2016-11-28: qty 0.5

## 2016-11-28 MED ORDER — INSULIN ASPART PROT & ASPART (70-30 MIX) 100 UNIT/ML ~~LOC~~ SUSP
70.0000 [IU] | Freq: Every day | SUBCUTANEOUS | Status: DC
  Administered 2016-11-30: 70 [IU] via SUBCUTANEOUS
  Filled 2016-11-28: qty 10

## 2016-11-28 MED ORDER — PANCRELIPASE (LIP-PROT-AMYL) 12000-38000 UNITS PO CPEP
24000.0000 [IU] | ORAL_CAPSULE | Freq: Three times a day (TID) | ORAL | Status: DC
  Administered 2016-11-28 – 2016-12-01 (×6): 24000 [IU] via ORAL
  Filled 2016-11-28 (×7): qty 2

## 2016-11-28 MED ORDER — VANCOMYCIN HCL 10 G IV SOLR
1500.0000 mg | Freq: Two times a day (BID) | INTRAVENOUS | Status: DC
  Administered 2016-11-28 – 2016-11-30 (×3): 1500 mg via INTRAVENOUS
  Filled 2016-11-28 (×6): qty 1500

## 2016-11-28 MED ORDER — ACETAMINOPHEN 325 MG PO TABS: 650.0000 mg | ORAL_TABLET | Freq: Four times a day (QID) | ORAL | Status: DC | PRN

## 2016-11-28 MED ORDER — ADULT MULTIVITAMIN W/MINERALS CH
1.0000 | ORAL_TABLET | Freq: Every day | ORAL | Status: DC
  Administered 2016-11-28 – 2016-12-01 (×4): 1 via ORAL
  Filled 2016-11-28 (×4): qty 1

## 2016-11-28 MED ORDER — LORAZEPAM 2 MG/ML IJ SOLN
0.0000 mg | Freq: Four times a day (QID) | INTRAMUSCULAR | Status: AC
Start: 2016-11-28 — End: 2016-11-30
  Administered 2016-11-28: 2 mg via INTRAVENOUS
  Filled 2016-11-28 (×2): qty 1

## 2016-11-28 MED ORDER — NICOTINE 14 MG/24HR TD PT24
14.0000 mg | MEDICATED_PATCH | Freq: Every day | TRANSDERMAL | Status: DC
  Administered 2016-11-28 – 2016-12-01 (×4): 14 mg via TRANSDERMAL
  Filled 2016-11-28 (×4): qty 1

## 2016-11-28 MED ORDER — ACETAMINOPHEN 650 MG RE SUPP: 650.0000 mg | Freq: Four times a day (QID) | RECTAL | Status: DC | PRN

## 2016-11-28 MED ORDER — PIPERACILLIN-TAZOBACTAM 3.375 G IVPB 30 MIN: 3.3750 g | Freq: Three times a day (TID) | INTRAVENOUS | Status: DC

## 2016-11-28 MED ORDER — HYDRALAZINE HCL 20 MG/ML IJ SOLN: 10.0000 mg | Freq: Four times a day (QID) | INTRAMUSCULAR | Status: DC | PRN

## 2016-11-28 NOTE — ED Notes (Signed)
[facility] sugar initially 352 on original labs. Now Blood sugar 221. Insulin to be ordered
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
## 2016-11-28 NOTE — ED Notes (Signed)
Report to Sam [id number], patient will go to MRI than floor

## 2016-11-28 NOTE — ED Notes (Signed)
Pt was taken to MRI and will be taken up after MRI is complete

## 2016-11-28 NOTE — Progress Notes (Addendum)
Pharmacy Antibiotic Note  Henri MedalJerel L Lucken is a 46 y.o. male admitted on 11/28/2016 with wound infection, [facility] for debridement and possible amputation of L great toe. Pharmacy has been consulted for vancomycin dosing. Zosyn ordered x 1 dose in the ED per MD. Afebrile, WBC wnl. SCr 0.71 on admit, CrCl>100.  Plan: Zosyn 3.375g IV (30min inf) x1 per MD Vancomycin 1500mg  IV x1; then 1500mg  IV q12h Monitor clinical progress, c/s, renal function, abx plan/LOT Vancomycin trough as indicated F/u need to continue Zosyn?     Temp (24hrs), Avg:98.2 F (36.8 C), Min:98.2 F (36.8 C), Max:98.2 F (36.8 C)  No results for input(s): WBC, CREATININE, LATICACIDVEN, VANCOTROUGH, VANCOPEAK, VANCORANDOM, GENTTROUGH, GENTPEAK, GENTRANDOM, TOBRATROUGH, TOBRAPEAK, TOBRARND, AMIKACINPEAK, AMIKACINTROU, AMIKACIN in the last 168 hours.  Estimated Creatinine Clearance: 103.4 mL/min (by C-G formula based on SCr of 0.91 mg/dL).    No Known Allergies   Babs BertinHaley Yaw Escoto, PharmD, BCPS Clinical Pharmacist 11/28/2016 11:22 AM

## 2016-11-28 NOTE — ED Triage Notes (Signed)
Pt reports being diabetic and having left foot wound. Sent here by foot dr for possible surgery. Denies fever. No acute distress is noted at triage.

## 2016-11-28 NOTE — ED Notes (Signed)
MRI done.  Pt was taken from MRI to room

## 2016-11-29 ENCOUNTER — Inpatient Hospital Stay (HOSPITAL_COMMUNITY): Payer: Medicare Other | Admitting: Anesthesiology

## 2016-11-29 ENCOUNTER — Encounter (HOSPITAL_COMMUNITY)
Admission: EM | Disposition: A | Discharge status: Home / Self Care | Payer: Self-pay | Source: Home / Self Care | Attending: Internal Medicine

## 2016-11-29 ENCOUNTER — Encounter (HOSPITAL_COMMUNITY): Payer: Self-pay | Admitting: Surgery

## 2016-11-29 DIAGNOSIS — E118 Type 2 diabetes mellitus with unspecified complications: Secondary | ICD-10-CM

## 2016-11-29 HISTORY — PX: I&D EXTREMITY: SHX5045

## 2016-11-29 LAB — GLUCOSE, CAPILLARY
GLUCOSE-CAPILLARY: 223 mg/dL — AB (ref 65–99)
GLUCOSE-CAPILLARY: 129 mg/dL — AB (ref 65–99)
GLUCOSE-CAPILLARY: 115 mg/dL — AB (ref 65–99)
GLUCOSE-CAPILLARY: 115 mg/dL — AB (ref 65–99)
GLUCOSE-CAPILLARY: 344 mg/dL — AB (ref 65–99)

## 2016-11-29 LAB — CBC WITH DIFFERENTIAL/PLATELET
BASOS PCT: 0 %
Basophils Absolute: 0 10*3/uL (ref 0.0–0.1)
EOS PCT: 2 %
Eosinophils Absolute: 0.1 10*3/uL (ref 0.0–0.7)
HCT: 40.8 % (ref 39.0–52.0)
Hemoglobin: 13.9 g/dL (ref 13.0–17.0)
LYMPHS PCT: 40 %
Lymphs Abs: 3 10*3/uL (ref 0.7–4.0)
MCH: 33.4 pg (ref 26.0–34.0)
MCHC: 34.1 g/dL (ref 30.0–36.0)
MCV: 98.1 fL (ref 78.0–100.0)
MONO ABS: 0.9 10*3/uL (ref 0.1–1.0)
MONOS PCT: 13 %
NEUTROS ABS: 3.4 10*3/uL (ref 1.7–7.7)
Neutrophils Relative %: 45 %
PLATELETS: 245 10*3/uL (ref 150–400)
RBC: 4.16 MIL/uL — ABNORMAL LOW (ref 4.22–5.81)
RDW: 12.4 % (ref 11.5–15.5)
WBC: 7.4 10*3/uL (ref 4.0–10.5)

## 2016-11-29 LAB — SURGICAL PCR SCREEN
MRSA, PCR: NEGATIVE
Staphylococcus aureus: NEGATIVE

## 2016-11-29 LAB — BASIC METABOLIC PANEL
Anion gap: 10 (ref 5–15)
BUN: 8 mg/dL (ref 6–20)
CALCIUM: 9.3 mg/dL (ref 8.9–10.3)
CO2: 27 mmol/L (ref 22–32)
Chloride: 98 mmol/L — ABNORMAL LOW (ref 101–111)
Creatinine, Ser: 0.91 mg/dL (ref 0.61–1.24)
GFR calc Af Amer: >60 mL/min (ref >60)
GLUCOSE: 129 mg/dL — AB (ref 65–99)
Potassium: 4.3 mmol/L (ref 3.5–5.1)
Sodium: 135 mmol/L (ref 135–145)

## 2016-11-29 LAB — HIV ANTIBODY (ROUTINE TESTING W REFLEX): HIV Screen 4th Generation wRfx: NONREACTIVE

## 2016-11-29 SURGERY — IRRIGATION AND DEBRIDEMENT EXTREMITY
Anesthesia: General | Site: Foot | Laterality: Left

## 2016-11-29 MED ORDER — LIDOCAINE HCL 2 % IJ SOLN
INTRAMUSCULAR | Status: DC | PRN
  Administered 2016-11-29: 10 mL

## 2016-11-29 MED ORDER — BUPIVACAINE HCL (PF) 0.5 % IJ SOLN
INTRAMUSCULAR | Status: DC | PRN
  Administered 2016-11-29: 10 mL

## 2016-11-29 MED ORDER — LIDOCAINE HCL (CARDIAC) 20 MG/ML IV SOLN
INTRAVENOUS | Status: DC | PRN
  Administered 2016-11-29: 80 mg via INTRATRACHEAL

## 2016-11-29 MED ORDER — BUPIVACAINE HCL (PF) 0.5 % IJ SOLN
INTRAMUSCULAR | Status: AC
  Filled 2016-11-29: qty 30

## 2016-11-29 MED ORDER — MIDAZOLAM HCL 2 MG/2ML IJ SOLN
INTRAMUSCULAR | Status: AC
  Filled 2016-11-29: qty 2

## 2016-11-29 MED ORDER — MEPERIDINE HCL 25 MG/ML IJ SOLN: 6.2500 mg | INTRAMUSCULAR | Status: DC | PRN

## 2016-11-29 MED ORDER — ONDANSETRON HCL 4 MG PO TABS: 4.0000 mg | ORAL_TABLET | Freq: Four times a day (QID) | ORAL | Status: DC | PRN

## 2016-11-29 MED ORDER — FENTANYL CITRATE (PF) 100 MCG/2ML IJ SOLN
INTRAMUSCULAR | Status: AC
  Filled 2016-11-29: qty 2

## 2016-11-29 MED ORDER — ONDANSETRON HCL 4 MG/2ML IJ SOLN: 4.0000 mg | Freq: Once | INTRAMUSCULAR | Status: DC | PRN

## 2016-11-29 MED ORDER — 0.9 % SODIUM CHLORIDE (POUR BTL) OPTIME
TOPICAL | Status: DC | PRN
  Administered 2016-11-29: 1000 mL

## 2016-11-29 MED ORDER — HYDROCODONE-ACETAMINOPHEN 7.5-325 MG PO TABS
1.0000 | ORAL_TABLET | Freq: Four times a day (QID) | ORAL | Status: DC
  Administered 2016-11-29 – 2016-12-01 (×8): 1 via ORAL
  Filled 2016-11-29 (×8): qty 1

## 2016-11-29 MED ORDER — FENTANYL CITRATE (PF) 100 MCG/2ML IJ SOLN
INTRAMUSCULAR | Status: DC | PRN
  Administered 2016-11-29: 50 ug via INTRAVENOUS

## 2016-11-29 MED ORDER — PROPOFOL 10 MG/ML IV BOLUS
INTRAVENOUS | Status: DC | PRN
  Administered 2016-11-29: 180 mg via INTRAVENOUS

## 2016-11-29 MED ORDER — ONDANSETRON HCL 4 MG/2ML IJ SOLN: 4.0000 mg | Freq: Four times a day (QID) | INTRAMUSCULAR | Status: DC | PRN

## 2016-11-29 MED ORDER — LACTATED RINGERS IV SOLN
INTRAVENOUS | Status: DC
  Administered 2016-11-29: 13:00:00 via INTRAVENOUS

## 2016-11-29 MED ORDER — ACETAMINOPHEN 325 MG PO TABS: 650.0000 mg | ORAL_TABLET | Freq: Four times a day (QID) | ORAL | Status: DC | PRN

## 2016-11-29 MED ORDER — MIDAZOLAM HCL 5 MG/5ML IJ SOLN
INTRAMUSCULAR | Status: DC | PRN
  Administered 2016-11-29: 2 mg via INTRAVENOUS

## 2016-11-29 MED ORDER — SODIUM CHLORIDE 0.9 % IR SOLN
Status: DC | PRN
  Administered 2016-11-29: 3000 mL

## 2016-11-29 MED ORDER — ACETAMINOPHEN 650 MG RE SUPP: 650.0000 mg | Freq: Four times a day (QID) | RECTAL | Status: DC | PRN

## 2016-11-29 MED ORDER — PROPOFOL 10 MG/ML IV BOLUS
INTRAVENOUS | Status: AC
  Filled 2016-11-29: qty 20

## 2016-11-29 MED ORDER — LIDOCAINE HCL 2 % IJ SOLN
INTRAMUSCULAR | Status: AC
  Filled 2016-11-29: qty 20

## 2016-11-29 MED ORDER — HYDROMORPHONE HCL 1 MG/ML IJ SOLN: 0.2500 mg | INTRAMUSCULAR | Status: DC | PRN

## 2016-11-29 SURGICAL SUPPLY — 53 items
BANDAGE ACE 4X5 VEL STRL LF (GAUZE/BANDAGES/DRESSINGS) IMPLANT
BANDAGE ELASTIC 4 VELCRO ST LF (GAUZE/BANDAGES/DRESSINGS) ×2 IMPLANT
BLADE SAW SGTL 83.5X18.5 (BLADE) ×2 IMPLANT
BLADE SURG 15 STRL LF DISP TIS (BLADE) ×1 IMPLANT
BLADE SURG 15 STRL SS (BLADE) ×2
BNDG CMPR 9X4 STRL LF SNTH (GAUZE/BANDAGES/DRESSINGS) ×2
BNDG COHESIVE 6X5 TAN STRL LF (GAUZE/BANDAGES/DRESSINGS) IMPLANT
BNDG ESMARK 4X9 LF (GAUZE/BANDAGES/DRESSINGS) ×4 IMPLANT
BNDG GAUZE ELAST 4 BULKY (GAUZE/BANDAGES/DRESSINGS) ×2 IMPLANT
BOWL SMART MIX CTS (DISPOSABLE) IMPLANT
CHLORAPREP W/TINT 26ML (MISCELLANEOUS) ×2 IMPLANT
COVER SURGICAL LIGHT HANDLE (MISCELLANEOUS) ×2 IMPLANT
CUFF TOURNIQUET SINGLE 18IN (TOURNIQUET CUFF) ×2 IMPLANT
CUFF TOURNIQUET SINGLE 34IN LL (TOURNIQUET CUFF) ×2 IMPLANT
DRAPE C-ARM MINI 42X72 WSTRAPS (DRAPES) IMPLANT
DRAPE U-SHAPE 47X51 STRL (DRAPES) IMPLANT
DRSG ADAPTIC 3X8 NADH LF (GAUZE/BANDAGES/DRESSINGS) ×2 IMPLANT
DRSG PAD ABDOMINAL 8X10 ST (GAUZE/BANDAGES/DRESSINGS) IMPLANT
ELECT CAUTERY BLADE 6.4 (BLADE) IMPLANT
ELECT REM PT RETURN 9FT ADLT (ELECTROSURGICAL)
ELECTRODE REM PT RTRN 9FT ADLT (ELECTROSURGICAL) IMPLANT
GAUZE PACKING IODOFORM 1/4X15 (GAUZE/BANDAGES/DRESSINGS) ×2 IMPLANT
GAUZE SPONGE 4X4 12PLY STRL (GAUZE/BANDAGES/DRESSINGS) ×2 IMPLANT
GAUZE XEROFORM 1X8 LF (GAUZE/BANDAGES/DRESSINGS) IMPLANT
GLOVE BIO SURGEON STRL SZ8 (GLOVE) ×2 IMPLANT
GLOVE BIOGEL PI IND STRL 8 (GLOVE) ×1 IMPLANT
GLOVE BIOGEL PI INDICATOR 8 (GLOVE) ×1
GOWN STRL REUS W/ TWL LRG LVL3 (GOWN DISPOSABLE) ×2 IMPLANT
GOWN STRL REUS W/TWL LRG LVL3 (GOWN DISPOSABLE) ×4
HANDPIECE INTERPULSE COAX TIP (DISPOSABLE)
KIT BASIN OR (CUSTOM PROCEDURE TRAY) ×2 IMPLANT
KIT ROOM TURNOVER OR (KITS) ×2 IMPLANT
MANIFOLD NEPTUNE II (INSTRUMENTS) ×2 IMPLANT
NEEDLE HYPO 25GX1X1/2 BEV (NEEDLE) ×4 IMPLANT
NS IRRIG 1000ML POUR BTL (IV SOLUTION) ×2 IMPLANT
PACK ORTHO EXTREMITY (CUSTOM PROCEDURE TRAY) ×2 IMPLANT
PAD ARMBOARD 7.5X6 YLW CONV (MISCELLANEOUS) ×4 IMPLANT
PAD CAST 4YDX4 CTTN HI CHSV (CAST SUPPLIES) ×1 IMPLANT
PADDING CAST COTTON 4X4 STRL (CAST SUPPLIES) ×2
PADDING CAST COTTON 6X4 STRL (CAST SUPPLIES) ×2 IMPLANT
SCRUB BETADINE 4OZ XXX (MISCELLANEOUS) ×2 IMPLANT
SET HNDPC FAN SPRY TIP SCT (DISPOSABLE) IMPLANT
SOLUTION BETADINE 4OZ (MISCELLANEOUS) IMPLANT
STAPLER VISISTAT 35W (STAPLE) ×2 IMPLANT
STOCKINETTE IMPERVIOUS 9X36 MD (GAUZE/BANDAGES/DRESSINGS) ×2 IMPLANT
SUT PROLENE 3 0 PS 2 (SUTURE) IMPLANT
SWAB CULTURE LIQ STUART DBL (MISCELLANEOUS) ×2 IMPLANT
SWAB CULTURE LIQUID MINI MALE (MISCELLANEOUS) ×2 IMPLANT
SYR CONTROL 10ML LL (SYRINGE) ×4 IMPLANT
TOWEL OR 17X24 6PK STRL BLUE (TOWEL DISPOSABLE) ×2 IMPLANT
TOWEL OR 17X26 10 PK STRL BLUE (TOWEL DISPOSABLE) ×2 IMPLANT
TUBE CONNECTING 12X1/4 (SUCTIONS) ×2 IMPLANT
YANKAUER SUCT BULB TIP NO VENT (SUCTIONS) ×2 IMPLANT

## 2016-11-29 NOTE — Anesthesia Procedure Notes (Signed)
Procedure Name: LMA Insertion Date/Time: 11/29/2016 3:00 PM Performed by: Marena ChancyBECKNER, Anubis Fundora S Pre-anesthesia Checklist: Patient identified, Emergency Drugs available, Suction available and Patient being monitored Patient Re-evaluated:Patient Re-evaluated prior to inductionOxygen Delivery Method: Circle System Utilized Preoxygenation: Pre-oxygenation with 100% oxygen Intubation Type: IV induction Ventilation: Mask ventilation without difficulty LMA: LMA inserted LMA Size: 5.0 Number of attempts: 1 Airway Equipment and Method: Bite block Placement Confirmation: positive ETCO2 Tube secured with: Tape Dental Injury: Teeth and Oropharynx as per pre-operative assessment

## 2016-11-29 NOTE — Progress Notes (Signed)
Orthopedic Tech Progress Note Patient Details:  Henri MedalJerel L Brazzel [birth year] [id number]  Ortho Devices Type of Ortho Device: Postop shoe/boot Ortho Device/Splint Location: LLE Ortho Device/Splint Interventions: Ordered, Application   Jennye MoccasinHughes, Rilea Arutyunyan Craig 11/29/2016, 5:18 PM

## 2016-11-29 NOTE — Anesthesia Postprocedure Evaluation (Signed)
Anesthesia Post Note  Patient: [name]  Procedure(s) Performed: Procedure(s) (LRB): IRRIGATION AND DEBRIDEMENT EXTREMITY LEFT FOOT (Left)  Patient location during evaluation: PACU Anesthesia Type: General Level of consciousness: awake Pain management: pain level controlled Vital Signs Assessment: post-procedure vital signs reviewed and stable Respiratory status: spontaneous breathing Cardiovascular status: stable Postop Assessment: no signs of nausea or vomiting Anesthetic complications: no       Last Vitals:  Vitals:   11/29/16 0527 11/29/16 1542  BP: 125/80 (!) 123/96  Pulse: 88 87  Resp: 18 17  Temp: 36.7 C 36.1 C    Last Pain:  Vitals:   11/29/16 1542  TempSrc:   PainSc: 0-No pain                 Arma HeadingMukesh Obrian Bulson

## 2016-11-29 NOTE — Progress Notes (Signed)
Inpatient Diabetes Program Recommendations  AACE/ADA: New Consensus Statement on Inpatient Glycemic Control (2015)  Target Ranges:  Prepandial:   less than 140 mg/dL      Peak postprandial:   less than 180 mg/dL (1-2 hours)      Critically ill patients:  140 - 180 mg/dL   Lab Results  Component Value Date   GLUCAP 223 (H) 11/29/2016   HGBA1C 11.2 (H) 11/14/2016    Review of Glycemic Control:  Results for [name], [name] (MRN [id number]) as of 11/29/2016 11:43  Ref. Range 11/28/2016 14:54 11/28/2016 17:38 11/28/2016 21:40 11/29/2016 08:46  Glucose-Capillary Latest Ref Range: 65 - 99 mg/dL 784221 (H) 696179 (H) 295288 (H) 223 (H)   Diabetes history: Type 1 diabetes Outpatient Diabetes medications: 70/30 70 units with breakfast and 60 units with supper Current orders for Inpatient glycemic control:  70/30 70 units with breakfast, Novolog moderate tid with meals  Inpatient Diabetes Program Recommendations:    Note that patient did not receive 70/30 insulin this morning due to NPO.  Please consider increasing frequency of Novolog correction to q 4 hours.  Also may consider d/c of 70/30 while NPO and add Lantus 25 units daily.    Thanks, Beryl MeagerJenny Almer [place]ittleton, RN, BC-ADM Inpatient Diabetes Coordinator Pager [phone] (8a-5p)

## 2016-11-29 NOTE — Anesthesia Preprocedure Evaluation (Addendum)
Anesthesia Evaluation    Airway Mallampati: II  TM Distance: >3 FB Neck ROM: Full    Dental  (+) Teeth Intact   Pulmonary Current Smoker,    breath sounds clear to auscultation       Cardiovascular hypertension,  Rhythm:Regular Rate:Normal     Neuro/Psych PSYCHIATRIC DISORDERS    GI/Hepatic GERD (occasional, no meds per patient)  ,  Endo/Other  diabetes  Renal/GU      Musculoskeletal   Abdominal   Peds  Hematology   Anesthesia Other Findings   Reproductive/Obstetrics                           Anesthesia Physical Anesthesia Plan  ASA: III  Anesthesia Plan: General   Post-op Pain Management:    Induction:   Airway Management Planned: LMA  Additional Equipment:   Intra-op Plan:   Post-operative Plan: Extubation in OR  Informed Consent: I have reviewed the patients History and Physical, chart, labs and discussed the procedure including the risks, benefits and alternatives for the proposed anesthesia with the patient or authorized representative who has indicated his/her understanding and acceptance.   Dental advisory given  Plan Discussed with: CRNA  Anesthesia Plan Comments: (Used marihuana 2 days ago)       Anesthesia Quick Evaluation

## 2016-11-29 NOTE — Transfer of Care (Signed)
Immediate Anesthesia Transfer of Care Note  Patient: [name] MedalJerel L Koble  Procedure(s) Performed: Procedure(s): IRRIGATION AND DEBRIDEMENT EXTREMITY LEFT FOOT (Left)  Patient Location: PACU  Anesthesia Type:General  Level of Consciousness: awake, alert  and oriented  Airway & Oxygen Therapy: Patient Spontanous Breathing and Patient connected to nasal cannula oxygen  Post-op Assessment: Report given to RN and Post -op Vital signs reviewed and stable  Post vital signs: Reviewed and stable  Last Vitals:  Vitals:   11/29/16 0527 11/29/16 1542  BP: 125/80 (!) 123/96  Pulse: 88 87  Resp: 18 17  Temp: 36.7 C 36.1 C    Last Pain:  Vitals:   11/29/16 1542  TempSrc:   PainSc: 0-No pain         Complications: No apparent anesthesia complications
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
## 2016-11-29 NOTE — Consult Note (Signed)
   CONSULTATION NOTE [name]: PODIATRY  [name] [id number] DOB: 03/05/1971 DOA: 11/28/2016  [facility]   Chief Complaint: left foot pain and swelling    Brief History:  46 year old male with a history of diabetes mellitus [facility] of cellulitis and abscess to the left forefoot. Patient was last seen in the office on 11/27/2016. X-rays were taken which were suggestive of possible gas gangrene. A strong malodor noted. Patient was instructed to present to the emergency department and admitted for IV antibiotics and incision and drainage. Patient presents at bedside this morning resting comfortably and denies fever chills nausea or vomiting. Patient admitted on 11/28/2016.    Objective/Physical Exam General: The patient is alert and oriented x3 in no acute distress.  Dermatology: Ulceration noted encompassing the left great toe extending into the interdigital area. There is an extensive amount of slough fibrin and necrotic debris noted to the interdigital ulceration site. There is also an ulcer noted to the plantar aspect of the first MPJ left foot. Malodor noted. Minimal amount of drainage noted.  Vascular: Palpable pedal pulses bilaterally. Minimal edema noted.  Neurological: Epicritic and protective threshold absent bilaterally.   Musculoskeletal Exam: Deferred   Assessment: #1 diabetes mellitus with peripheral neuropathy #2 ulcer left foot secondary to diabetes mellitus #3 abscess left foot with cellulitis   Plan of Care:  #1 Patient was evaluated. #2 this morning I explained to patient that he will require an incision and drainage of abscess and debridement of the ulceration site. This is in an attempt to salvage the foot and prevent amputation. Patient understands the risks and agrees with surgery. All patient questions were answered. No guarantees were expressed or implied. #3  surgery scheduled for this p.m. - surgery will consist of incision and drainage left foot. Debridement of ulcer left foot. #4 continue IV antibiotics per admitting physician. #5 I will continue to follow while inpatient. Patient will follow up in the office post discharge.   Felecia ShellingBrent M. Kla Bily, DPM [facility]  Dr. Felecia ShellingBrent M. Darrnell Mangiaracina, DPM    [address]                                        [place], [place] [address]                Office [phone]  Fax [phone]

## 2016-11-29 NOTE — Brief Op Note (Signed)
11/28/2016 - 11/29/2016  4:53 PM  PATIENT:  Carrolyn Leigh Wenzler  46 y.o. male  PRE-OPERATIVE DIAGNOSIS:  Wound  POST-OPERATIVE DIAGNOSIS:  Wound  PROCEDURE:  Procedure(s): IRRIGATION AND DEBRIDEMENT EXTREMITY LEFT FOOT (Left)  SURGEON:  Surgeon(s) and Role:    * Edrick Kins, DPM - Primary  PHYSICIAN ASSISTANT:   ASSISTANTS: none   ANESTHESIA:   local and MAC  EBL:  Total I/O In: 800 [I.V.:800] Out: -   BLOOD ADMINISTERED:none  DRAINS: none   LOCAL MEDICATIONS USED:  MARCAINE    and LIDOCAINE   SPECIMEN:  Source of Specimen:  Deep wound culture  DISPOSITION OF SPECIMEN:  PATHOLOGY  COUNTS:  YES  TOURNIQUET:   Total Tourniquet Time Documented: Calf (Left) - 20 minutes Total: Calf (Left) - 20 minutes   DICTATION: .Viviann Spare Dictation  PLAN OF CARE: Admit to inpatient   PATIENT DISPOSITION:  PACU - hemodynamically stable.   Delay start of Pharmacological VTE agent (>24hrs) due to surgical blood loss or risk of bleeding: not applicable  Edrick Kins, DPM [facility]  Dr. Edrick Kins, [address]                                        [place], [place] [address]                Office [phone]  Fax [phone]

## 2016-11-30 ENCOUNTER — Encounter (HOSPITAL_COMMUNITY): Payer: Self-pay | Admitting: Podiatry

## 2016-11-30 ENCOUNTER — Inpatient Hospital Stay (HOSPITAL_COMMUNITY): Payer: Medicare Other

## 2016-11-30 LAB — BASIC METABOLIC PANEL
Anion gap: 10 (ref 5–15)
Anion gap: 11 (ref 5–15)
Anion gap: 10 (ref 5–15)
BUN: 10 mg/dL (ref 6–20)
BUN: 11 mg/dL (ref 6–20)
BUN: 11 mg/dL (ref 6–20)
CALCIUM: 8.8 mg/dL — AB (ref 8.9–10.3)
CHLORIDE: 103 mmol/L (ref 101–111)
CO2: 26 mmol/L (ref 22–32)
CO2: 24 mmol/L (ref 22–32)
CO2: 24 mmol/L (ref 22–32)
CREATININE: 2 mg/dL — AB (ref 0.61–1.24)
Calcium: 8.9 mg/dL (ref 8.9–10.3)
Calcium: 9.1 mg/dL (ref 8.9–10.3)
Chloride: 97 mmol/L — ABNORMAL LOW (ref 101–111)
Chloride: 98 mmol/L — ABNORMAL LOW (ref 101–111)
Creatinine, Ser: 2.18 mg/dL — ABNORMAL HIGH (ref 0.61–1.24)
Creatinine, Ser: 2.39 mg/dL — ABNORMAL HIGH (ref 0.61–1.24)
GFR calc Af Amer: 40 mL/min — ABNORMAL LOW (ref >60)
GFR calc Af Amer: 36 mL/min — ABNORMAL LOW (ref >60)
GFR calc non Af Amer: 38 mL/min — ABNORMAL LOW (ref >60)
GFR calc non Af Amer: 35 mL/min — ABNORMAL LOW (ref >60)
GFR calc non Af Amer: 31 mL/min — ABNORMAL LOW (ref >60)
GFR, EST AFRICAN AMERICAN: 44 mL/min — AB (ref >60)
GLUCOSE: 47 mg/dL — AB (ref 65–99)
Glucose, Bld: 298 mg/dL — ABNORMAL HIGH (ref 65–99)
Glucose, Bld: 264 mg/dL — ABNORMAL HIGH (ref 65–99)
POTASSIUM: 3.6 mmol/L (ref 3.5–5.1)
Potassium: 4.3 mmol/L (ref 3.5–5.1)
Potassium: 3.8 mmol/L (ref 3.5–5.1)
SODIUM: 133 mmol/L — AB (ref 135–145)
Sodium: 133 mmol/L — ABNORMAL LOW (ref 135–145)
Sodium: 137 mmol/L (ref 135–145)

## 2016-11-30 LAB — VANCOMYCIN, RANDOM: VANCOMYCIN RM: 39

## 2016-11-30 LAB — GLUCOSE, CAPILLARY
GLUCOSE-CAPILLARY: 239 mg/dL — AB (ref 65–99)
GLUCOSE-CAPILLARY: 79 mg/dL (ref 65–99)
Glucose-Capillary: 36 mg/dL — CL (ref 65–99)
Glucose-Capillary: 51 mg/dL — ABNORMAL LOW (ref 65–99)
Glucose-Capillary: 49 mg/dL — ABNORMAL LOW (ref 65–99)
Glucose-Capillary: 83 mg/dL (ref 65–99)
Glucose-Capillary: 297 mg/dL — ABNORMAL HIGH (ref 65–99)

## 2016-11-30 LAB — CBC
HCT: 39.7 % (ref 39.0–52.0)
Hemoglobin: 13.3 g/dL (ref 13.0–17.0)
MCH: 33.1 pg (ref 26.0–34.0)
MCHC: 33.5 g/dL (ref 30.0–36.0)
MCV: 98.8 fL (ref 78.0–100.0)
PLATELETS: 241 10*3/uL (ref 150–400)
RBC: 4.02 MIL/uL — ABNORMAL LOW (ref 4.22–5.81)
RDW: 12.5 % (ref 11.5–15.5)
WBC: 7.2 10*3/uL (ref 4.0–10.5)

## 2016-11-30 LAB — WOUND CULTURE

## 2016-11-30 MED ORDER — INSULIN ASPART 100 UNIT/ML ~~LOC~~ SOLN
3.0000 [IU] | Freq: Once | SUBCUTANEOUS | Status: AC
  Administered 2016-11-30: 3 [IU] via SUBCUTANEOUS

## 2016-11-30 NOTE — Progress Notes (Signed)
Patient out of bed without post op shoe on.Noticed new bloody drainage on surgical dressing.Instructed patient not to get out of bed without assistance and post op shoe on.Patient fall risk score moderate refuses bed alarm at present time.Will continue to monitor.
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
## 2016-11-30 NOTE — Progress Notes (Signed)
Pharmacy Antibiotic Note  [name] MedalJerel L [name] is a 46 y.o. male admitted on 11/28/2016 with worsening diabetic foot infection. Pharmacy has been consulted to dose Vancomycin + Zosyn.   The patient was noted to have acute kidney injury today with a SCr jump from 0.91 to 2 - with confirmatory labs showing continued rise to 2.18 and now 2.39. The patient's last dose of Vancomycin was at 0152 prior to labs were resulted - subsequent doses were held. A Vancomycin random level resulted as 39 mcg/ml - will continue to hold and will recheck a level the AM to see if it is clearing.   Plan: 1. Hold Vancomycin  2. Continue Zosyn 3.375g IV every 8 hours (infused over 4 hours) 3. Will continue to follow renal function, culture results, LOT, and antibiotic de-escalation plans   Height: 6\' 1"  (185.4 cm) Weight: 158 lb 11.7 oz (72 kg) IBW/kg (Calculated) : 79.9  Temp (24hrs), Avg:97.7 F (36.5 C), Min:97 F (36.1 C), Max:98.4 F (36.9 C)   Recent Labs Lab 11/28/16 1129 11/29/16 0352 11/30/16 0642 11/30/16 0847 11/30/16 1323  WBC 6.7 7.4 7.2  --   --   CREATININE 0.71 0.91 2.00* 2.18* 2.39*  VANCORANDOM  --   --   --   --  39    Estimated Creatinine Clearance: 39.3 mL/min (A) (by C-G formula based on SCr of 2.39 mg/dL (H)).    No Known Allergies  Antimicrobials this admission: Keflex x 24h PTA Vanc 3/14 >> Zosyn 3/14 >>  Dose adjustments this admission:  Microbiology results: 3/15 WCx (intra-op) >> 3/15 MRSA PCR >> neg  Thank you for allowing pharmacy to be a part of this patient's care.  Georgina PillionElizabeth Mustapha Colson, PharmD, BCPS Clinical Pharmacist Pager: [phone] Clinical phone for 11/30/2016 from 7a-3:30p: [phone] If after 3:30p, please call main pharmacy at: [phone] 11/30/2016 4:37 PM

## 2016-11-30 NOTE — Care Management Note (Signed)
Case Management Note  Patient Details  Name: Henri MedalJerel L Stopher MRN: [id number] Date of Birth: 1970-10-20  Subjective/Objective:                  Admitted with Diabetic foot infection with acute gas gangrene of the left foot, cellulitis. Resides with brother, Leotis ShamesJeffery. Pt states independent with ADL's and uses boot for L foot.  Hyacinth MeekerJeffery Williams (Brother)     [phone]      [facility]    Action/Plan: Plan is to d/c to home when medically stable. CM to f/u with disposition needs.  Expected Discharge Date:                  Expected Discharge Plan:  Home/Self Care  In-House Referral:     Discharge planning Services  CM Consult  Post Acute Care Choice:    Choice offered to:     DME Arranged:    DME Agency:     HH Arranged:    HH Agency:     Status of Service:  In process, will continue to follow  If discussed at Long Length of Stay Meetings, dates discussed:    Additional Comments:  Epifanio LeschesCole, Jannatul Wojdyla Hudson, RN 11/30/2016, 1:11 PM

## 2016-11-30 NOTE — Progress Notes (Addendum)
Inpatient Diabetes Program Recommendations  AACE/ADA: New Consensus Statement on Inpatient Glycemic Control (2015)  Target Ranges:  Prepandial:   less than 140 mg/dL      Peak postprandial:   less than 180 mg/dL (1-2 hours)      Critically ill patients:  140 - 180 mg/dL   Lab Results  Component Value Date   GLUCAP 36 (LL) 11/30/2016   HGBA1C 11.2 (H) 11/14/2016    Review of Glycemic Control  Diabetes history: DM, pancreatitis, followed by Dr Fransico HimNida (Endocrinologist) last seen 11/19/2016 Outpatient Diabetes medications: Novolog 70/30 70 units in am and 60 units in pm Current orders for Inpatient glycemic control: moderate correction scale Novolog 0-15 units TIDAC, Novolog 70/30 70 units daily   Inpatient Diabetes Program Recommendations:   Insulin - Basal: Please consider Novolog 70/30 18 units BID (will provide a total of 25 units basal and 11 units meal coverage per day) as noted CBG of 36 at 1143 today (text page to MD sent).  Thank you,  Kristine LineaKaren Nahjae Hoeg, RN, MSN Diabetes Coordinator Inpatient Diabetes Program [phone] (Team Pager)

## 2016-12-01 LAB — GLUCOSE, CAPILLARY
GLUCOSE-CAPILLARY: 344 mg/dL — AB (ref 65–99)
Glucose-Capillary: 178 mg/dL — ABNORMAL HIGH (ref 65–99)

## 2016-12-01 LAB — CBC
HCT: 39.1 % (ref 39.0–52.0)
HEMOGLOBIN: 13.2 g/dL (ref 13.0–17.0)
MCH: 33.8 pg (ref 26.0–34.0)
MCHC: 33.8 g/dL (ref 30.0–36.0)
MCV: 100 fL (ref 78.0–100.0)
Platelets: 247 10*3/uL (ref 150–400)
RBC: 3.91 MIL/uL — AB (ref 4.22–5.81)
RDW: 12.7 % (ref 11.5–15.5)
WBC: 7.4 10*3/uL (ref 4.0–10.5)

## 2016-12-01 LAB — BASIC METABOLIC PANEL
ANION GAP: 7 (ref 5–15)
BUN: 14 mg/dL (ref 6–20)
CALCIUM: 8.8 mg/dL — AB (ref 8.9–10.3)
CO2: 26 mmol/L (ref 22–32)
Chloride: 103 mmol/L (ref 101–111)
Creatinine, Ser: 2.93 mg/dL — ABNORMAL HIGH (ref 0.61–1.24)
GFR calc non Af Amer: 24 mL/min — ABNORMAL LOW (ref >60)
GFR, EST AFRICAN AMERICAN: 28 mL/min — AB (ref >60)
Glucose, Bld: 233 mg/dL — ABNORMAL HIGH (ref 65–99)
Potassium: 4.4 mmol/L (ref 3.5–5.1)
Sodium: 136 mmol/L (ref 135–145)

## 2016-12-01 LAB — VANCOMYCIN, RANDOM: Vancomycin Rm: 27

## 2016-12-01 MED ORDER — INSULIN ASPART PROT & ASPART (70-30 MIX) 100 UNIT/ML PEN: 18.0000 [IU] | PEN_INJECTOR | Freq: Two times a day (BID) | SUBCUTANEOUS | 0 refills | Status: DC

## 2016-12-01 MED ORDER — INSULIN ASPART PROT & ASPART (70-30 MIX) 100 UNIT/ML ~~LOC~~ SUSP: 18.0000 [IU] | Freq: Every day | SUBCUTANEOUS | Status: DC

## 2016-12-01 MED ORDER — DOXYCYCLINE HYCLATE 100 MG PO TABS
100.0000 mg | ORAL_TABLET | Freq: Two times a day (BID) | ORAL | Status: DC
  Administered 2016-12-01: 100 mg via ORAL
  Filled 2016-12-01: qty 1

## 2016-12-01 MED ORDER — INSULIN ASPART PROT & ASPART (70-30 MIX) 100 UNIT/ML ~~LOC~~ SUSP
18.0000 [IU] | Freq: Two times a day (BID) | SUBCUTANEOUS | Status: DC
  Filled 2016-12-01: qty 10

## 2016-12-01 MED ORDER — SULFAMETHOXAZOLE-TRIMETHOPRIM 800-160 MG PO TABS: 1.0000 | ORAL_TABLET | Freq: Two times a day (BID) | ORAL | Status: DC

## 2016-12-01 MED ORDER — TRAZODONE HCL 50 MG PO TABS: 25.0000 mg | ORAL_TABLET | Freq: Every evening | ORAL | 0 refills | Status: DC | PRN

## 2016-12-01 MED ORDER — HYDROCODONE-ACETAMINOPHEN 7.5-325 MG PO TABS: 1.0000 | ORAL_TABLET | Freq: Four times a day (QID) | ORAL | 0 refills | Status: DC

## 2016-12-01 MED ORDER — DOXYCYCLINE HYCLATE 100 MG PO TABS: 100.0000 mg | ORAL_TABLET | Freq: Two times a day (BID) | ORAL | 0 refills | Status: DC

## 2016-12-01 NOTE — Discharge Instructions (Signed)
Acute Kidney Injury, Adult Acute kidney injury is a sudden worsening of kidney function. The kidneys are organs that have several jobs. They filter the blood to remove waste products and extra fluid. They also maintain a healthy balance of minerals and hormones in the body, which helps control blood pressure and keep bones strong. With this condition, your kidneys do not do their jobs as well as they should. This condition ranges from mild to severe. Over time it may develop into long-lasting (chronic) kidney disease. Early detection and treatment may prevent acute kidney injury from developing into a chronic condition. What are the causes? Common causes of this condition include:  A problem with blood flow to the kidneys. This may be caused by:  Low blood pressure (hypotension) or shock.  Blood loss.  Heart and blood vessel (cardiovascular) disease.  Severe burns.  Liver disease.  Direct damage to the kidneys. This may be caused by:  Certain medicines.  A kidney infection.  Poisoning.  Being around or in contact with toxic substances.  A surgical wound.  A hard, direct hit to the kidney area.  A sudden blockage of urine flow. This may be caused by:  Cancer.  Kidney stones.  An enlarged prostate in males. What are the signs or symptoms? Symptoms of this condition may not be obvious until the condition becomes severe. Symptoms of this condition can include:  Tiredness (lethargy), or difficulty staying awake.  Nausea or vomiting.  Swelling (edema) of the face, legs, ankles, or feet.  Problems with urination, such as:  Abdominal pain, or pain along the side of your stomach (flank).  Decreased urine production.  Decrease in the force of urine flow.  Muscle twitches and cramps, especially in the legs.  Confusion or trouble concentrating.  Loss of appetite.  Fever. How is this diagnosed? This condition may be diagnosed with tests, including:  Blood  tests.  Urine tests.  Imaging tests.  A test in which a sample of tissue is removed from the kidneys to be examined under a microscope (kidney biopsy). How is this treated? Treatment for this condition depends on the cause and how severe the condition is. In mild cases, treatment may not be needed. The kidneys may heal on their own. In more severe cases, treatment will involve:  Treating the cause of the kidney injury. This may involve changing any medicines you are taking or adjusting your dosage.  Fluids. You may need specialized IV fluids to balance your body's needs.  Having a catheter placed to drain urine and prevent blockages.  Preventing problems from occurring. This may mean avoiding certain medicines or procedures that can cause further injury to the kidneys. In some cases treatment may also require:  A procedure to remove toxic wastes from the body (dialysis or continuous renal replacement therapy - CRRT).  Surgery. This may be done to repair a torn kidney, or to remove the blockage from the urinary system. Follow these instructions at home: Medicines   Take over-the-counter and prescription medicines only as told by your health care provider.  Do not take any new medicines without your health care provider's approval. Many medicines can worsen your kidney damage.  Do not take any vitamin and mineral supplements without your health care provider's approval. Many nutritional supplements can worsen your kidney damage. Lifestyle   If your health care provider prescribed changes to your diet, follow them. You may need to decrease the amount of protein you eat.  Achieve and maintain a   healthy weight. If you need help with this, ask your health care provider.  Start or continue an exercise plan. Try to exercise at least 30 minutes a day, 5 days a week.  Do not use any tobacco products, such as cigarettes, chewing tobacco, and e-cigarettes. If you need help quitting, ask  your health care provider. General instructions   Keep track of your blood pressure. Report changes in your blood pressure as told by your health care provider.  Stay up to date with immunizations. Ask your health care provider which immunizations you need.  Keep all follow-up visits as told by your health care provider. This is important. Where to find more information:  American Association of Kidney Patients: www.aakp.org  National Kidney Foundation: www.kidney.org  American Kidney Fund: www.akfinc.org  Life Options Rehabilitation Program:  www.lifeoptions.org  www.kidneyschool.org Contact a health care provider if:  Your symptoms get worse.  You develop new symptoms. Get help right away if:  You develop symptoms of worsening kidney disease, which include:  Headaches.  Abnormally dark or light skin.  Easy bruising.  Frequent hiccups.  Chest pain.  Shortness of breath.  End of menstruation in women.  Seizures.  Confusion or altered mental status.  Abdominal or back pain.  Itchiness.  You have a fever.  Your body is producing less urine.  You have pain or bleeding when you urinate. Summary  Acute kidney injury is a sudden worsening of kidney function.  Acute kidney injury can be caused by problems with blood flow to the kidneys, direct damage to the kidneys, and sudden blockage of urine flow.  Symptoms of this condition may not be obvious until it becomes severe. Symptoms may include edema, lethargy, confusion, nausea or vomiting, and problems passing urine.  This condition can usually be diagnosed with blood tests, urine tests, and imaging tests. Sometimes a kidney biopsy is done to diagnose this condition.  Treatment for this condition often involves treating the underlying cause. It is treated with fluids, medicines, dialysis, diet changes, or surgery. This information is not intended to replace advice given to you by your health care provider.  Make sure you discuss any questions you have with your health care provider. Document Released: 03/19/2011 Document Revised: 08/24/2016 Document Reviewed: 08/24/2016 Elsevier Interactive Patient Education  2017 Elsevier Inc.  
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
## 2016-12-01 NOTE — Care Management Note (Signed)
Case Management Note  Patient Details  Name: [name] MRN: [id number] Date of Birth: 11-02-1970  Subjective/Objective:                    Action/Plan:  [facility]. Patient with boot to foot, declines need for further ambulatory DME.   Expected Discharge Date:                  Expected Discharge Plan:  Home/Self Care  In-House Referral:     Discharge planning Services  CM Consult  Post Acute Care Choice:    Choice offered to:     DME Arranged:    DME Agency:     HH Arranged:  RN HH Agency:  [facility]  Status of Service:  Completed, signed off  If discussed at Long Length of Stay Meetings, dates discussed:    Additional Comments:  Lawerance SabalDebbie Tyson Masin, RN 12/01/2016, 10:05 AM

## 2016-12-01 NOTE — Progress Notes (Signed)
[name] to be D/C'd to home with home health nurse for dressing changes. Has been arranged by case management, and set up to come to patient's home tomorrow.  per MD order.  Discussed with the patient and all questions fully answered.  VSS, Skin clean, dry and intact without evidence of skin break down, no evidence of skin tears noted. IV catheter discontinued intact. Site without signs and symptoms of complications. Dressing and pressure applied.  An After Visit Summary was printed and given to the patient. Patient received prescriptions.  D/c education completed with patient/family including follow up instructions, medication list, d/c activities limitations if indicated, with other d/c instructions as indicated by MD - patient able to verbalize understanding, all questions fully answered.   Patient instructed to return to ED, call 911, or call MD for any changes in condition.   Patient escorted via WC, and D/C home via private auto.  [name] 12/01/2016 4:51 PM

## 2016-12-04 LAB — AEROBIC/ANAEROBIC CULTURE W GRAM STAIN (SURGICAL/DEEP WOUND)

## 2016-12-04 LAB — AEROBIC/ANAEROBIC CULTURE (SURGICAL/DEEP WOUND)

## 2016-12-11 ENCOUNTER — Encounter: Payer: Self-pay | Admitting: Podiatry

## 2016-12-11 ENCOUNTER — Ambulatory Visit (INDEPENDENT_AMBULATORY_CARE_PROVIDER_SITE_OTHER): Payer: Medicare Other | Admitting: Podiatry

## 2016-12-11 VITALS — BP 140/98 | HR 90 | Temp 97.1°F

## 2016-12-11 DIAGNOSIS — E08621 Diabetes mellitus due to underlying condition with foot ulcer: Secondary | ICD-10-CM | POA: Diagnosis not present

## 2016-12-11 DIAGNOSIS — L97509 Non-pressure chronic ulcer of other part of unspecified foot with unspecified severity: Secondary | ICD-10-CM | POA: Diagnosis not present

## 2016-12-11 DIAGNOSIS — I70245 Atherosclerosis of native arteries of left leg with ulceration of other part of foot: Secondary | ICD-10-CM | POA: Diagnosis not present

## 2016-12-11 DIAGNOSIS — L97523 Non-pressure chronic ulcer of other part of left foot with necrosis of muscle: Secondary | ICD-10-CM | POA: Diagnosis not present

## 2016-12-11 MED ORDER — DOXYCYCLINE HYCLATE 100 MG PO TABS: 100.0000 mg | ORAL_TABLET | Freq: Two times a day (BID) | ORAL | 0 refills | Status: DC

## 2016-12-11 MED ORDER — HYDROCODONE-ACETAMINOPHEN 7.5-325 MG PO TABS: 1.0000 | ORAL_TABLET | Freq: Four times a day (QID) | ORAL | 0 refills | Status: DC

## 2016-12-14 NOTE — Progress Notes (Signed)
   Subjective:  Patient presents today for follow-up evaluation of incision and drainage to the left foot. Patient states that he is doing well however he still experiences significant pain at nighttime. Patient denies fever, chills, nausea and vomiting.   Objective/Physical Exam General: The patient is alert and oriented x3 in no acute distress.  Dermatology: Wound #1 noted to the left foot between the first and second digits measuring approximately 001.001.001.001 cm (LxWxD).  To the noted ulceration there is no eschar. There is a moderate amount of slough fibrin and necrotic tissue noted. Granulation tissue and wound base is red. There is a minimal amount of serosanguineous drainage noted. There is no exposed bone muscle-tendon ligament or joint. There is no malodor.  Vascular: Palpable pedal pulses bilaterally. No edema or erythema noted.   Neurological: Epicritic and protective threshold absent  Musculoskeletal Exam: Range of motion within normal limits to all pedal and ankle joints bilateral. Muscle strength 5/5 in all groups bilateral.  Assessment: #1 status post incision and drainage left foot. Date of surgery 11/29/2016. #2 ulcer left foot secondary to diabetes mellitus   Plan of Care:  #1 Patient was evaluated. X-rays were reviewed #2 medically necessary excisional debridement including muscle and deep fascial tissues performed using a tissue nipper. Excisional debridement of necrotic nonviable tissue down to healthy bleeding viable tissue was performed with post-debridement measurements same as pre-. Betadine soaked Dry sterile dressing applied. #3 sterile gauze packing was placed into the open incision site between digits one-to the left foot. #4 prescription for Vicodin #5 prescription for doxycycline 100 mg #6 recommend daily dressing changes with Betadine  #7 return to clinic in 2 weeks   Felecia Shelling, DPM [facility]  Dr. Felecia Shelling, DPM    [address]                                        [place], [place] [address]                Office [phone]  Fax [phone]

## 2017-01-01 ENCOUNTER — Other Ambulatory Visit: Payer: Self-pay | Admitting: "Endocrinology

## 2017-01-01 ENCOUNTER — Encounter: Payer: Self-pay | Admitting: Podiatry

## 2017-01-01 ENCOUNTER — Ambulatory Visit (INDEPENDENT_AMBULATORY_CARE_PROVIDER_SITE_OTHER): Payer: Medicare Other | Admitting: Podiatry

## 2017-01-01 DIAGNOSIS — L97523 Non-pressure chronic ulcer of other part of left foot with necrosis of muscle: Secondary | ICD-10-CM

## 2017-01-01 DIAGNOSIS — I70245 Atherosclerosis of native arteries of left leg with ulceration of other part of foot: Secondary | ICD-10-CM

## 2017-01-01 MED ORDER — HYDROCODONE-ACETAMINOPHEN 7.5-325 MG PO TABS: 1.0000 | ORAL_TABLET | Freq: Four times a day (QID) | ORAL | 0 refills | Status: DC

## 2017-01-01 MED ORDER — AMOXICILLIN-POT CLAVULANATE 875-125 MG PO TABS: 1.0000 | ORAL_TABLET | Freq: Two times a day (BID) | ORAL | 0 refills | Status: DC

## 2017-01-01 NOTE — Progress Notes (Signed)
   Subjective:  Patient presents today for follow-up evaluation of incision and drainage to the left foot. Patient states he still has pain, worse at night. He reports the wounds are slowly healing.   Objective/Physical Exam General: The patient is alert and oriented x3 in no acute distress.  Dermatology: Wound #1 noted to the left foot first interdigital space measuring approximately 3.0 0.5 0.2 cm (LxWxD).  Wound #2 noted to the sub-first MPJ of the left foot measuring approximately 1.5 x 1.5 x 2.5 cm. To the noted ulcerations there is no eschar. There is a moderate amount of slough fibrin and necrotic tissue noted. Granulation tissue and wound base is red. There is a minimal amount of serosanguineous drainage noted. There is no exposed bone muscle-tendon ligament or joint. There is no malodor.  Vascular: Palpable pedal pulses bilaterally. No edema or erythema noted.   Neurological: Epicritic and protective threshold absent  Musculoskeletal Exam: Range of motion within normal limits to all pedal and ankle joints bilateral. Muscle strength 5/5 in all groups bilateral.  Assessment: #1 status post incision and drainage left foot. Date of surgery 11/29/2016. #2 ulcer left foot secondary to diabetes mellitus   Plan of Care:  #1 Patient was evaluated.  #2 medically necessary excisional debridement including muscle and deep fascial tissues performed using a tissue nipper. Excisional debridement of necrotic nonviable tissue down to healthy bleeding viable tissue was performed with post-debridement measurements same as pre-.  #3 dry sterile dressing applied #4 prescription for Norco 7.5/325 mg #30 #5 prescription for Augmentin based on previous cultures #6 return to clinic in 3 weeks  Felecia Shelling, DPM [facility]  Dr. Felecia Shelling, DPM    [address]                                        [place], [place] [address]                Office [phone]  Fax [phone]

## 2017-01-07 ENCOUNTER — Telehealth: Payer: Self-pay | Admitting: *Deleted

## 2017-01-07 NOTE — Telephone Encounter (Addendum)
Edson Snowball - [facility] called for new wound care orders. Orders called to [facility] she states understanding and will put in as a verbal order.02/04/2017-Shannon - [facility] states pt's foot wound has worsened is extremely macerated and has swelling to foot and ankle. I told pt I would inform Dr. Logan Bores, but I felt he would benefit from being seen tomorrow by one of our doctors, and I would have a scheduler contact him tomorrow morning.02/11/2017-Shannon - [facility] states left 1st toe is swollen and the area between is split again and pt has an appt 02/12/2017, and wanted Dr. Logan Bores to know.

## 2017-01-08 NOTE — Telephone Encounter (Signed)
Dressing changes 3x/week x 4 weeks.  - Cleansed with normal saline. Dry. - paint periwound with Betadine - 1/4" plain gauze packing - dress with DSD.   Thanks, Dr. Logan Bores

## 2017-01-22 ENCOUNTER — Ambulatory Visit (INDEPENDENT_AMBULATORY_CARE_PROVIDER_SITE_OTHER): Payer: Medicare Other | Admitting: Podiatry

## 2017-01-22 ENCOUNTER — Encounter: Payer: Self-pay | Admitting: Podiatry

## 2017-01-22 ENCOUNTER — Other Ambulatory Visit: Payer: Self-pay | Admitting: "Endocrinology

## 2017-01-22 DIAGNOSIS — L97523 Non-pressure chronic ulcer of other part of left foot with necrosis of muscle: Secondary | ICD-10-CM | POA: Diagnosis not present

## 2017-01-22 DIAGNOSIS — I70245 Atherosclerosis of native arteries of left leg with ulceration of other part of foot: Secondary | ICD-10-CM | POA: Diagnosis not present

## 2017-01-22 MED ORDER — HYDROCODONE-ACETAMINOPHEN 7.5-325 MG PO TABS: 1.0000 | ORAL_TABLET | Freq: Four times a day (QID) | ORAL | 0 refills | Status: DC

## 2017-01-23 NOTE — Progress Notes (Signed)
   Subjective:  Patient with a history of diabetes mellitus presents today for follow up evaluation of an ulceration to the left forefoot. He states he is doing better but is still experiencing some pain on the plantar aspect of the left foot and of the left great toe. Patient presents today for further treatment and evaluation   Objective/Physical Exam General: The patient is alert and oriented x3 in no acute distress.  Dermatology:  Wound #1 noted to the left forefoot 1.5 x 1.5 x 3.0 cm (LxWxD).   To the noted ulceration(s), there is no eschar. There is a moderate amount of slough, fibrin, and necrotic tissue noted. Granulation tissue and wound base is red. There is a minimal amount of serosanguineous drainage noted. There is no exposed bone muscle-tendon ligament or joint. There is no malodor. Periwound integrity is intact. Skin is warm, dry and supple bilateral lower extremities.  Vascular: Palpable pedal pulses bilaterally. No edema or erythema noted. Capillary refill within normal limits.  Neurological: Epicritic and protective threshold absent bilaterally.   Musculoskeletal Exam: Range of motion within normal limits to all pedal and ankle joints bilateral. Muscle strength 5/5 in all groups bilateral.   Assessment: #1 Left forefoot ulcer secondary to diabetes mellitus #2 diabetes mellitus w/ peripheral neuropathy   Plan of Care:  #1 Patient was evaluated. #2 medically necessary excisional debridement including muscle and deep fascial tissue was performed using a tissue nipper and a chisel blade. Excisional debridement of all the necrotic nonviable tissue down to healthy bleeding viable tissue was performed with post-debridement measurements same as pre-. #3 the wound was cleansed and dry sterile dressing applied. #4 Postop shoe given #5 Modify home health dressing changes to collagen packing #6 Refill prescription for Norco 7.5/325 mg #7 patient is to return to clinic in 3  weeks.   Felecia ShellingBrent M. Evans, DPM [facility]  Dr. Felecia ShellingBrent M. Evans, DPM    [address]                                        [place], [place] [address]                Office [phone]  Fax [phone]

## 2017-01-29 ENCOUNTER — Telehealth: Payer: Self-pay | Admitting: Podiatry

## 2017-01-29 NOTE — Telephone Encounter (Signed)
[name] with [facility] called wanting to clarify wound care orders and also get verbal orders to re-cert him for continued wound care. You can reach [place] at [phone].

## 2017-01-29 NOTE — Telephone Encounter (Signed)
I reviewed clinicals of 01/22/2017 and Dr. Logan BoresEvans ordered home health care to be collagen packing. I informed Carollee HerterShannon and she states she needs reinstatement for home health care, she goes once a week and pt changes the dressing the other days, I told her to continue as previously with the schedule.

## 2017-02-04 NOTE — Telephone Encounter (Signed)
Patient may need to go to ED if symptoms worsen. Dr. Logan BoresEvans

## 2017-02-05 ENCOUNTER — Ambulatory Visit (INDEPENDENT_AMBULATORY_CARE_PROVIDER_SITE_OTHER): Payer: Medicare Other | Admitting: Podiatry

## 2017-02-05 ENCOUNTER — Encounter: Payer: Self-pay | Admitting: Podiatry

## 2017-02-05 DIAGNOSIS — I70245 Atherosclerosis of native arteries of left leg with ulceration of other part of foot: Secondary | ICD-10-CM

## 2017-02-05 DIAGNOSIS — L97523 Non-pressure chronic ulcer of other part of left foot with necrosis of muscle: Secondary | ICD-10-CM | POA: Diagnosis not present

## 2017-02-05 DIAGNOSIS — M86172 Other acute osteomyelitis, left ankle and foot: Secondary | ICD-10-CM

## 2017-02-05 DIAGNOSIS — E0842 Diabetes mellitus due to underlying condition with diabetic polyneuropathy: Secondary | ICD-10-CM

## 2017-02-05 MED ORDER — SULFAMETHOXAZOLE-TRIMETHOPRIM 400-80 MG PO TABS: 1.0000 | ORAL_TABLET | Freq: Two times a day (BID) | ORAL | 0 refills | Status: DC

## 2017-02-05 MED ORDER — HYDROCODONE-ACETAMINOPHEN 7.5-325 MG PO TABS: 1.0000 | ORAL_TABLET | Freq: Four times a day (QID) | ORAL | 0 refills | Status: DC

## 2017-02-05 NOTE — Progress Notes (Signed)
   Subjective:  Patient with a history of diabetes mellitus presents today for follow up evaluation of an ulceration to the left forefoot. He states his symptoms have worsened. He reports yellowish/brownish drainage that is malodorous.   Objective/Physical Exam General: The patient is alert and oriented x3 in no acute distress.  Dermatology:  Wound #1 noted to the sub-first MPJ of the left foot 2.0 x 2.0 x 1.0 cm (LxWxD).   To the noted ulceration(s), there is no eschar. There is a moderate amount of slough, fibrin, and necrotic tissue noted. Granulation tissue and wound base is red. There is a minimal amount of serosanguineous drainage noted. There is no exposed bone muscle-tendon ligament or joint. There is no malodor. Periwound integrity is intact. Skin is warm, dry and supple bilateral lower extremities.  Vascular: Erythema and edema noted to the left foot. Palpable pedal pulses bilaterally. Capillary refill within normal limits.  Neurological: Epicritic and protective threshold absent bilaterally.   Musculoskeletal Exam: Range of motion within normal limits to all pedal and ankle joints bilateral. Muscle strength 5/5 in all groups bilateral.   Assessment: #1 ulcer to the sub-first MPJ of the left foot secondary to diabetes mellitus #2 diabetes mellitus w/ peripheral neuropathy #3 cellulitis of the left foot   Plan of Care:  #1 Patient was evaluated. #2 medically necessary excisional debridement including muscle and deep fascial tissue was performed using a tissue nipper and a chisel blade. Excisional debridement of all the necrotic nonviable tissue down to healthy bleeding viable tissue was performed with post-debridement measurements same as pre-. #3 the wound was cleansed and dry sterile dressing applied. #4 culture was taken today. #5 prescription for Bactrim DS # 28 given to patient. #6 order for MRI of the left foot. #7 return to clinic in 1 week.   Felecia ShellingBrent M. Evans,  DPM [facility]  Dr. Felecia ShellingBrent M. Evans, DPM    [address]                                        [place], [place] [address]                Office [phone]  Fax [phone]

## 2017-02-06 ENCOUNTER — Ambulatory Visit
Admission: RE | Admit: 2017-02-06 | Discharge: 2017-02-06 | Disposition: A | Discharge status: Home / Self Care | Payer: Medicare Other | Source: Ambulatory Visit | Attending: Podiatry | Admitting: Podiatry

## 2017-02-06 DIAGNOSIS — M65872 Other synovitis and tenosynovitis, left ankle and foot: Secondary | ICD-10-CM | POA: Insufficient documentation

## 2017-02-06 DIAGNOSIS — L97529 Non-pressure chronic ulcer of other part of left foot with unspecified severity: Secondary | ICD-10-CM | POA: Diagnosis not present

## 2017-02-06 DIAGNOSIS — M86172 Other acute osteomyelitis, left ankle and foot: Secondary | ICD-10-CM | POA: Diagnosis present

## 2017-02-06 DIAGNOSIS — L97521 Non-pressure chronic ulcer of other part of left foot limited to breakdown of skin: Secondary | ICD-10-CM | POA: Insufficient documentation

## 2017-02-08 LAB — WOUND CULTURE

## 2017-02-12 ENCOUNTER — Encounter: Payer: Self-pay | Admitting: Podiatry

## 2017-02-12 ENCOUNTER — Ambulatory Visit (INDEPENDENT_AMBULATORY_CARE_PROVIDER_SITE_OTHER): Payer: Medicare Other | Admitting: Podiatry

## 2017-02-12 DIAGNOSIS — L97524 Non-pressure chronic ulcer of other part of left foot with necrosis of bone: Secondary | ICD-10-CM

## 2017-02-12 DIAGNOSIS — M86172 Other acute osteomyelitis, left ankle and foot: Secondary | ICD-10-CM | POA: Diagnosis not present

## 2017-02-13 NOTE — Progress Notes (Signed)
   Subjective:  Patient with a history of diabetes mellitus presents today for follow up evaluation of an ulceration to the left forefoot. He states his symptoms have worsened. He reports yellowish/brownish drainage that is malodorous.   Objective/Physical Exam General: The patient is alert and oriented x3 in no acute distress.  Dermatology:  Multiple ulcerations with macerations and fissures noted localized to the medial aspect of the left forefoot. There is an extensive amount of drainage noted. There is exposed bone to the plantar aspect of the ulcer. The plantar ulcer sub-first MPJ measures approximately 2.02.03.5 cm. There is exposed bone. There is malodor. There is a moderate amount of serous drainage noted. Periwound integrity is macerated.  Maceration also noted with fissuring and ulcer to the first webspace left foot.  Vascular: Erythema and edema noted to the left foot. Palpable pedal pulses bilaterally. Capillary refill within normal limits.  Neurological: Epicritic and protective threshold absent bilaterally.   Musculoskeletal Exam: Range of motion within normal limits to all pedal and ankle joints bilateral. Muscle strength 5/5 in all groups bilateral.   MRI IMPRESSION:  1. Nonhealing ulcer along the plantar aspect of the first MTP joint extending to the plantar aspect of the first metatarsal head. Surrounding soft tissue edema concerning for cellulitis which extends into the great toe. Cortical irregularity along the plantar lateral aspect of the first metatarsal head with associated marrow edema and T1 hypointensity most concerning for osteomyelitis. Small first MTP joint effusion which reflect reactive edema versus septic arthritis. Marrow edema in the first proximal and to lesser extent distal phalanx without bone destruction or periosteal reaction likely reactive. 2. Fluid in the flexor hallucis longus tendon sheath with a few low signal foci within the tendon sheath likely  reflecting air, most concerning for infectious tenosynovitis.  Assessment: #1 ulcer to the sub-first MPJ of the left foot secondary to diabetes mellitus #2 diabetes mellitus w/ peripheral neuropathy #3 cellulitis of the left foot with obvious acute osteomyelitis first metatarsal   Plan of Care:  .1 Patient was evaluated. Today MRI results were reviewed. 2. patient understands that his ulcer is deteriorating rapidly. Recommend going to the emergency department for IV antibiotics and partial first ray amputation with incision and drainage left foot 3. Continue oral antibiotics, Bactrim DS 4. Once the patient is admitted I can perform partial first ray amputation with incision and drainage. 5. Return to clinic 1 week postop  Felecia ShellingBrent M. Nisaiah Bechtol, DPM [facility]  Dr. Felecia ShellingBrent M. Lary Eckardt, DPM    [address]                                        [place], [place] [address]                Office [phone]  Fax [phone]

## 2017-02-15 ENCOUNTER — Inpatient Hospital Stay (HOSPITAL_COMMUNITY)
Admission: EM | Admit: 2017-02-15 | Discharge: 2017-02-20 | DRG: 616 | Disposition: A | Discharge status: Home Health | Payer: Medicare Other | Attending: Nephrology | Admitting: Nephrology

## 2017-02-15 ENCOUNTER — Encounter (HOSPITAL_COMMUNITY): Payer: Self-pay

## 2017-02-15 DIAGNOSIS — M86272 Subacute osteomyelitis, left ankle and foot: Secondary | ICD-10-CM | POA: Diagnosis not present

## 2017-02-15 DIAGNOSIS — E1065 Type 1 diabetes mellitus with hyperglycemia: Secondary | ICD-10-CM | POA: Diagnosis present

## 2017-02-15 DIAGNOSIS — F1020 Alcohol dependence, uncomplicated: Secondary | ICD-10-CM | POA: Diagnosis present

## 2017-02-15 DIAGNOSIS — IMO0002 Reserved for concepts with insufficient information to code with codable children: Secondary | ICD-10-CM | POA: Diagnosis present

## 2017-02-15 DIAGNOSIS — E10621 Type 1 diabetes mellitus with foot ulcer: Secondary | ICD-10-CM | POA: Diagnosis not present

## 2017-02-15 DIAGNOSIS — E1052 Type 1 diabetes mellitus with diabetic peripheral angiopathy with gangrene: Secondary | ICD-10-CM | POA: Diagnosis present

## 2017-02-15 DIAGNOSIS — F1010 Alcohol abuse, uncomplicated: Secondary | ICD-10-CM | POA: Diagnosis present

## 2017-02-15 DIAGNOSIS — L03116 Cellulitis of left lower limb: Secondary | ICD-10-CM | POA: Diagnosis present

## 2017-02-15 DIAGNOSIS — M86172 Other acute osteomyelitis, left ankle and foot: Secondary | ICD-10-CM | POA: Diagnosis present

## 2017-02-15 DIAGNOSIS — E108 Type 1 diabetes mellitus with unspecified complications: Secondary | ICD-10-CM

## 2017-02-15 DIAGNOSIS — Z833 Family history of diabetes mellitus: Secondary | ICD-10-CM

## 2017-02-15 DIAGNOSIS — F17210 Nicotine dependence, cigarettes, uncomplicated: Secondary | ICD-10-CM | POA: Diagnosis present

## 2017-02-15 DIAGNOSIS — L97524 Non-pressure chronic ulcer of other part of left foot with necrosis of bone: Secondary | ICD-10-CM | POA: Diagnosis present

## 2017-02-15 DIAGNOSIS — I10 Essential (primary) hypertension: Secondary | ICD-10-CM | POA: Diagnosis present

## 2017-02-15 DIAGNOSIS — K219 Gastro-esophageal reflux disease without esophagitis: Secondary | ICD-10-CM | POA: Diagnosis present

## 2017-02-15 DIAGNOSIS — Z79891 Long term (current) use of opiate analgesic: Secondary | ICD-10-CM

## 2017-02-15 DIAGNOSIS — E43 Unspecified severe protein-calorie malnutrition: Secondary | ICD-10-CM | POA: Diagnosis present

## 2017-02-15 DIAGNOSIS — Z79899 Other long term (current) drug therapy: Secondary | ICD-10-CM

## 2017-02-15 DIAGNOSIS — Z681 Body mass index (BMI) 19 or less, adult: Secondary | ICD-10-CM

## 2017-02-15 DIAGNOSIS — E876 Hypokalemia: Secondary | ICD-10-CM | POA: Diagnosis present

## 2017-02-15 DIAGNOSIS — E11621 Type 2 diabetes mellitus with foot ulcer: Secondary | ICD-10-CM | POA: Diagnosis present

## 2017-02-15 DIAGNOSIS — I96 Gangrene, not elsewhere classified: Secondary | ICD-10-CM | POA: Diagnosis present

## 2017-02-15 DIAGNOSIS — E1069 Type 1 diabetes mellitus with other specified complication: Secondary | ICD-10-CM | POA: Diagnosis present

## 2017-02-15 DIAGNOSIS — K860 Alcohol-induced chronic pancreatitis: Secondary | ICD-10-CM | POA: Diagnosis present

## 2017-02-15 DIAGNOSIS — Z794 Long term (current) use of insulin: Secondary | ICD-10-CM

## 2017-02-15 LAB — CBC WITH DIFFERENTIAL/PLATELET
Basophils Absolute: 0 10*3/uL (ref 0.0–0.1)
Basophils Relative: 0 %
Eosinophils Absolute: 0.1 10*3/uL (ref 0.0–0.7)
Eosinophils Relative: 1 %
HCT: 38.8 % — ABNORMAL LOW (ref 39.0–52.0)
Hemoglobin: 13.1 g/dL (ref 13.0–17.0)
Lymphocytes Relative: 28 %
Lymphs Abs: 2.3 10*3/uL (ref 0.7–4.0)
MCH: 33.2 pg (ref 26.0–34.0)
MCHC: 33.8 g/dL (ref 30.0–36.0)
MCV: 98.5 fL (ref 78.0–100.0)
Monocytes Absolute: 1 10*3/uL (ref 0.1–1.0)
Monocytes Relative: 12 %
Neutro Abs: 4.9 10*3/uL (ref 1.7–7.7)
Neutrophils Relative %: 59 %
Platelets: 285 10*3/uL (ref 150–400)
RBC: 3.94 MIL/uL — ABNORMAL LOW (ref 4.22–5.81)
RDW: 13.4 % (ref 11.5–15.5)
WBC: 8.3 10*3/uL (ref 4.0–10.5)

## 2017-02-15 LAB — URINALYSIS, ROUTINE W REFLEX MICROSCOPIC
Bilirubin Urine: NEGATIVE
Glucose, UA: >=500 mg/dL — AB
Ketones, ur: 5 mg/dL — AB
Leukocytes, UA: NEGATIVE
Nitrite: NEGATIVE
Protein, ur: 100 mg/dL — AB
Specific Gravity, Urine: 1.027 (ref 1.005–1.030)
pH: 6 (ref 5.0–8.0)

## 2017-02-15 LAB — I-STAT CG4 LACTIC ACID, ED: Lactic Acid, Venous: 1.99 mmol/L (ref 0.5–1.9)

## 2017-02-15 LAB — COMPREHENSIVE METABOLIC PANEL
ALT: 21 U/L (ref 17–63)
AST: 29 U/L (ref 15–41)
Albumin: 2.9 g/dL — ABNORMAL LOW (ref 3.5–5.0)
Alkaline Phosphatase: 209 U/L — ABNORMAL HIGH (ref 38–126)
Anion gap: 11 (ref 5–15)
BUN: <5 mg/dL — ABNORMAL LOW (ref 6–20)
CO2: 21 mmol/L — ABNORMAL LOW (ref 22–32)
Calcium: 9.1 mg/dL (ref 8.9–10.3)
Chloride: 98 mmol/L — ABNORMAL LOW (ref 101–111)
Creatinine, Ser: 0.85 mg/dL (ref 0.61–1.24)
GFR calc Af Amer: >60 mL/min (ref >60)
GFR calc non Af Amer: >60 mL/min (ref >60)
Glucose, Bld: 376 mg/dL — ABNORMAL HIGH (ref 65–99)
Potassium: 4 mmol/L (ref 3.5–5.1)
Sodium: 130 mmol/L — ABNORMAL LOW (ref 135–145)
Total Bilirubin: 0.5 mg/dL (ref 0.3–1.2)
Total Protein: 7.8 g/dL (ref 6.5–8.1)

## 2017-02-15 NOTE — ED Triage Notes (Signed)
Pt complaining of L foot infection. Pt states hx DM. Pt states being seen by podiatrist for same. Pt states increasing smell and drainage. Pt ambulatory at triage.
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
## 2017-02-16 DIAGNOSIS — M86179 Other acute osteomyelitis, unspecified ankle and foot: Secondary | ICD-10-CM | POA: Diagnosis not present

## 2017-02-16 DIAGNOSIS — E1052 Type 1 diabetes mellitus with diabetic peripheral angiopathy with gangrene: Secondary | ICD-10-CM | POA: Diagnosis not present

## 2017-02-16 DIAGNOSIS — E43 Unspecified severe protein-calorie malnutrition: Secondary | ICD-10-CM | POA: Diagnosis not present

## 2017-02-16 DIAGNOSIS — E108 Type 1 diabetes mellitus with unspecified complications: Secondary | ICD-10-CM | POA: Diagnosis not present

## 2017-02-16 DIAGNOSIS — L03116 Cellulitis of left lower limb: Secondary | ICD-10-CM | POA: Diagnosis not present

## 2017-02-16 DIAGNOSIS — Z794 Long term (current) use of insulin: Secondary | ICD-10-CM | POA: Diagnosis not present

## 2017-02-16 DIAGNOSIS — M86172 Other acute osteomyelitis, left ankle and foot: Secondary | ICD-10-CM | POA: Diagnosis present

## 2017-02-16 DIAGNOSIS — Z681 Body mass index (BMI) 19 or less, adult: Secondary | ICD-10-CM | POA: Diagnosis not present

## 2017-02-16 DIAGNOSIS — F1010 Alcohol abuse, uncomplicated: Secondary | ICD-10-CM | POA: Diagnosis not present

## 2017-02-16 DIAGNOSIS — E08621 Diabetes mellitus due to underlying condition with foot ulcer: Secondary | ICD-10-CM | POA: Diagnosis not present

## 2017-02-16 DIAGNOSIS — B9689 Other specified bacterial agents as the cause of diseases classified elsewhere: Secondary | ICD-10-CM | POA: Diagnosis not present

## 2017-02-16 DIAGNOSIS — L97524 Non-pressure chronic ulcer of other part of left foot with necrosis of bone: Secondary | ICD-10-CM

## 2017-02-16 DIAGNOSIS — K219 Gastro-esophageal reflux disease without esophagitis: Secondary | ICD-10-CM | POA: Diagnosis present

## 2017-02-16 DIAGNOSIS — E10621 Type 1 diabetes mellitus with foot ulcer: Secondary | ICD-10-CM | POA: Diagnosis not present

## 2017-02-16 DIAGNOSIS — E1065 Type 1 diabetes mellitus with hyperglycemia: Secondary | ICD-10-CM | POA: Diagnosis not present

## 2017-02-16 DIAGNOSIS — K860 Alcohol-induced chronic pancreatitis: Secondary | ICD-10-CM | POA: Diagnosis not present

## 2017-02-16 DIAGNOSIS — E1069 Type 1 diabetes mellitus with other specified complication: Secondary | ICD-10-CM | POA: Diagnosis not present

## 2017-02-16 DIAGNOSIS — Z801 Family history of malignant neoplasm of trachea, bronchus and lung: Secondary | ICD-10-CM | POA: Diagnosis not present

## 2017-02-16 DIAGNOSIS — E1169 Type 2 diabetes mellitus with other specified complication: Secondary | ICD-10-CM | POA: Diagnosis not present

## 2017-02-16 DIAGNOSIS — M869 Osteomyelitis, unspecified: Secondary | ICD-10-CM | POA: Diagnosis not present

## 2017-02-16 DIAGNOSIS — L97529 Non-pressure chronic ulcer of other part of left foot with unspecified severity: Secondary | ICD-10-CM | POA: Diagnosis not present

## 2017-02-16 DIAGNOSIS — F17210 Nicotine dependence, cigarettes, uncomplicated: Secondary | ICD-10-CM | POA: Diagnosis not present

## 2017-02-16 DIAGNOSIS — M86272 Subacute osteomyelitis, left ankle and foot: Secondary | ICD-10-CM

## 2017-02-16 DIAGNOSIS — E11621 Type 2 diabetes mellitus with foot ulcer: Secondary | ICD-10-CM | POA: Diagnosis present

## 2017-02-16 DIAGNOSIS — F1020 Alcohol dependence, uncomplicated: Secondary | ICD-10-CM | POA: Diagnosis present

## 2017-02-16 DIAGNOSIS — I739 Peripheral vascular disease, unspecified: Secondary | ICD-10-CM | POA: Diagnosis not present

## 2017-02-16 DIAGNOSIS — Z79899 Other long term (current) drug therapy: Secondary | ICD-10-CM | POA: Diagnosis not present

## 2017-02-16 DIAGNOSIS — M86672 Other chronic osteomyelitis, left ankle and foot: Secondary | ICD-10-CM | POA: Diagnosis not present

## 2017-02-16 DIAGNOSIS — I10 Essential (primary) hypertension: Secondary | ICD-10-CM | POA: Diagnosis present

## 2017-02-16 DIAGNOSIS — Z89412 Acquired absence of left great toe: Secondary | ICD-10-CM | POA: Diagnosis not present

## 2017-02-16 DIAGNOSIS — I96 Gangrene, not elsewhere classified: Secondary | ICD-10-CM | POA: Diagnosis not present

## 2017-02-16 DIAGNOSIS — Z79891 Long term (current) use of opiate analgesic: Secondary | ICD-10-CM | POA: Diagnosis not present

## 2017-02-16 DIAGNOSIS — E876 Hypokalemia: Secondary | ICD-10-CM | POA: Diagnosis not present

## 2017-02-16 DIAGNOSIS — Z833 Family history of diabetes mellitus: Secondary | ICD-10-CM | POA: Diagnosis not present

## 2017-02-16 LAB — BASIC METABOLIC PANEL
Anion gap: 11 (ref 5–15)
BUN: 6 mg/dL (ref 6–20)
CALCIUM: 9.2 mg/dL (ref 8.9–10.3)
CO2: 22 mmol/L (ref 22–32)
CREATININE: 0.74 mg/dL (ref 0.61–1.24)
Chloride: 99 mmol/L — ABNORMAL LOW (ref 101–111)
GFR calc non Af Amer: >60 mL/min (ref >60)
Glucose, Bld: 236 mg/dL — ABNORMAL HIGH (ref 65–99)
Potassium: 3.5 mmol/L (ref 3.5–5.1)
SODIUM: 132 mmol/L — AB (ref 135–145)

## 2017-02-16 LAB — I-STAT CG4 LACTIC ACID, ED: Lactic Acid, Venous: 1.63 mmol/L (ref 0.5–1.9)

## 2017-02-16 LAB — MAGNESIUM: MAGNESIUM: 1.8 mg/dL (ref 1.7–2.4)

## 2017-02-16 LAB — PREALBUMIN: PREALBUMIN: 13.1 mg/dL — AB (ref 18–38)

## 2017-02-16 LAB — GLUCOSE, CAPILLARY
GLUCOSE-CAPILLARY: 401 mg/dL — AB (ref 65–99)
GLUCOSE-CAPILLARY: 150 mg/dL — AB (ref 65–99)
GLUCOSE-CAPILLARY: 78 mg/dL (ref 65–99)
Glucose-Capillary: 268 mg/dL — ABNORMAL HIGH (ref 65–99)
Glucose-Capillary: 329 mg/dL — ABNORMAL HIGH (ref 65–99)
Glucose-Capillary: 197 mg/dL — ABNORMAL HIGH (ref 65–99)

## 2017-02-16 LAB — CBC WITH DIFFERENTIAL/PLATELET
Basophils Absolute: 0.1 10*3/uL (ref 0.0–0.1)
Basophils Relative: 1 %
EOS ABS: 0.2 10*3/uL (ref 0.0–0.7)
Eosinophils Relative: 2 %
HCT: 39.5 % (ref 39.0–52.0)
Hemoglobin: 13.8 g/dL (ref 13.0–17.0)
LYMPHS ABS: 2.8 10*3/uL (ref 0.7–4.0)
Lymphocytes Relative: 32 %
MCH: 34.5 pg — AB (ref 26.0–34.0)
MCHC: 34.9 g/dL (ref 30.0–36.0)
MCV: 98.8 fL (ref 78.0–100.0)
MONO ABS: 1.2 10*3/uL — AB (ref 0.1–1.0)
Monocytes Relative: 13 %
Neutro Abs: 4.6 10*3/uL (ref 1.7–7.7)
Neutrophils Relative %: 52 %
PLATELETS: 297 10*3/uL (ref 150–400)
RBC: 4 MIL/uL — ABNORMAL LOW (ref 4.22–5.81)
RDW: 13.4 % (ref 11.5–15.5)
WBC: 8.9 10*3/uL (ref 4.0–10.5)

## 2017-02-16 LAB — SURGICAL PCR SCREEN
MRSA, PCR: NEGATIVE
Staphylococcus aureus: NEGATIVE

## 2017-02-16 MED ORDER — VITAMIN B-1 100 MG PO TABS
100.0000 mg | ORAL_TABLET | Freq: Every day | ORAL | Status: DC
  Administered 2017-02-16 – 2017-02-20 (×4): 100 mg via ORAL
  Filled 2017-02-16 (×4): qty 1

## 2017-02-16 MED ORDER — POTASSIUM CHLORIDE CRYS ER 20 MEQ PO TBCR
40.0000 meq | EXTENDED_RELEASE_TABLET | Freq: Once | ORAL | Status: AC
  Administered 2017-02-16: 40 meq via ORAL
  Filled 2017-02-16: qty 2

## 2017-02-16 MED ORDER — ENSURE ENLIVE PO LIQD: 237.0000 mL | Freq: Two times a day (BID) | ORAL | Status: DC

## 2017-02-16 MED ORDER — MAGNESIUM SULFATE 4 GM/100ML IV SOLN
4.0000 g | Freq: Once | INTRAVENOUS | Status: AC
  Administered 2017-02-16: 4 g via INTRAVENOUS
  Filled 2017-02-16: qty 100

## 2017-02-16 MED ORDER — LORAZEPAM 1 MG PO TABS: 1.0000 mg | ORAL_TABLET | Freq: Four times a day (QID) | ORAL | Status: AC | PRN

## 2017-02-16 MED ORDER — PIPERACILLIN-TAZOBACTAM 3.375 G IVPB
3.3750 g | Freq: Three times a day (TID) | INTRAVENOUS | Status: DC
  Administered 2017-02-16 – 2017-02-19 (×10): 3.375 g via INTRAVENOUS
  Filled 2017-02-16 (×10): qty 50

## 2017-02-16 MED ORDER — ENOXAPARIN SODIUM 40 MG/0.4ML ~~LOC~~ SOLN
40.0000 mg | SUBCUTANEOUS | Status: DC
  Administered 2017-02-16 – 2017-02-19 (×3): 40 mg via SUBCUTANEOUS
  Filled 2017-02-16 (×3): qty 0.4

## 2017-02-16 MED ORDER — INSULIN ASPART 100 UNIT/ML ~~LOC~~ SOLN: 0.0000 [IU] | SUBCUTANEOUS | Status: DC

## 2017-02-16 MED ORDER — INSULIN ASPART 100 UNIT/ML ~~LOC~~ SOLN
4.0000 [IU] | Freq: Three times a day (TID) | SUBCUTANEOUS | Status: DC
  Administered 2017-02-16 – 2017-02-17 (×5): 4 [IU] via SUBCUTANEOUS

## 2017-02-16 MED ORDER — THIAMINE HCL 100 MG/ML IJ SOLN
100.0000 mg | Freq: Every day | INTRAMUSCULAR | Status: DC
  Filled 2017-02-16: qty 2

## 2017-02-16 MED ORDER — VANCOMYCIN HCL IN DEXTROSE 1-5 GM/200ML-% IV SOLN
1000.0000 mg | INTRAVENOUS | Status: AC
  Administered 2017-02-16: 1000 mg via INTRAVENOUS
  Filled 2017-02-16: qty 200

## 2017-02-16 MED ORDER — PANCRELIPASE (LIP-PROT-AMYL) 36000-114000 UNITS PO CPEP
36000.0000 [IU] | ORAL_CAPSULE | Freq: Three times a day (TID) | ORAL | Status: DC
  Administered 2017-02-16 – 2017-02-20 (×12): 36000 [IU] via ORAL
  Filled 2017-02-16 (×15): qty 1

## 2017-02-16 MED ORDER — INSULIN ASPART 100 UNIT/ML ~~LOC~~ SOLN
0.0000 [IU] | Freq: Three times a day (TID) | SUBCUTANEOUS | Status: DC
  Administered 2017-02-16: 3 [IU] via SUBCUTANEOUS
  Administered 2017-02-16: 2 [IU] via SUBCUTANEOUS
  Administered 2017-02-16: 3 [IU] via SUBCUTANEOUS
  Administered 2017-02-17: 8 [IU] via SUBCUTANEOUS
  Administered 2017-02-18: 7 [IU] via SUBCUTANEOUS
  Administered 2017-02-19: 8 [IU] via SUBCUTANEOUS
  Administered 2017-02-19: 3 [IU] via SUBCUTANEOUS
  Administered 2017-02-19: 8 [IU] via SUBCUTANEOUS
  Administered 2017-02-20: 3 [IU] via SUBCUTANEOUS
  Administered 2017-02-20: 5 [IU] via SUBCUTANEOUS

## 2017-02-16 MED ORDER — LORAZEPAM 2 MG/ML IJ SOLN: 1.0000 mg | Freq: Four times a day (QID) | INTRAMUSCULAR | Status: AC | PRN

## 2017-02-16 MED ORDER — ADULT MULTIVITAMIN W/MINERALS CH
1.0000 | ORAL_TABLET | Freq: Every day | ORAL | Status: DC
  Administered 2017-02-16 – 2017-02-20 (×4): 1 via ORAL
  Filled 2017-02-16 (×4): qty 1

## 2017-02-16 MED ORDER — GLUCERNA SHAKE PO LIQD
237.0000 mL | Freq: Three times a day (TID) | ORAL | Status: DC
  Administered 2017-02-16 – 2017-02-20 (×9): 237 mL via ORAL

## 2017-02-16 MED ORDER — VANCOMYCIN HCL IN DEXTROSE 1-5 GM/200ML-% IV SOLN
1000.0000 mg | Freq: Three times a day (TID) | INTRAVENOUS | Status: DC
  Administered 2017-02-16 – 2017-02-19 (×9): 1000 mg via INTRAVENOUS
  Filled 2017-02-16 (×11): qty 200

## 2017-02-16 MED ORDER — PIPERACILLIN-TAZOBACTAM 3.375 G IVPB 30 MIN
3.3750 g | INTRAVENOUS | Status: AC
  Administered 2017-02-16: 3.375 g via INTRAVENOUS
  Filled 2017-02-16: qty 50

## 2017-02-16 MED ORDER — INSULIN NPH (HUMAN) (ISOPHANE) 100 UNIT/ML ~~LOC~~ SUSP: 35.0000 [IU] | Freq: Two times a day (BID) | SUBCUTANEOUS | Status: DC

## 2017-02-16 MED ORDER — INSULIN ASPART 100 UNIT/ML ~~LOC~~ SOLN
8.0000 [IU] | Freq: Once | SUBCUTANEOUS | Status: AC
  Administered 2017-02-16: 8 [IU] via SUBCUTANEOUS

## 2017-02-16 MED ORDER — HYDROCODONE-ACETAMINOPHEN 7.5-325 MG PO TABS
1.0000 | ORAL_TABLET | Freq: Four times a day (QID) | ORAL | Status: DC | PRN
  Administered 2017-02-16 – 2017-02-17 (×3): 1 via ORAL
  Filled 2017-02-16 (×3): qty 1

## 2017-02-16 MED ORDER — INSULIN NPH (HUMAN) (ISOPHANE) 100 UNIT/ML ~~LOC~~ SUSP
30.0000 [IU] | Freq: Two times a day (BID) | SUBCUTANEOUS | Status: DC
  Administered 2017-02-16 – 2017-02-17 (×3): 30 [IU] via SUBCUTANEOUS
  Filled 2017-02-16: qty 10

## 2017-02-16 MED ORDER — FOLIC ACID 1 MG PO TABS
1.0000 mg | ORAL_TABLET | Freq: Every day | ORAL | Status: DC
  Administered 2017-02-16 – 2017-02-20 (×4): 1 mg via ORAL
  Filled 2017-02-16 (×4): qty 1

## 2017-02-16 NOTE — Clinical Social Work Note (Signed)
CSW consulted for "Access Meds for Discharge." [facility] this request and RNCM to address. CSW signing off as no further social work needs identified. Please reconsult if new social work needs arise.   [name] HoveJeneya Niel [name], LCSWA, LCASA Clinical Social Work (Weekend) [phone]

## 2017-02-16 NOTE — Progress Notes (Signed)
Pt had a blood sugar of 376 in ED. Arrived to floor pt's blood sugar was 401 at 0240. Notified [facility].Kim at 0255. Will cont. To monitor.

## 2017-02-16 NOTE — Progress Notes (Signed)
Initial Nutrition Assessment  DOCUMENTATION CODES:  Severe malnutrition in context of chronic illness  INTERVENTION:  Continue MVI with minerals  Continue Glucerna Shake po TID, each supplement provides 220 kcal and 10 grams of protein  Educated on importance of ordering the higher protein meal items and eating well before/after surgery  NUTRITION DIAGNOSIS:  Malnutrition related to chronic illness (ETOH abuse, Uncontrolled DM, chronic wound) as evidenced by loss of >10% bw in last 6 months and severe depletion of body fat   GOAL:  Patient will meet greater than or equal to 90% of their needs  MONITOR:  PO intake, Supplement acceptance, Labs, Skin  REASON FOR ASSESSMENT:  Consult Wound healing  ASSESSMENT:  46 y/o male PMHx DM1, chronic pancreatitis related to ongoing ETOH abuse, HTN. Has DM foot ulcer/gangreene of toe since last march refractory to multiple rounds of abx/wound care. Presents for amputation of 1st metatarsal. CIWA protocol  Pt reports he eats well at home, atleast 2 meals a day. He says he follows a diabetic diet. He did not drink supplements or take any vitamins. He says he had a good appetite.   Reports a UBW in the 160s. He says he last weighed this a few months ago. Per chart review, he has lost 30 lbs in the last year, 20 lbs in the last 5 months (>10% bw) and 13 lbs n the last 3 months (8% bw), the latter 2 measurements are clinically significant.   At this time, he reports that he has a good appetite, ordered beef tips for lunch. RD explained importance of adequate nutrition preop and postop for adequate wound healing. He was agreeable to supplements.   Physical Exam: Severe fat depletion to triceps/thorax. Moderate fat depletion to orbitals. Mild to moderate muscle depletion to deltoid/clavicular muscle. Moderate interosseous wasting.   Labs: Last a1c was 2/28: 11.2-new pending, Current Glu 150-400, Na: 132, Prealbumin:13.1 Meds: Folate, Glucerna TID,  Insulin, Creon, MVI with minerals, thiamin, IV abx   Recent Labs Lab 02/15/17 1829 02/16/17 0753  NA 130* 132*  K 4.0 3.5  CL 98* 99*  CO2 21* 22  BUN <5* 6  CREATININE 0.85 0.74  CALCIUM 9.1 9.2  MG  --  1.8  GLUCOSE 376* 236*   Diet Order:  Diet Carb Modified Fluid consistency: Thin; Room service appropriate? Yes  Skin:  Gangrenous L first toe  Last BM:  Unknown  Height:  Ht Readings from Last 1 Encounters:  02/16/17 6\' 1"  (1.854 m)   Weight:  Wt Readings from Last 1 Encounters:  02/16/17 146 lb 9.7 oz (66.5 kg)   Wt Readings from Last 10 Encounters:  02/16/17 146 lb 9.7 oz (66.5 kg)  11/28/16 158 lb 11.7 oz (72 kg)  11/19/16 159 lb (72.1 kg)  09/21/16 165 lb (74.8 kg)  08/28/16 163 lb (73.9 kg)  08/16/16 163 lb (73.9 kg)  06/05/16 167 lb (75.8 kg)  05/17/16 170 lb (77.1 kg)  03/30/16 171 lb (77.6 kg)  03/16/16 171 lb (77.6 kg)   Ideal Body Weight:  83.64 kg  BMI:  Body mass index is 19.34 kg/m.  Estimated Nutritional Needs:  Kcal:  2100-2300 (32-35 kcal/kg bw) Protein:  93-106g Pro (1.4-1.6 g/kg bw) Fluid:  >2.3 L fluid (35 ml/kg)  EDUCATION NEEDS:  Education needs addressed  Christophe LouisNathan Shahzad Thomann RD, LDN, CNSC Clinical Nutrition Pager: [phone] 02/16/2017 10:00 AM
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
## 2017-02-16 NOTE — Progress Notes (Signed)
I have seen and assessed patient and agree with Dr. William HamburgerGarner's assessment and plan. Patient is a [age] year old general history of type 1 diabetes, chronic pancreatitis from chronic alcohol abuse currently ongoing, hypertension presented diabetic foot ulcer/gangrene of the left forefoot ongoing since March 2018 with no significant improvement despite multiple rounds of antibiotics. Patient is followed by his podiatrist who had as patient presented to the hospital in anticipation of amputation of first metatarsal. MRI of the foot done was also concern of osteomyelitis. Patient currently on IV vancomycin and IV Zosyn. Podiatry consultation pending.  No charge.

## 2017-02-16 NOTE — Progress Notes (Signed)
Pharmacy Antibiotic Note  [name] MedalJerel L [name] is a 46 y.o. male admitted on 02/15/2017 with L foot ulcer with osteomyelitis.  Pharmacy has been consulted for Vancomycin and Zosyn dosing.  Plan: Zosyn 3.375gm IV now over 30 min then 3.375gm IV q8h - subsequent doses over 4 hours Vancomycin 1gm IV q8h Will f/u micro data, renal function, and pt's clinical condition Vanc trough prn   Height: 6\' 1"  (185.4 cm) Weight: 146 lb 9.7 oz (66.5 kg) IBW/kg (Calculated) : 79.9  Temp (24hrs), Avg:99.3 F (37.4 C), Min:99.3 F (37.4 C), Max:99.3 F (37.4 C)   Recent Labs Lab 02/15/17 1829 02/15/17 1849 02/16/17 0109  WBC 8.3  --   --   CREATININE 0.85  --   --   LATICACIDVEN  --  1.99* 1.63    Estimated Creatinine Clearance: 102.1 mL/min (by C-G formula based on SCr of 0.85 mg/dL).    No Known Allergies  Antimicrobials this admission: 6/2 Vanc >>  6/2 Zosyn >>   Dose adjustments this admission: n/a  Microbiology results: 6/2 BCx x2:   Thank you for allowing pharmacy to be a part of this patient's care.  Lavonia Danamend, Oluwadamilola Rosamond George 02/16/2017 2:35 AM

## 2017-02-16 NOTE — Progress Notes (Signed)
Order placed by MD to give 8 units of Novo. Nurse administered 8 [facility]. Will check blood sugar in 15min. Pt has no symptoms of hyperglycemia at this time. Will cont. To monitor.

## 2017-02-16 NOTE — Progress Notes (Signed)
CM received note from CSW regarding help to pt for medication.  CM notes pt has insurance Actor(medicare) so unfortunately, does not meet criteria for MATCH (charity).  If pt is prescribed a medication which has a free trial card or coupon, CM may help. MDs please note and prescribe generics for pt to help defray cost.  CM will follow for needs.

## 2017-02-16 NOTE — Progress Notes (Signed)
Rechecked pt's blood sugar now 268. Will cont. To mont.

## 2017-02-17 ENCOUNTER — Encounter (HOSPITAL_COMMUNITY): Payer: Medicare Other

## 2017-02-17 ENCOUNTER — Encounter (HOSPITAL_COMMUNITY): Payer: Self-pay | Admitting: *Deleted

## 2017-02-17 DIAGNOSIS — E10621 Type 1 diabetes mellitus with foot ulcer: Principal | ICD-10-CM

## 2017-02-17 LAB — GLUCOSE, CAPILLARY
GLUCOSE-CAPILLARY: 200 mg/dL — AB (ref 65–99)
GLUCOSE-CAPILLARY: 54 mg/dL — AB (ref 65–99)
GLUCOSE-CAPILLARY: 240 mg/dL — AB (ref 65–99)
GLUCOSE-CAPILLARY: 194 mg/dL — AB (ref 65–99)
Glucose-Capillary: 72 mg/dL (ref 65–99)
Glucose-Capillary: 284 mg/dL — ABNORMAL HIGH (ref 65–99)
Glucose-Capillary: 53 mg/dL — ABNORMAL LOW (ref 65–99)

## 2017-02-17 LAB — BASIC METABOLIC PANEL
Anion gap: 12 (ref 5–15)
BUN: 6 mg/dL (ref 6–20)
CHLORIDE: 97 mmol/L — AB (ref 101–111)
CO2: 24 mmol/L (ref 22–32)
CREATININE: 0.73 mg/dL (ref 0.61–1.24)
Calcium: 9.5 mg/dL (ref 8.9–10.3)
GFR calc Af Amer: >60 mL/min (ref >60)
GLUCOSE: 61 mg/dL — AB (ref 65–99)
POTASSIUM: 3.3 mmol/L — AB (ref 3.5–5.1)
Sodium: 133 mmol/L — ABNORMAL LOW (ref 135–145)

## 2017-02-17 LAB — HEMOGLOBIN A1C
HEMOGLOBIN A1C: 12.4 % — AB (ref 4.8–5.6)
Mean Plasma Glucose: 309 mg/dL

## 2017-02-17 LAB — CBC WITH DIFFERENTIAL/PLATELET
Basophils Absolute: 0 10*3/uL (ref 0.0–0.1)
Basophils Relative: 0 %
EOS ABS: 0.2 10*3/uL (ref 0.0–0.7)
EOS PCT: 2 %
HCT: 41.3 % (ref 39.0–52.0)
Hemoglobin: 14 g/dL (ref 13.0–17.0)
LYMPHS ABS: 4 10*3/uL (ref 0.7–4.0)
LYMPHS PCT: 43 %
MCH: 33.6 pg (ref 26.0–34.0)
MCHC: 33.9 g/dL (ref 30.0–36.0)
MCV: 99 fL (ref 78.0–100.0)
MONO ABS: 1.2 10*3/uL — AB (ref 0.1–1.0)
MONOS PCT: 13 %
Neutro Abs: 3.9 10*3/uL (ref 1.7–7.7)
Neutrophils Relative %: 42 %
PLATELETS: 343 10*3/uL (ref 150–400)
RBC: 4.17 MIL/uL — AB (ref 4.22–5.81)
RDW: 13.2 % (ref 11.5–15.5)
WBC: 9.3 10*3/uL (ref 4.0–10.5)

## 2017-02-17 LAB — MAGNESIUM: Magnesium: 2.1 mg/dL (ref 1.7–2.4)

## 2017-02-17 MED ORDER — HYDROCODONE-ACETAMINOPHEN 7.5-325 MG PO TABS: 1.0000 | ORAL_TABLET | ORAL | Status: DC | PRN

## 2017-02-17 MED ORDER — MORPHINE SULFATE (PF) 4 MG/ML IV SOLN
2.0000 mg | INTRAVENOUS | Status: DC | PRN
  Administered 2017-02-17: 2 mg via INTRAVENOUS
  Filled 2017-02-17: qty 1

## 2017-02-17 MED ORDER — INSULIN NPH (HUMAN) (ISOPHANE) 100 UNIT/ML ~~LOC~~ SUSP
15.0000 [IU] | Freq: Two times a day (BID) | SUBCUTANEOUS | Status: DC
  Filled 2017-02-17: qty 10

## 2017-02-17 MED ORDER — INSULIN ASPART PROT & ASPART (70-30 MIX) 100 UNIT/ML ~~LOC~~ SUSP
15.0000 [IU] | Freq: Two times a day (BID) | SUBCUTANEOUS | Status: DC
  Filled 2017-02-17: qty 10

## 2017-02-17 MED ORDER — POTASSIUM CHLORIDE CRYS ER 20 MEQ PO TBCR
40.0000 meq | EXTENDED_RELEASE_TABLET | ORAL | Status: AC
  Administered 2017-02-17 (×2): 40 meq via ORAL
  Filled 2017-02-17 (×2): qty 2

## 2017-02-17 MED ORDER — HYDROCODONE-ACETAMINOPHEN 7.5-325 MG PO TABS
2.0000 | ORAL_TABLET | ORAL | Status: DC | PRN
  Administered 2017-02-17 – 2017-02-20 (×9): 2 via ORAL
  Filled 2017-02-17 (×9): qty 2

## 2017-02-17 MED ORDER — MAGNESIUM SULFATE 4 GM/100ML IV SOLN: 4.0000 g | INTRAVENOUS | Status: DC | PRN

## 2017-02-17 NOTE — Progress Notes (Signed)
PROGRESS NOTE    [name]  ZOX:[id number] DOB: [birth year] DOA: 02/15/2017 PCP: [facility]    Brief Narrative:  Patient is a 46 year gentleman history of diabetes mellitus type 1, alcohol induced chronic pancreatitis, ongoing alcohol abuse, hypertension presenting with diabetic foot ulcer/gangrene of the left forefoot ongoing since March with no significant improvement despite multiple rounds of antibiotics. Patient's podiatrist as patient to present to the hospital for admission for probable amputation to be performed.   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 Despite multiple rounds of antibiotics and wound care with podiatry patient's also has rapidly deteriorated per office note of 02/12/2017 of [facility] Doctor Gala Lewandowsky. Podiatry/Dr. Logan Bores asked patient to present to the hospital to perform amputation of first metatarsal as osteomyelitis noted on MRI of 02/06/2017. Patient afebrile. Will check wound cultures. Podiatry consultation pending. Continue empiric IV vancomycin and IV Zosyn. Pain management. Per podiatry. Will keep patient nothing by mouth after midnight. Follow.  #2 poorly controlled type 1 diabetes mellitus Hemoglobin A1c is 12.4 02/16/2017. Patient noted to have low CBG of 54 today just prior to lunch. Patient states has been very compliant with his medications. As noted on med rec that patient take 70/30  60 units at bedtime. Patient was placed on NPH 30 units daily  however CBGs have been low. Will discontinue long-acting insulin and meal coverage insulin. Sliding scale insulin and monitor.  #3 history of alcohol abuse No signs or symptoms of withdrawal. Stable. Continue Ativan withdrawal protocol. Continue thiamine and folic acid and multivitamin.  #4 severe protein calorie malnutrition Continue Glucerna.  #5 history of alcohol-induced chronic pancreatitis Stable. Continue Creon.   DVT prophylaxis: Lovenox Code Status: Full Family Communication: Updated patient. No family at bedside. Disposition Plan: Pending evaluation and management per podiatry.   Consultants:   Podiatry pending Dr. Logan Bores  Procedures:   None  Antimicrobials:   IV Zosyn 02/16/2017  IV vancomycin 02/16/2017   Subjective: Patient sitting up at bedside getting ready to eat lunch. Patient denies any chest pain or shortness of breath. Per nursing patient noted to have a CBG in the 50s however patient asymptomatic.  Objective: Vitals:   02/16/17 0244 02/16/17 1500 02/16/17 1950 02/17/17 0356  BP: (!) 148/96 132/86 (!) 149/98 132/83  Pulse: 89 92 83 83  Resp: 16 16 16 16   Temp: 98.2 F (36.8 C) 98 F (36.7 C) 99 F (37.2 C) 98.6 F (37 C)  TempSrc: Oral Oral Oral Oral  SpO2: 100% 100% 100% 100%  Weight: 66.5 kg (146 lb 9.7 oz)     Height: 6\' 1"  (1.854 m)       Intake/Output Summary (Last 24 hours) at 02/17/17 1228 Last data filed at 02/17/17 0800  Gross per 24 hour  Intake              730 ml  Output             2500 ml  Net            -  1770 ml   Filed Weights   02/16/17 0017 02/16/17 0244  Weight: 66.5 kg (146 lb 9.7 oz) 66.5 kg (146 lb 9.7 oz)    Examination:  General exam: Appears calm and comfortable  Respiratory system: Clear to auscultation. Respiratory effort normal. Cardiovascular system: S1 & S2 heard, RRR. No JVD, murmurs, rubs, gallops or clicks. No pedal edema. Gastrointestinal system: Abdomen is nondistended, soft and nontender. No  organomegaly or masses felt. Normal bowel sounds heard. Central nervous system: Alert and oriented. No focal neurological deficits. Extremities: Left foot wrapped in bandage.  Skin: No rashes, lesions or ulcers Psychiatry: Judgement and insight appear normal. Mood & affect appropriate.     Data Reviewed: I have personally reviewed following labs and imaging studies  CBC:  Recent Labs Lab 02/15/17 1829 02/16/17 0753 02/17/17 0447  WBC 8.3 8.9 9.3  NEUTROABS 4.9 4.6 3.9  HGB 13.1 13.8 14.0  HCT 38.8* 39.5 41.3  MCV 98.5 98.8 99.0  PLT 285 297 343   Basic Metabolic Panel:  Recent Labs Lab 02/15/17 1829 02/16/17 0753 02/17/17 0447  NA 130* 132* 133*  K 4.0 3.5 3.3*  CL 98* 99* 97*  CO2 21* 22 24  GLUCOSE 376* 236* 61*  BUN <5* 6 6  CREATININE 0.85 0.74 0.73  CALCIUM 9.1 9.2 9.5  MG  --  1.8 2.1   GFR: Estimated Creatinine Clearance: 108.5 mL/min (by C-G formula based on SCr of 0.73 mg/dL). Liver Function Tests:  Recent Labs Lab 02/15/17 1829  AST 29  ALT 21  ALKPHOS 209*  BILITOT 0.5  PROT 7.8  ALBUMIN 2.9*   No results for input(s): LIPASE, AMYLASE in the last 168 hours. No results for input(s): AMMONIA in the last 168 hours. Coagulation Profile: No results for input(s): INR, PROTIME in the last 168 hours. Cardiac Enzymes: No results for input(s): CKTOTAL, CKMB, CKMBINDEX, TROPONINI in the last 168 hours. BNP (last 3 results) No results for input(s): PROBNP in the last 8760 hours. HbA1C:  Recent Labs  02/16/17 0753  HGBA1C 12.4*   CBG:  Recent Labs Lab 02/16/17 0622 02/16/17 1251 02/16/17 2124 02/17/17 0555 02/17/17 1156  GLUCAP 150* 197* 78 72 54*   Lipid Profile: No results for input(s): CHOL, HDL, LDLCALC, TRIG, CHOLHDL, LDLDIRECT in the last 72 hours. Thyroid Function Tests: No results for input(s): TSH, T4TOTAL, FREET4, T3FREE, THYROIDAB in the last 72 hours. Anemia Panel: No results for input(s): VITAMINB12, FOLATE, FERRITIN,  TIBC, IRON, RETICCTPCT in the last 72 hours. Sepsis Labs:  Recent Labs Lab 02/15/17 1849 02/16/17 0109  LATICACIDVEN 1.99* 1.63    Recent Results (from the past 240 hour(s))  Surgical pcr screen     Status: None   Collection Time: 02/16/17  3:03 AM  Result Value Ref Range Status   MRSA, PCR NEGATIVE NEGATIVE Final   Staphylococcus aureus NEGATIVE NEGATIVE Final    Comment:        The Xpert SA Assay (FDA approved for NASAL specimens in patients over 47 years of age), is one component of a comprehensive surveillance program.  Test performance has been validated by [facility] for patients greater than or equal to 47 year old. It is not intended to diagnose infection nor to guide or monitor treatment.          Radiology Studies: No results found.      Scheduled Meds: . enoxaparin (LOVENOX) injection  40 mg Subcutaneous Q24H  . feeding supplement (GLUCERNA SHAKE)  237 mL Oral TID  BM  . folic acid  1 mg Oral Daily  . insulin aspart  0-15 Units Subcutaneous TID WC  . insulin aspart  4 Units Subcutaneous TID WC  . insulin NPH Human  15 Units Subcutaneous BID AC & HS  . lipase/protease/amylase  36,000 Units Oral TID WC  . multivitamin with minerals  1 tablet Oral Daily  . potassium chloride  40 mEq Oral Q4H  . thiamine  100 mg Oral Daily   Or  . thiamine  100 mg Intravenous Daily   Continuous Infusions: . piperacillin-tazobactam (ZOSYN)  IV Stopped (02/17/17 1204)  . vancomycin Stopped (02/17/17 1204)     LOS: 1 day    Time spent: 35 minutes    [name] Huntoon, MD [facility] Pager [phone]  If 7PM-7AM, please contact night-coverage [email or web address] Password [facility] 02/17/2017, 12:28 PM

## 2017-02-18 ENCOUNTER — Encounter (HOSPITAL_COMMUNITY): Payer: Self-pay | Admitting: Certified Registered Nurse Anesthetist

## 2017-02-18 ENCOUNTER — Encounter (HOSPITAL_COMMUNITY): Payer: Medicare Other

## 2017-02-18 ENCOUNTER — Inpatient Hospital Stay (HOSPITAL_COMMUNITY): Payer: Medicare Other | Admitting: Certified Registered Nurse Anesthetist

## 2017-02-18 ENCOUNTER — Encounter (HOSPITAL_COMMUNITY)
Admission: EM | Disposition: A | Discharge status: Home Health | Payer: Self-pay | Source: Home / Self Care | Attending: Internal Medicine

## 2017-02-18 DIAGNOSIS — M86672 Other chronic osteomyelitis, left ankle and foot: Secondary | ICD-10-CM

## 2017-02-18 HISTORY — PX: AMPUTATION: SHX166

## 2017-02-18 LAB — CBC
HEMATOCRIT: 39.7 % (ref 39.0–52.0)
Hemoglobin: 13.4 g/dL (ref 13.0–17.0)
MCH: 33.7 pg (ref 26.0–34.0)
MCHC: 33.8 g/dL (ref 30.0–36.0)
MCV: 99.7 fL (ref 78.0–100.0)
Platelets: 324 10*3/uL (ref 150–400)
RBC: 3.98 MIL/uL — ABNORMAL LOW (ref 4.22–5.81)
RDW: 13.1 % (ref 11.5–15.5)
WBC: 7.8 10*3/uL (ref 4.0–10.5)

## 2017-02-18 LAB — GLUCOSE, CAPILLARY
GLUCOSE-CAPILLARY: 282 mg/dL — AB (ref 65–99)
GLUCOSE-CAPILLARY: 170 mg/dL — AB (ref 65–99)
Glucose-Capillary: 114 mg/dL — ABNORMAL HIGH (ref 65–99)
Glucose-Capillary: 124 mg/dL — ABNORMAL HIGH (ref 65–99)
Glucose-Capillary: 69 mg/dL (ref 65–99)
Glucose-Capillary: 242 mg/dL — ABNORMAL HIGH (ref 65–99)

## 2017-02-18 LAB — BASIC METABOLIC PANEL
Anion gap: 6 (ref 5–15)
BUN: 6 mg/dL (ref 6–20)
CALCIUM: 9.4 mg/dL (ref 8.9–10.3)
CO2: 25 mmol/L (ref 22–32)
Chloride: 101 mmol/L (ref 101–111)
Creatinine, Ser: 0.67 mg/dL (ref 0.61–1.24)
GFR calc Af Amer: >60 mL/min (ref >60)
GFR calc non Af Amer: >60 mL/min (ref >60)
GLUCOSE: 133 mg/dL — AB (ref 65–99)
Potassium: 4.3 mmol/L (ref 3.5–5.1)
Sodium: 132 mmol/L — ABNORMAL LOW (ref 135–145)

## 2017-02-18 LAB — CG4 I-STAT (LACTIC ACID): Lactic Acid, Venous: 1.41 mmol/L (ref 0.5–1.9)

## 2017-02-18 SURGERY — AMPUTATION, FOOT, RAY
Anesthesia: Monitor Anesthesia Care | Laterality: Left

## 2017-02-18 MED ORDER — LIDOCAINE-EPINEPHRINE (PF) 1.5 %-1:200000 IJ SOLN
INTRAMUSCULAR | Status: DC | PRN
  Administered 2017-02-18: 5 mL via PERINEURAL

## 2017-02-18 MED ORDER — PROPOFOL 10 MG/ML IV BOLUS
INTRAVENOUS | Status: DC | PRN
  Administered 2017-02-18: 30 mg via INTRAVENOUS

## 2017-02-18 MED ORDER — DEXTROSE 50 % IV SOLN
INTRAVENOUS | Status: AC
Start: 2017-02-18 — End: 2017-02-18
  Administered 2017-02-18: 12.5 g via INTRAVENOUS
  Filled 2017-02-18: qty 50

## 2017-02-18 MED ORDER — LIDOCAINE HCL 2 % IJ SOLN
INTRAMUSCULAR | Status: DC | PRN
  Administered 2017-02-18: 20 mL

## 2017-02-18 MED ORDER — 0.9 % SODIUM CHLORIDE (POUR BTL) OPTIME
TOPICAL | Status: DC | PRN
  Administered 2017-02-18: 1000 mL

## 2017-02-18 MED ORDER — FENTANYL CITRATE (PF) 250 MCG/5ML IJ SOLN
INTRAMUSCULAR | Status: AC
  Filled 2017-02-18: qty 5

## 2017-02-18 MED ORDER — PROMETHAZINE HCL 25 MG PO TABS: 12.5000 mg | ORAL_TABLET | ORAL | Status: DC | PRN

## 2017-02-18 MED ORDER — FENTANYL CITRATE (PF) 100 MCG/2ML IJ SOLN: 25.0000 ug | INTRAMUSCULAR | Status: DC | PRN

## 2017-02-18 MED ORDER — BUPIVACAINE-EPINEPHRINE (PF) 0.5% -1:200000 IJ SOLN
INTRAMUSCULAR | Status: DC | PRN
  Administered 2017-02-18: 25 mL via PERINEURAL

## 2017-02-18 MED ORDER — MIDAZOLAM HCL 2 MG/2ML IJ SOLN
INTRAMUSCULAR | Status: AC
Start: 2017-02-18 — End: 2017-02-18
  Filled 2017-02-18: qty 2

## 2017-02-18 MED ORDER — CHLORHEXIDINE GLUCONATE 4 % EX LIQD: 60.0000 mL | Freq: Once | CUTANEOUS | Status: DC

## 2017-02-18 MED ORDER — LABETALOL HCL 5 MG/ML IV SOLN
INTRAVENOUS | Status: AC
  Filled 2017-02-18: qty 4

## 2017-02-18 MED ORDER — LIDOCAINE HCL 2 % IJ SOLN
INTRAMUSCULAR | Status: AC
  Filled 2017-02-18: qty 40

## 2017-02-18 MED ORDER — BUPIVACAINE HCL (PF) 0.5 % IJ SOLN
INTRAMUSCULAR | Status: DC | PRN
  Administered 2017-02-18: 30 mL

## 2017-02-18 MED ORDER — LIVING WELL WITH DIABETES BOOK
Freq: Once | Status: AC
  Administered 2017-02-18: 15:00:00
  Filled 2017-02-18: qty 1

## 2017-02-18 MED ORDER — PROPOFOL 10 MG/ML IV BOLUS
INTRAVENOUS | Status: AC
  Filled 2017-02-18: qty 20

## 2017-02-18 MED ORDER — PROPOFOL 500 MG/50ML IV EMUL
INTRAVENOUS | Status: DC | PRN
  Administered 2017-02-18: 75 ug/kg/min via INTRAVENOUS

## 2017-02-18 MED ORDER — FENTANYL CITRATE (PF) 100 MCG/2ML IJ SOLN
INTRAMUSCULAR | Status: DC | PRN
  Administered 2017-02-18: 50 ug via INTRAVENOUS

## 2017-02-18 MED ORDER — MIDAZOLAM HCL 5 MG/5ML IJ SOLN
INTRAMUSCULAR | Status: DC | PRN
  Administered 2017-02-18: 2 mg via INTRAVENOUS

## 2017-02-18 MED ORDER — 0.9 % SODIUM CHLORIDE (POUR BTL) OPTIME
TOPICAL | Status: DC | PRN
  Administered 2017-02-18: 3000 mL

## 2017-02-18 MED ORDER — BUPIVACAINE HCL (PF) 0.5 % IJ SOLN
INTRAMUSCULAR | Status: AC
  Filled 2017-02-18: qty 30

## 2017-02-18 MED ORDER — SODIUM CHLORIDE 0.9% FLUSH
3.0000 mL | Freq: Two times a day (BID) | INTRAVENOUS | Status: DC
  Administered 2017-02-18 – 2017-02-19 (×2): 3 mL via INTRAVENOUS

## 2017-02-18 MED ORDER — SODIUM CHLORIDE 0.9 % IV SOLN
INTRAVENOUS | Status: DC | PRN
  Administered 2017-02-18: 17:00:00 via INTRAVENOUS

## 2017-02-18 MED ORDER — SODIUM CHLORIDE 0.9 % IV SOLN: 250.0000 mL | INTRAVENOUS | Status: DC | PRN

## 2017-02-18 MED ORDER — DEXTROSE 50 % IV SOLN
12.5000 g | Freq: Once | INTRAVENOUS | Status: AC
  Administered 2017-02-18: 12.5 g via INTRAVENOUS
  Filled 2017-02-18: qty 50

## 2017-02-18 MED ORDER — OXYCODONE HCL 5 MG PO TABS: 5.0000 mg | ORAL_TABLET | Freq: Once | ORAL | Status: DC | PRN

## 2017-02-18 MED ORDER — OXYCODONE-ACETAMINOPHEN 5-325 MG PO TABS
1.0000 | ORAL_TABLET | ORAL | Status: DC | PRN
  Administered 2017-02-19 – 2017-02-20 (×3): 2 via ORAL
  Filled 2017-02-18 (×3): qty 2

## 2017-02-18 MED ORDER — SODIUM CHLORIDE 0.9% FLUSH: 3.0000 mL | INTRAVENOUS | Status: DC | PRN

## 2017-02-18 MED ORDER — OXYCODONE HCL 5 MG/5ML PO SOLN: 5.0000 mg | Freq: Once | ORAL | Status: DC | PRN

## 2017-02-18 MED ORDER — LABETALOL HCL 5 MG/ML IV SOLN
10.0000 mg | INTRAVENOUS | Status: DC | PRN
  Administered 2017-02-18: 10 mg via INTRAVENOUS

## 2017-02-18 SURGICAL SUPPLY — 31 items
BANDAGE ELASTIC 4 VELCRO ST LF (GAUZE/BANDAGES/DRESSINGS) ×3 IMPLANT
BLADE LONG MED 31MMX9MM (MISCELLANEOUS) ×1
BLADE LONG MED 31X9 (MISCELLANEOUS) ×2 IMPLANT
BLADE OSCILLATING /SAGITTAL (BLADE) ×3 IMPLANT
BNDG GAUZE ELAST 4 BULKY (GAUZE/BANDAGES/DRESSINGS) ×3 IMPLANT
CHLORAPREP W/TINT 26ML (MISCELLANEOUS) ×3 IMPLANT
CONT SPEC 4OZ CLIKSEAL STRL BL (MISCELLANEOUS) ×6 IMPLANT
COVER SURGICAL LIGHT HANDLE (MISCELLANEOUS) ×3 IMPLANT
CUFF TOURNIQUET SINGLE 24IN (TOURNIQUET CUFF) ×3 IMPLANT
DRSG PAD ABDOMINAL 8X10 ST (GAUZE/BANDAGES/DRESSINGS) ×3 IMPLANT
ELECT PENCIL ROCKER SW 15FT (MISCELLANEOUS) ×3 IMPLANT
ELECT REM PT RETURN 9FT ADLT (ELECTROSURGICAL) ×3
ELECTRODE REM PT RTRN 9FT ADLT (ELECTROSURGICAL) ×1 IMPLANT
GAUZE PACKING IODOFORM 1/4X15 (GAUZE/BANDAGES/DRESSINGS) ×3 IMPLANT
GAUZE SPONGE 4X4 12PLY STRL (GAUZE/BANDAGES/DRESSINGS) ×3 IMPLANT
GAUZE XEROFORM 1X8 LF (GAUZE/BANDAGES/DRESSINGS) ×3 IMPLANT
GLOVE BIO SURGEON STRL SZ8 (GLOVE) ×3 IMPLANT
GLOVE BIOGEL PI IND STRL 6.5 (GLOVE) ×2 IMPLANT
GLOVE BIOGEL PI IND STRL 8 (GLOVE) ×1 IMPLANT
GLOVE BIOGEL PI INDICATOR 6.5 (GLOVE) ×4
GLOVE BIOGEL PI INDICATOR 8 (GLOVE) ×2
GLOVE SURG SS PI 6.5 STRL IVOR (GLOVE) ×3 IMPLANT
GOWN STRL REUS W/ TWL LRG LVL3 (GOWN DISPOSABLE) ×2 IMPLANT
GOWN STRL REUS W/TWL LRG LVL3 (GOWN DISPOSABLE) ×4
HANDPIECE INTERPULSE COAX TIP (DISPOSABLE) ×3
NEEDLE 27GAX1X1/2 (NEEDLE) ×3 IMPLANT
NS IRRIG 1000ML POUR BTL (IV SOLUTION) ×3 IMPLANT
SET HNDPC FAN SPRY TIP SCT (DISPOSABLE) ×1 IMPLANT
SUT PROLENE 3 0 PS 1 (SUTURE) ×9 IMPLANT
SYR CONTROL 10ML LL (SYRINGE) ×3 IMPLANT
YANKAUER SUCT BULB TIP NO VENT (SUCTIONS) ×3 IMPLANT

## 2017-02-18 NOTE — Op Note (Signed)
OPERATIVE REPORT Patient name: [name] MRN: [id number] DOB: 04-11-1971  DOS:  02/18/2017  Preop Dx: Osteomyelitis first metatarsal left foot. Diabetic foot ulcer left foot. Deep wound abscess left foot. Postop Dx: same  Procedure:  1. Partial first ray amputation left foot 2. Incision and drainage left foot  Surgeon: Felecia Shelling DPM  Anesthesia: 50-50 mixture of 2% lidocaine plain with 0.5% Marcaine plain totaling 10 mL infiltrated in the patient's left lower extremity  Hemostasis: Ankle tourniquet inflated to a pressure of 300 mmHg after esmarch exsanguination   EBL: 20 mL Materials: None Injectables: None Pathology: First metatarsal left foot. Deep wound culture left foot.  Condition: The patient tolerated the procedure and anesthesia well. No complications noted or reported   Justification for procedure: The patient is a 46 y.o. Male with a history of uncontrolled diabetes mellitus who presents today for surgical correction of osteomyelitis to the head of the first metatarsal left foot as well as a deep wound abscess and ulcer sub-first MTP left foot. All conservative modalities of been unsuccessful in providing any sort of satisfactory alleviation of symptoms with the patient. The patient was told benefits as well as possible side effects of the surgery. The patient consented for surgical correction. The patient consent form was reviewed. All patient questions were answered. No guarantees were expressed or implied. The patient and the surgeon boson the patient consent form with the witness present and placed in the patient's chart.   Procedure in Detail: The patient was brought to the operating room, placed in the operating table in the supine position at which time an aseptic scrub and drape were performed about the patient's respective lower extremity after anesthesia was induced as described above. Attention was then directed to the surgical area where procedure  number one commenced.  Procedure #1: Partial first ray amputation left foot A fishmouth type of incision was planned and made overlying the first metatarsal phalangeal joint of the left foot. The incision was carried down to the level of bone with care taken to cut clamped ligated or retracted way all small neurovascular structures traversing the incision site. The apices of the incision site were both dorsal and plantar. Sharp tissue dissection was utilized around the capsular and pericapsular tissue of the first metatarsal phalangeal joint. The great toe was grasped with a sharp towel clamp and distracted and removed in toto and placing sterile specimen container and sent to pathology. Intraoperative evaluation indicated that there was more proximal necrotic bone noted to the head and distal one half of the first metatarsal. Sharp tissue dissection was utilized to expose the first metatarsal and an osteotomy was performed using a sagittal blade with a sagittal saw in a dorsal distal to proximal plantar orientation with a slight obliquity. The head of the first metatarsal at as well as the sesamoidal apparatus was removed in toto and placing sterile specimen container and sent to pathology.  Procedure #2: Incision and drainage left foot At this time a combination of blunt and sharp tissue dissection was utilized to resect away all necrotic nonviable tissue. All purulent drainage was expressed from the amputation site. Copious irrigation of the amputation site was utilized using pulse lavage and 3 L of normal saline. After pulse lavage, the amputation site was dried in preparation for routine layered soft tissue closure. 3-0 Prolene suture was utilized to reapproximate superficial skin edges.  Dry sterile compressive dressings were then applied to all previously mentioned incision sites about the  patient's lower extremity. The tourniquet which was used for hemostasis was deflated. All normal neurovascular  responses including pink color and warmth returned all the digits of patient's lower extremity.  The patient was then transferred from the operating room to the recovery room having tolerated the procedure and anesthesia well. All vital signs are stable. After a brief stay in the recovery room the patient was readmitted to his inpatient room with adequate prescriptions for analgesia. Verbal as well as written instructions were provided for the patient regarding wound care. The patient is to keep the dressings clean dry and intact until they are to follow surgeon Dr. Gala LewandowskyBrent Evans in the office upon discharge.   Felecia ShellingBrent M. Evans, DPM [facility]  Dr. Felecia ShellingBrent M. Evans, DPM    [address]                                        [place], [place] [address]                Office [phone]  Fax [phone]

## 2017-02-18 NOTE — Progress Notes (Signed)
Dr. Maple HudsonMoser notified of 69 blood sugar. Orders received.

## 2017-02-18 NOTE — Consult Note (Signed)
   HPI: 46 year old male with a history of diabetes mellitus and ulcers to the lower extremities with a history of infection presents as an inpatient for evaluation of cellulitis with abscess and osteomyelitis to the left foot secondary to diabetes mellitus. Patient initially presented to the emergency department on Friday, 02/15/2017 when he was admitted and podiatry consult.     Physical Exam: General: The patient is alert and oriented x3 in no acute distress.  Dermatology: Ulcer noted to the sub-first MPJ of the left foot and first interdigital webspace. The ulcer probes to bone. There is purulent drainage noted with a strong malodor. Periwound integrity is macerated. There is exposed bone muscle-tendon ligament and joint.  Vascular: Palpable pedal pulses bilaterally.   Neurological: Epicritic and protective threshold absent bilateral  Musculoskeletal Exam: Range of motion within normal limits to all pedal and ankle joints bilateral. Muscle strength 5/5 in all groups bilateral.  Previous MRI taken on 02/06/2017 was consistent with clinical findings of osteomyelitis to the first metatarsal phalangeal joint.  Assessment: 1. Osteomyelitis left foot 2. Ulcer left foot secondary to diabetes mellitus 3. Cellulitis left foot  Plan of Care:  1. Patient was evaluated. 2. Patient understands that he has an acute infection with osteomyelitis of the first MPJ left foot. Patient will require partial first ray amputation left foot. Patient understands this is a salvage procedure. Patient may also require partial second ray amputation as well. All possible complications and details the procedure were explained. All patient questions were answered. No guarantees were expressed or implied. 3. Patient scheduled for surgical intervention today after 5 PM. Nothing by mouth after breakfast 4.  patient will also likely require long-term IV antibiotics 5. Return to clinic within one week after discharge     Felecia ShellingBrent M. Evans, DPM [facility]  Dr. Felecia ShellingBrent M. Evans, DPM    [address]                                        [place], [place] [address]                Office [phone]  Fax [phone](336) [phone]

## 2017-02-18 NOTE — Interval H&P Note (Signed)
History and Physical Interval Note:  02/18/2017 7:46 PM  [name]  has presented today for surgery, with the diagnosis of Foot wound  The various methods of treatment have been discussed with the patient and family. After consideration of risks, benefits and other options for treatment, the patient has consented to  Procedure(s): PARTIAL FIRST RAY AMPUTATION LEFT FOOT. INCISION AND DRAINAGE LEFT FOOT. (Left) as a surgical intervention .  The patient's history has been reviewed, patient examined, no change in status, stable for surgery.  I have reviewed the patient's chart and labs.  Questions were answered to the patient's satisfaction.     Felecia ShellingBrent M Evans

## 2017-02-18 NOTE — Progress Notes (Signed)
Pharmacy Antibiotic Note  [name] is a 46 y.o. male admitted on 02/15/2017 with diabetic foot infection.  Pharmacy has been consulted for Vancomycin and Zosyn dosing.  Pt with cellulitis with abscess and osteomyelitis, for partial ray amputation this evening.  (+)Purulent drainage.  Plan: Continue Vancomycin 1000mg  IV q8 Continue Zosyn 3.375g IV q8, infusing over 4hr Plan Vanc trough on 6/5 F/U culture results & plans for long term IV antibiotics  Height: 6\' 1"  (185.4 cm) Weight: 146 lb 9.7 oz (66.5 kg) IBW/kg (Calculated) : 79.9  Temp (24hrs), Avg:98.5 F (36.9 C), Min:98.5 F (36.9 C), Max:98.6 F (37 C)   Recent Labs Lab 02/15/17 1829 02/15/17 1849 02/16/17 0109 02/16/17 0753 02/17/17 0447 02/18/17 0412  WBC 8.3  --   --  8.9 9.3 7.8  CREATININE 0.85  --   --  0.74 0.73 0.67  LATICACIDVEN  --  1.99* 1.63  --   --   --     Estimated Creatinine Clearance: 108.5 mL/min (by C-G formula based on SCr of 0.67 mg/dL).    No Known Allergies  Antimicrobials this admission: 6/2 Vanc >>  6/2 Zosyn >>   Dose adjustments this admission: n/a  Microbiology results: 6/2 BCx x2: ntd 6/3 L-foot:  GPC pairs/chains 5/22  L-foot wound:  Serratia mar, Enterobacter cloacae (both s: Zosyn) & GBS  Thank you for allowing pharmacy to be a part of this patient's care.  Alvester MorinKendra Seyon Strader, B.S., PharmD Clinical Pharmacist [facility]

## 2017-02-18 NOTE — Progress Notes (Signed)
PROGRESS NOTE    IBROHIM SIMMERS  ZOX:[id number] DOB: [birth year] DOA: 02/15/2017 PCP: [facility]    Brief Narrative:  Patient is a 46 year gentleman history of diabetes mellitus type 1, alcohol induced chronic pancreatitis, ongoing alcohol abuse, hypertension presenting with diabetic foot ulcer/gangrene of the left forefoot ongoing since March with no significant improvement despite multiple rounds of antibiotics. Patient's podiatrist as patient to present to the hospital for admission for probable amputation to be performed.   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 Despite multiple rounds of antibiotics and wound care with podiatry patient's also has rapidly deteriorated per office note of 02/12/2017 of [facility] Doctor Gala Lewandowsky. Podiatry/Dr. Logan Bores asked patient to present to the hospital to perform amputation of first metatarsal as osteomyelitis noted on MRI of 02/06/2017. Patient afebrile. Wound cultures pending. Patient be seen in consultation by podiatry recommending partial first ray amputation left foot to be done today 02/18/2017. Continue empiric IV vancomycin and IV Zosyn. Pain management. Per podiatry. Will consult with ID tomorrow for antibiotic duration and recommendations.   #2 poorly controlled type 1 diabetes mellitus Hemoglobin A1c is 12.4 02/16/2017. Patient noted to have low CBG of 54 on 02/20/2017, just prior to lunch. Patient states  has been very compliant with his medications. As noted on med rec that patient take 70/30  60 units at bedtime. Patient was placed on NPH 30 units daily however CBGs have been low. Discontinued long-acting insulin and meal coverage insulin. Sliding scale insulin and monitor.  #3 history of alcohol abuse No signs or symptoms of withdrawal. Stable. Continue Ativan withdrawal protocol. Continue thiamine and folic acid and multivitamin.  #4 severe protein calorie malnutrition Continue Glucerna.  #5 history of alcohol-induced chronic pancreatitis Stable. Continue Creon.   DVT prophylaxis: Lovenox Code Status: Full Family Communication: Updated patient. No family at bedside. Disposition Plan: Pending evaluation and management per podiatry.   Consultants:   Podiatry  Dr. Logan Bores 02/18/2017  Procedures:   None  Antimicrobials:   IV Zosyn 02/16/2017  IV vancomycin 02/16/2017   Subjective: Patient sitting up at bedside. No chest pain. No shortness of breath.   Objective: Vitals:   02/17/17 0356 02/17/17 1300 02/17/17 2110 02/18/17 0433  BP: 132/83 (!) 127/95 (!) 144/98 (!) 145/105  Pulse: 83 82 74 80  Resp: 16 16 16 16   Temp: 98.6 F (37 C) 98.5 F (36.9 C) 98.5 F (36.9 C) 98.6 F (37 C)  TempSrc: Oral Oral Oral Oral  SpO2: 100% 100% 96% 100%  Weight:      Height:        Intake/Output Summary (Last 24 hours) at 02/18/17 1624 Last data filed at 02/18/17 1000  Gross per 24 hour  Intake              970 ml  Output             1900 ml  Net             -  930 ml   Filed Weights   02/16/17 0017 02/16/17 0244  Weight: 66.5 kg (146 lb 9.7 oz) 66.5 kg (146 lb 9.7 oz)    Examination:  General exam: Appears calm and comfortable  Respiratory system: Clear to auscultation. Respiratory effort normal. Cardiovascular system: S1 & S2 heard, RRR. No JVD, murmurs, rubs, gallops or clicks. No pedal edema. Gastrointestinal system: Abdomen is nondistended, soft and nontender. No  organomegaly or masses felt. Normal bowel sounds heard. Central nervous system: Alert and oriented. No focal neurological deficits. Extremities: Left foot wrapped in bandage.  Skin: No rashes, lesions or ulcers Psychiatry: Judgement and insight appear normal. Mood & affect appropriate.     Data Reviewed: I have personally reviewed following labs and imaging studies  CBC:  Recent Labs Lab 02/15/17 1829 02/16/17 0753 02/17/17 0447 02/18/17 0412  WBC 8.3 8.9 9.3 7.8  NEUTROABS 4.9 4.6 3.9  --   HGB 13.1 13.8 14.0 13.4  HCT 38.8* 39.5 41.3 39.7  MCV 98.5 98.8 99.0 99.7  PLT 285 297 343 324   Basic Metabolic Panel:  Recent Labs Lab 02/15/17 1829 02/16/17 0753 02/17/17 0447 02/18/17 0412  NA 130* 132* 133* 132*  K 4.0 3.5 3.3* 4.3  CL 98* 99* 97* 101  CO2 21* 22 24 25   GLUCOSE 376* 236* 61* 133*  BUN <5* 6 6 6   CREATININE 0.85 0.74 0.73 0.67  CALCIUM 9.1 9.2 9.5 9.4  MG  --  1.8 2.1  --    GFR: Estimated Creatinine Clearance: 108.5 mL/min (by C-G formula based on SCr of 0.67 mg/dL). Liver Function Tests:  Recent Labs Lab 02/15/17 1829  AST 29  ALT 21  ALKPHOS 209*  BILITOT 0.5  PROT 7.8  ALBUMIN 2.9*   No results for input(s): LIPASE, AMYLASE in the last 168 hours. No results for input(s): AMMONIA in the last 168 hours. Coagulation Profile: No results for input(s): INR, PROTIME in the last 168 hours. Cardiac Enzymes: No results for input(s): CKTOTAL, CKMB, CKMBINDEX, TROPONINI in the last 168 hours. BNP (last 3 results) No results for input(s): PROBNP in the last 8760 hours. HbA1C:  Recent Labs  02/16/17 0753  HGBA1C 12.4*   CBG:  Recent Labs Lab 02/17/17 1645 02/17/17 1845 02/17/17 2108 02/18/17 0601 02/18/17 1131  GLUCAP 53* 240* 194* 114* 282*   Lipid Profile: No results for input(s): CHOL, HDL, LDLCALC, TRIG, CHOLHDL, LDLDIRECT in the last 72 hours. Thyroid Function Tests: No results for input(s): TSH, T4TOTAL, FREET4, T3FREE,  THYROIDAB in the last 72 hours. Anemia Panel: No results for input(s): VITAMINB12, FOLATE, FERRITIN, TIBC, IRON, RETICCTPCT in the last 72 hours. Sepsis Labs:  Recent Labs Lab 02/15/17 1849 02/16/17 0055 02/16/17 0109  LATICACIDVEN 1.99* 1.41 1.63    Recent Results (from the past 240 hour(s))  Blood culture (routine x 2)     Status: None (Preliminary result)   Collection Time: 02/16/17 12:30 AM  Result Value Ref Range Status   Specimen Description BLOOD LEFT ANTECUBITAL  Final   Special Requests   Final    BOTTLES DRAWN AEROBIC AND ANAEROBIC Blood Culture adequate volume   Culture NO GROWTH 2 DAYS  Final   Report Status PENDING  Incomplete  Blood culture (routine x 2)     Status: None (Preliminary result)   Collection Time: 02/16/17 12:35 AM  Result Value Ref Range Status   Specimen Description BLOOD BLOOD RIGHT FOREARM  Final   Special Requests   Final    BOTTLES DRAWN  AEROBIC AND ANAEROBIC Blood Culture adequate volume   Culture NO GROWTH 2 DAYS  Final   Report Status PENDING  Incomplete  Surgical pcr screen     Status: None   Collection Time: 02/16/17  3:03 AM  Result Value Ref Range Status   MRSA, PCR NEGATIVE NEGATIVE Final   Staphylococcus aureus NEGATIVE NEGATIVE Final    Comment:        The Xpert SA Assay (FDA approved for NASAL specimens in patients over 71 years of age), is one component of a comprehensive surveillance program.  Test performance has been validated by [facility] for patients greater than or equal to 24 year old. It is not intended to diagnose infection nor to guide or monitor treatment.   Aerobic Culture (superficial specimen)     Status: None (Preliminary result)   Collection Time: 02/17/17  4:48 PM  Result Value Ref Range Status   Specimen Description FOOT LEFT  Final   Special Requests NONE  Final   Gram Stain   Final    MODERATE WBC PRESENT, PREDOMINANTLY PMN FEW GRAM POSITIVE COCCI IN PAIRS IN CHAINS    Culture CULTURE  REINCUBATED FOR BETTER GROWTH  Final   Report Status PENDING  Incomplete         Radiology Studies: No results found.      Scheduled Meds: . chlorhexidine  60 mL Topical Once  . enoxaparin (LOVENOX) injection  40 mg Subcutaneous Q24H  . feeding supplement (GLUCERNA SHAKE)  237 mL Oral TID BM  . folic acid  1 mg Oral Daily  . insulin aspart  0-15 Units Subcutaneous TID WC  . lipase/protease/amylase  36,000 Units Oral TID WC  . multivitamin with minerals  1 tablet Oral Daily  . thiamine  100 mg Oral Daily   Or  . thiamine  100 mg Intravenous Daily   Continuous Infusions: . piperacillin-tazobactam (ZOSYN)  IV 3.375 g (02/18/17 1451)  . vancomycin Stopped (02/18/17 1209)     LOS: 2 days    Time spent: 35 minutes    THOMPSON,DANIEL, MD [facility] Pager [phone]  If 7PM-7AM, please contact night-coverage [email or web address] Password [facility] 02/18/2017, 4:24 PM

## 2017-02-18 NOTE — Progress Notes (Addendum)
Inpatient Diabetes Program Recommendations  AACE/ADA: New Consensus Statement on Inpatient Glycemic Control (2015)  Target Ranges:  Prepandial:   less than 140 mg/dL      Peak postprandial:   less than 180 mg/dL (1-2 hours)      Critically ill patients:  140 - 180 mg/dL   Lab Results  Component Value Date   GLUCAP 114 (H) 02/18/2017   HGBA1C 12.4 (H) 02/16/2017   Spoke with patient about diabetes and home regimen for diabetes control. Patient reports that he is followed by Dr. Fransico HimNida, Endocrin[place], in [place], [place] for [place] control and has been seeing him for the past year.  Patient reports insulin increases at last visit 3 months ago but does not recall Dr. Fransico HimNida telling him his A1c level. Patient was aware of his A1c level while here >12%. Patient reports checking his glucose 3-4 times a day and reports following a diabetic diet. Discussed importance of checking CBGs and maintaining good CBG control to prevent long-term and short-term complications. Explained how hyperglycemia leads to damage within blood vessels which lead to the common complications seen with uncontrolled diabetes. Stressed to the patient the importance of improving glycemic control to prevent further complications from uncontrolled diabetes. Discussed impact of nutrition, exercise, stress, sickness, and medications on diabetes control. Spoke with patient about calling Dr. Fransico HimNida every 3-4 days for insulin adjustments if glucose >200. Patient reports his next appointment with Dr. Fransico HimNida was for tomorrow 6/5, patient will cancel appointment and inform the office the reason for cancellation.  Patient verbalized understanding of information discussed and has no further questions at this time related to diabetes.  Consider NPH 15 units BID while inpatient. Glucose trends in the high 200's.  Thanks,  Christena DeemShannon Bonnie Overdorf RN, MSN, [facility] Inpatient Diabetes Coordinator Team Pager [phone] (8a-5p)

## 2017-02-18 NOTE — Anesthesia Procedure Notes (Signed)
Anesthesia Regional Block: Popliteal block   Pre-Anesthetic Checklist: ,, timeout performed, Correct Patient, Correct Site, Correct Laterality, Correct Procedure, Correct Position, site marked, Risks and benefits discussed,  Surgical consent,  Pre-op evaluation,  At surgeon's request and post-op pain management  Laterality: Lower and Left  Prep: chloraprep       Needles:  Injection technique: Single-shot  Needle Type: Echogenic Stimulator Needle          Additional Needles:   Procedures: ultrasound guided,,,,,,,,  Narrative:  Start time: 02/18/2017 5:34 PM End time: 02/18/2017 5:45 PM Injection made incrementally with aspirations every 5 mL.  Performed by: Personally  Anesthesiologist: Cap Massi  Additional Notes: H+P and labs reviewed, risks and benefits discussed with patient, procedure tolerated well without complications

## 2017-02-18 NOTE — Anesthesia Preprocedure Evaluation (Signed)
Anesthesia Evaluation  Patient identified by MRN, date of birth, ID band Patient awake    Reviewed: Allergy & Precautions, NPO status , Patient's Chart, lab work & pertinent test results  History of Anesthesia Complications Negative for: history of anesthetic complications  Airway Mallampati: II  TM Distance: >3 FB Neck ROM: Full    Dental  (+) Teeth Intact   Pulmonary neg shortness of breath, neg COPD, neg recent URI, Current Smoker,    breath sounds clear to auscultation       Cardiovascular hypertension,  Rhythm:Regular Rate:Normal     Neuro/Psych PSYCHIATRIC DISORDERS negative neurological ROS     GI/Hepatic GERD (occasional, no meds per patient)  ,  Endo/Other  diabetes, Type 1, Insulin Dependent  Renal/GU      Musculoskeletal   Abdominal   Peds  Hematology   Anesthesia Other Findings   Reproductive/Obstetrics                             Anesthesia Physical Anesthesia Plan  ASA: II  Anesthesia Plan: MAC and Regional   Post-op Pain Management:    Induction:   PONV Risk Score and Plan: 0 and Ondansetron and Propofol  Airway Management Planned: Natural Airway, Nasal Cannula and Simple Face Mask  Additional Equipment: None  Intra-op Plan:   Post-operative Plan:   Informed Consent: I have reviewed the patients History and Physical, chart, labs and discussed the procedure including the risks, benefits and alternatives for the proposed anesthesia with the patient or authorized representative who has indicated his/her understanding and acceptance.   Dental advisory given  Plan Discussed with: CRNA and Surgeon  Anesthesia Plan Comments:         Anesthesia Quick Evaluation

## 2017-02-18 NOTE — Brief Op Note (Signed)
02/15/2017 - 02/18/2017  7:46 PM  PATIENT:  [name]  46 y.o. male  PRE-OPERATIVE DIAGNOSIS:  Foot wound  POST-OPERATIVE DIAGNOSIS:  Foot wound  PROCEDURE:  Procedure(s): PARTIAL FIRST RAY AMPUTATION LEFT FOOT. INCISION AND DRAINAGE LEFT FOOT. (Left)  SURGEON:  Surgeon(s) and Role:    Felecia Shelling* Evans, Brent M, DPM - Primary  PHYSICIAN ASSISTANT:   ASSISTANTS: none   ANESTHESIA:   regional  EBL:  No intake/output data recorded.  BLOOD ADMINISTERED:none  DRAINS: none   LOCAL MEDICATIONS USED:  MARCAINE    and LIDOCAINE   SPECIMEN:  Source of Specimen:  Deep wound culture left foot. First metatarsal left foot  DISPOSITION OF SPECIMEN:  PATHOLOGY  COUNTS:  YES  TOURNIQUET:   Total Tourniquet Time Documented: Calf (Left) - 33 minutes Total: Calf (Left) - 33 minutes   DICTATION: .Reubin Milanragon Dictation  PLAN OF CARE: Admit to inpatient   PATIENT DISPOSITION:  PACU - hemodynamically stable.   Delay start of Pharmacological VTE agent (>24hrs) due to surgical blood loss or risk of bleeding: not applicable  Felecia ShellingBrent M. Evans, DPM [facility]  Dr. Felecia ShellingBrent M. Evans, DPM    [address]                                        [place], [place] [address]                Office [phone]  Fax [phone]

## 2017-02-18 NOTE — Progress Notes (Signed)
[facility]  [facility] Coordinator will follow pt for possible Home Infusion needs at DC once evaluated by ID team.   If patient discharges after hours, please call [phone].   Sedalia Mutaamela S Chandler 02/18/2017, 4:48 PM

## 2017-02-18 NOTE — Consult Note (Signed)
WOC consulted for foot ulceration, reviewed chart patient is followed by podietry and they have provided guidance for care.  Patient will go to the OR for amputation today.   Re consult if needed, will not follow at this time. Thanks  Melida Northington M.D.C. [employer] MSN, RN,CWOCN, CNS [phone])

## 2017-02-18 NOTE — Care Management Note (Signed)
Case Management Note  Patient Details  Name: [name] MRN: [id number] Date of Birth: [birth year]  Subjective/Objective:    Osteomyelitis of left foot, ulcer left foot secondary to DM, Cellulitis of left foot                Action/Plan: Discharge Planning: NCM [facility] to pt and [facility]. Lives at home with brother [name] [phone]. Pt states he is ambulatory without DME. Will have surgery today, partial second ray amp. Contacted [facility] to follow up on resumption of care.  Will resumption of care HH orders.    PCP [facility]   Expected Discharge Date:                Expected Discharge Plan:  Home w Home Health Services  In-House Referral:  Clinical Social Work  Discharge planning Services  CM Consult  Post Acute Care Choice:  Home Health, Resumption of Svcs/PTA Provider Choice offered to:  Patient  DME Arranged:  N/A DME Agency:  NA  HH Arranged:  RN, PT HH Agency:  [facility]  Status of Service:  In process, will continue to follow  If discussed at Long Length of Stay Meetings, dates discussed:    Additional Comments:  Elliot CousinShavis, Azusena Erlandson Ellen, RN 02/18/2017, 12:09 PM

## 2017-02-18 NOTE — Transfer of Care (Signed)
Immediate Anesthesia Transfer of Care Note  Patient: [name]  Procedure(s) Performed: Procedure(s): PARTIAL FIRST RAY AMPUTATION LEFT FOOT. INCISION AND DRAINAGE LEFT FOOT. (Left)  Patient Location: PACU  Anesthesia Type:Regional  Level of Consciousness: awake, alert  and oriented  Airway & Oxygen Therapy: Patient Spontanous Breathing and Patient connected to nasal cannula oxygen  Post-op Assessment: Report given to RN and Post -op Vital signs reviewed and stable  Post vital signs: Reviewed and stable  Last Vitals:  Vitals:   02/17/17 2110 02/18/17 0433  BP: (!) 144/98 (!) 145/105  Pulse: 74 80  Resp: 16 16  Temp: 36.9 C 37 C    Last Pain:  Vitals:   02/18/17 1218  TempSrc:   PainSc: 5       Patients Stated Pain Goal: 3 (02/16/17 0800)  Complications: No apparent anesthesia complications

## 2017-02-18 NOTE — Care Management Important Message (Signed)
Important Message  Patient Details  Name: [name] MRN: [id number] Date of Birth: Apr 28, 1971   Medicare Important Message Given:  Yes (signed copy)    Elliot CousinShavis, Mirage Pfefferkorn Ellen, RN 02/18/2017, 12:07 PM

## 2017-02-18 NOTE — H&P (View-Only) (Signed)
   HPI: 45-year-old male with a history of diabetes mellitus and ulcers to the lower extremities with a history of infection presents as an inpatient for evaluation of cellulitis with abscess and osteomyelitis to the left foot secondary to diabetes mellitus. Patient initially presented to the emergency department on Friday, 02/15/2017 when he was admitted and podiatry consult.     Physical Exam: General: The patient is alert and oriented x3 in no acute distress.  Dermatology: Ulcer noted to the sub-first MPJ of the left foot and first interdigital webspace. The ulcer probes to bone. There is purulent drainage noted with a strong malodor. Periwound integrity is macerated. There is exposed bone muscle-tendon ligament and joint.  Vascular: Palpable pedal pulses bilaterally.   Neurological: Epicritic and protective threshold absent bilateral  Musculoskeletal Exam: Range of motion within normal limits to all pedal and ankle joints bilateral. Muscle strength 5/5 in all groups bilateral.  Previous MRI taken on 02/06/2017 was consistent with clinical findings of osteomyelitis to the first metatarsal phalangeal joint.  Assessment: 1. Osteomyelitis left foot 2. Ulcer left foot secondary to diabetes mellitus 3. Cellulitis left foot  Plan of Care:  1. Patient was evaluated. 2. Patient understands that he has an acute infection with osteomyelitis of the first MPJ left foot. Patient will require partial first ray amputation left foot. Patient understands this is a salvage procedure. Patient may also require partial second ray amputation as well. All possible complications and details the procedure were explained. All patient questions were answered. No guarantees were expressed or implied. 3. Patient scheduled for surgical intervention today after 5 PM. Nothing by mouth after breakfast 4.  patient will also likely require long-term IV antibiotics 5. Return to clinic within one week after discharge     Brent M. Evans, DPM [facility]  Dr. Brent M. Evans, DPM    [address]                                        [place], [place] [address]                Office [phone]  Fax [phone]      

## 2017-02-18 NOTE — Progress Notes (Signed)
As per Dr Logan BoresEvans, Pt"s left foot has been redressed with gauze, ABD, and ace wrap. Left big toe dry, clean, no purulent drainage, no redness, odor present.

## 2017-02-19 ENCOUNTER — Encounter (HOSPITAL_COMMUNITY): Payer: Self-pay | Admitting: Podiatry

## 2017-02-19 ENCOUNTER — Ambulatory Visit: Payer: Medicare Other | Admitting: "Endocrinology

## 2017-02-19 ENCOUNTER — Inpatient Hospital Stay (HOSPITAL_COMMUNITY): Payer: Medicare Other

## 2017-02-19 DIAGNOSIS — I739 Peripheral vascular disease, unspecified: Secondary | ICD-10-CM

## 2017-02-19 DIAGNOSIS — B9689 Other specified bacterial agents as the cause of diseases classified elsewhere: Secondary | ICD-10-CM

## 2017-02-19 DIAGNOSIS — F17210 Nicotine dependence, cigarettes, uncomplicated: Secondary | ICD-10-CM

## 2017-02-19 DIAGNOSIS — Z89412 Acquired absence of left great toe: Secondary | ICD-10-CM

## 2017-02-19 DIAGNOSIS — E1169 Type 2 diabetes mellitus with other specified complication: Secondary | ICD-10-CM

## 2017-02-19 DIAGNOSIS — Z801 Family history of malignant neoplasm of trachea, bronchus and lung: Secondary | ICD-10-CM

## 2017-02-19 DIAGNOSIS — Z833 Family history of diabetes mellitus: Secondary | ICD-10-CM

## 2017-02-19 DIAGNOSIS — Z794 Long term (current) use of insulin: Secondary | ICD-10-CM

## 2017-02-19 LAB — VANCOMYCIN, TROUGH: Vancomycin Tr: 20 ug/mL (ref 15–20)

## 2017-02-19 LAB — GLUCOSE, CAPILLARY
GLUCOSE-CAPILLARY: 205 mg/dL — AB (ref 65–99)
Glucose-Capillary: 252 mg/dL — ABNORMAL HIGH (ref 65–99)
Glucose-Capillary: 256 mg/dL — ABNORMAL HIGH (ref 65–99)
Glucose-Capillary: 165 mg/dL — ABNORMAL HIGH (ref 65–99)

## 2017-02-19 LAB — CBC WITH DIFFERENTIAL/PLATELET
BASOS PCT: 1 %
Basophils Absolute: 0 10*3/uL (ref 0.0–0.1)
EOS ABS: 0.2 10*3/uL (ref 0.0–0.7)
Eosinophils Relative: 2 %
HCT: 36.5 % — ABNORMAL LOW (ref 39.0–52.0)
HEMOGLOBIN: 12.1 g/dL — AB (ref 13.0–17.0)
Lymphocytes Relative: 24 %
Lymphs Abs: 2 10*3/uL (ref 0.7–4.0)
MCH: 33.5 pg (ref 26.0–34.0)
MCHC: 33.2 g/dL (ref 30.0–36.0)
MCV: 101.1 fL — ABNORMAL HIGH (ref 78.0–100.0)
MONO ABS: 0.7 10*3/uL (ref 0.1–1.0)
MONOS PCT: 9 %
NEUTROS PCT: 64 %
Neutro Abs: 5.3 10*3/uL (ref 1.7–7.7)
Platelets: 311 10*3/uL (ref 150–400)
RBC: 3.61 MIL/uL — ABNORMAL LOW (ref 4.22–5.81)
RDW: 13.4 % (ref 11.5–15.5)
WBC: 8.2 10*3/uL (ref 4.0–10.5)

## 2017-02-19 LAB — BASIC METABOLIC PANEL
Anion gap: 7 (ref 5–15)
BUN: <5 mg/dL — ABNORMAL LOW (ref 6–20)
CALCIUM: 8.8 mg/dL — AB (ref 8.9–10.3)
CO2: 23 mmol/L (ref 22–32)
CREATININE: 0.7 mg/dL (ref 0.61–1.24)
Chloride: 100 mmol/L — ABNORMAL LOW (ref 101–111)
GFR calc Af Amer: >60 mL/min (ref >60)
GFR calc non Af Amer: >60 mL/min (ref >60)
Glucose, Bld: 270 mg/dL — ABNORMAL HIGH (ref 65–99)
Potassium: 4.1 mmol/L (ref 3.5–5.1)
SODIUM: 130 mmol/L — AB (ref 135–145)

## 2017-02-19 MED ORDER — DEXTROSE 5 % IV SOLN
2.0000 g | INTRAVENOUS | Status: DC
  Administered 2017-02-19: 2 g via INTRAVENOUS
  Filled 2017-02-19 (×2): qty 2

## 2017-02-19 MED ORDER — INSULIN ASPART PROT & ASPART (70-30 MIX) 100 UNIT/ML ~~LOC~~ SUSP
10.0000 [IU] | Freq: Two times a day (BID) | SUBCUTANEOUS | Status: DC
  Administered 2017-02-19 – 2017-02-20 (×3): 10 [IU] via SUBCUTANEOUS
  Filled 2017-02-19: qty 10

## 2017-02-19 NOTE — Consult Note (Signed)
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 male that was admitted to the hospital on 02/15/2017 with a diabetic foot ulcer. He has had an ulcer / gangrene of the left forefoot and 1st toe for over 3 months now. He has attempted multiple rounds of antibiotics (Bactrim DS most recent Rx) and wound care with podiatry team at [facility] with Dr. Amalia Hailey. He had a MRI of the foot on May 23rd showing concern for septic arthritis/effusion surrounding first MTP joint. FU OV on 5/29 showed rapid advancement of wound and Dr. Amalia Hailey asked patient to present to the ED for IV antibiotics and scheduled partial first ray amputation with I&D of left foot. He underwent this procedure 02/18/17.   He is POD 1 this morning. Reports he is feeling good. Pain is controlled. Staff just re-dressed his foot. Denies any fevers, chills, headaches, n/v, appetite disturbances, SOB or chest pain.   Past Medical History:  Diagnosis Date  . Alcoholic ([place])   . Chronic pancreatitis ([place])   . Diabetes mellitus   . GERD (gastroesophageal reflux disease)   . HTN (hypertension)     Allergies: No Known Allergies  Current antibiotics: Anti-infectives    Start     Dose/Rate Route Frequency Ordered Stop   02/19/17 1430  cefTRIAXone (ROCEPHIN) 2 g in dextrose 5 % 50 mL IVPB     2 g 100 mL/hr over 30 Minutes Intravenous Every 24 hours 02/19/17 1427     02/16/17  1000  vancomycin (VANCOCIN) IVPB 1000 mg/200 mL premix  Status:  Discontinued     1,000 mg 200 mL/hr over 60 Minutes Intravenous Every 8 hours 02/16/17 0238 02/19/17 1427   02/16/17 1000  piperacillin-tazobactam (ZOSYN) IVPB 3.375 g  Status:  Discontinued     3.375 g 12.5 mL/hr over 240 Minutes Intravenous Every 8 hours 02/16/17 0238 02/19/17 1427   02/16/17 0030  vancomycin (VANCOCIN) IVPB 1000 mg/200 mL premix     1,000 mg 200 mL/hr over 60 Minutes Intravenous STAT 02/16/17 0021 02/16/17 0206   02/16/17 0030  piperacillin-tazobactam (ZOSYN) IVPB 3.375 g     3.375 g 100 mL/hr over 30 Minutes Intravenous STAT 02/16/17 0021 02/16/17 0211      MEDICATIONS: . enoxaparin (LOVENOX) injection  40 mg Subcutaneous Q24H  . feeding supplement (GLUCERNA SHAKE)  237 mL Oral TID BM  . folic acid  1 mg Oral Daily  . insulin aspart  0-15 Units Subcutaneous TID WC  . insulin aspart protamine- aspart  10 Units Subcutaneous BID WC  . lipase/protease/amylase  36,000 Units Oral TID WC  . multivitamin with minerals  1 tablet Oral Daily  . sodium chloride flush  3 mL Intravenous Q12H  . thiamine  100 mg Oral Daily   Or  . thiamine  100 mg  Intravenous Daily    Social History  Substance Use Topics  . Smoking status: Current Every Day Smoker    Packs/day: 0.50    Years: 15.00  . Smokeless tobacco: Never Used  . Alcohol use Yes     Comment: beers daily 2-4    Family History  Problem Relation Age of Onset  . Diabetes Mother   . Cancer - Lung Mother 68       non-smoker  . Cancer Mother        lung  . Colon cancer Neg Hx     Review of Systems - Negative except for what is described in HPI   OBJECTIVE: Temp:  [97.6 F (36.4 C)-99.1 F (37.3 C)] 99.1 F (37.3 C) (06/05 0619) Pulse Rate:  [58-84] 84 (06/05 0619) Resp:  [12-21] 17 (06/05 0619) BP: (109-161)/(76-112) 119/76 (06/05 0619) SpO2:  [97 %-100 %] 97 % (06/05 0619)   General appearance: alert, cooperative and no distress Eyes:  conjunctivae/corneas clear. PERRL, EOM's intact. Fundi benign. Resp: clear to auscultation bilaterally and normal effort and rate Cardio: regular rate and rhythm, S1, S2 normal, no murmur, click, rub or gallop GI: soft, non-tender; bowel sounds normal; no masses,  no organomegaly Extremities: extremities normal, atraumatic, no cyanosis or edema Pulses: 2+ bilateral lower extremities, warm to the touch  Skin: Skin color, texture, turgor normal. No rashes or lesions Lymph nodes: Cervical, supraclavicular, and axillary nodes normal. Incision/Wound: Left foot - wrapped with ACE and kerlix. Small bloody drainage noted.   LABS: Lab Results  Component Value Date   HGBA1C 12.4 (H) 02/16/2017    Results for orders placed or performed during the hospital encounter of 02/15/17 (from the past 48 hour(s))  Glucose, capillary     Status: Abnormal   Collection Time: 02/17/17  4:45 PM  Result Value Ref Range   Glucose-Capillary 53 (L) 65 - 99 mg/dL  Aerobic Culture (superficial specimen)     Status: None (Preliminary result)   Collection Time: 02/17/17  4:48 PM  Result Value Ref Range   Specimen Description FOOT LEFT    Special Requests NONE    Gram Stain      MODERATE WBC PRESENT, PREDOMINANTLY PMN FEW GRAM POSITIVE COCCI IN PAIRS IN CHAINS    Culture CULTURE REINCUBATED FOR BETTER GROWTH    Report Status PENDING   Glucose, capillary     Status: Abnormal   Collection Time: 02/17/17  6:45 PM  Result Value Ref Range   Glucose-Capillary 240 (H) 65 - 99 mg/dL  Glucose, capillary     Status: Abnormal   Collection Time: 02/17/17  9:08 PM  Result Value Ref Range   Glucose-Capillary 194 (H) 65 - 99 mg/dL  CBC     Status: Abnormal   Collection Time: 02/18/17  4:12 AM  Result Value Ref Range   WBC 7.8 4.0 - 10.5 K/uL   RBC 3.98 (L) 4.22 - 5.81 MIL/uL   Hemoglobin 13.4 13.0 - 17.0 g/dL   HCT 39.7 39.0 - 52.0 %   MCV 99.7 78.0 - 100.0 fL   MCH 33.7 26.0 - 34.0 pg   MCHC 33.8 30.0 - 36.0 g/dL     RDW 13.1 11.5 - 15.5 %   Platelets 324 150 - 400 K/uL  Basic metabolic panel     Status: Abnormal   Collection Time: 02/18/17  4:12 AM  Result Value Ref Range   Sodium 132 (L) 135 - 145 mmol/L   Potassium 4.3 3.5 - 5.1 mmol/L  Comment: NO VISIBLE HEMOLYSIS   Chloride 101 101 - 111 mmol/L   CO2 25 22 - 32 mmol/L   Glucose, Bld 133 (H) 65 - 99 mg/dL   BUN 6 6 - 20 mg/dL   Creatinine, Ser 0.67 0.61 - 1.24 mg/dL   Calcium 9.4 8.9 - 10.3 mg/dL   GFR calc non Af Amer >60 >60 mL/min   GFR calc Af Amer >60 >60 mL/min    Comment: (NOTE) The eGFR has been calculated using the CKD EPI equation. This calculation has not been validated in all clinical situations. eGFR's persistently <60 mL/min signify possible Chronic Kidney Disease.    Anion gap 6 5 - 15  Glucose, capillary     Status: Abnormal   Collection Time: 02/18/17  6:01 AM  Result Value Ref Range   Glucose-Capillary 114 (H) 65 - 99 mg/dL  Glucose, capillary     Status: Abnormal   Collection Time: 02/18/17 11:31 AM  Result Value Ref Range   Glucose-Capillary 282 (H) 65 - 99 mg/dL  Glucose, capillary     Status: None   Collection Time: 02/18/17  4:47 PM  Result Value Ref Range   Glucose-Capillary 69 65 - 99 mg/dL  Glucose, capillary     Status: Abnormal   Collection Time: 02/18/17  5:25 PM  Result Value Ref Range   Glucose-Capillary 124 (H) 65 - 99 mg/dL  Aerobic/Anaerobic Culture (surgical/deep wound)     Status: None (Preliminary result)   Collection Time: 02/18/17  6:22 PM  Result Value Ref Range   Specimen Description WOUND LEFT TOE    Special Requests NONE    Gram Stain      RARE WBC PRESENT,BOTH PMN AND MONONUCLEAR NO ORGANISMS SEEN    Culture PENDING    Report Status PENDING   Glucose, capillary     Status: Abnormal   Collection Time: 02/18/17  7:05 PM  Result Value Ref Range   Glucose-Capillary 170 (H) 65 - 99 mg/dL  Glucose, capillary     Status: Abnormal   Collection Time: 02/18/17  9:17 PM  Result  Value Ref Range   Glucose-Capillary 242 (H) 65 - 99 mg/dL  Glucose, capillary     Status: Abnormal   Collection Time: 02/19/17  6:43 AM  Result Value Ref Range   Glucose-Capillary 252 (H) 65 - 99 mg/dL  CBC with Differential/Platelet     Status: Abnormal   Collection Time: 02/19/17  7:08 AM  Result Value Ref Range   WBC 8.2 4.0 - 10.5 K/uL   RBC 3.61 (L) 4.22 - 5.81 MIL/uL   Hemoglobin 12.1 (L) 13.0 - 17.0 g/dL   HCT 36.5 (L) 39.0 - 52.0 %   MCV 101.1 (H) 78.0 - 100.0 fL   MCH 33.5 26.0 - 34.0 pg   MCHC 33.2 30.0 - 36.0 g/dL   RDW 13.4 11.5 - 15.5 %   Platelets 311 150 - 400 K/uL   Neutrophils Relative % 64 %   Neutro Abs 5.3 1.7 - 7.7 K/uL   Lymphocytes Relative 24 %   Lymphs Abs 2.0 0.7 - 4.0 K/uL   Monocytes Relative 9 %   Monocytes Absolute 0.7 0.1 - 1.0 K/uL   Eosinophils Relative 2 %   Eosinophils Absolute 0.2 0.0 - 0.7 K/uL   Basophils Relative 1 %   Basophils Absolute 0.0 0.0 - 0.1 K/uL  Basic metabolic panel     Status: Abnormal   Collection Time: 02/19/17  7:08 AM  Result Value Ref  Range   Sodium 130 (L) 135 - 145 mmol/L   Potassium 4.1 3.5 - 5.1 mmol/L   Chloride 100 (L) 101 - 111 mmol/L   CO2 23 22 - 32 mmol/L   Glucose, Bld 270 (H) 65 - 99 mg/dL   BUN <5 (L) 6 - 20 mg/dL   Creatinine, Ser 0.70 0.61 - 1.24 mg/dL   Calcium 8.8 (L) 8.9 - 10.3 mg/dL   GFR calc non Af Amer >60 >60 mL/min   GFR calc Af Amer >60 >60 mL/min    Comment: (NOTE) The eGFR has been calculated using the CKD EPI equation. This calculation has not been validated in all clinical situations. eGFR's persistently <60 mL/min signify possible Chronic Kidney Disease.    Anion gap 7 5 - 15  Glucose, capillary     Status: Abnormal   Collection Time: 02/19/17 12:07 PM  Result Value Ref Range   Glucose-Capillary 256 (H) 65 - 99 mg/dL    MICRO: - BCx 6/1 >> NGTD - Tissue Cx 6/4 >> GPC pairs / chains, reincubated   IMAGING: No results found.  HISTORICAL MICRO/IMAGING  Assessment/Plan:   46yo male with uncontrolled diabetes and chronic foot ulcer s/p partial first ray amputation by Dr. Amalia Hailey 6/4.   Left Diabetic Foot Ulcer / Osteomyelitis = -  Per Dr. Phoebe Perch op note - there was more proximal necrotic bone noted to the head and distal one half of the first metatarsal. The head of the first metatarsal at as well as the sesamoidal apparatus was removed.  - Preliminary GPC pairs/chains noted, await ID - Will narrow to Ceftriaxone for now until cultures result - With necrosed bone/tissue removed likely can be discharged on Amox-Clav for a 10 day course - No need for IV antibiotics at this time  Janene Madeira, MSN, NP-C [facility] for [place]: [phone] Pager: [phone]  02/19/2017 2:27 PM

## 2017-02-19 NOTE — Evaluation (Signed)
Physical Therapy Evaluation Patient Details Name: [name] MRN: [id number] DOB: 11-07-1970 Today's Date: 02/19/2017   History of Present Illness  46 yo admitted with left foot osteo s/p partial first ray amputation with I&D. PMHx: DM, ETOH abuse, pancreatitis  Clinical Impression  Pt pleasant and moving well with noted left foot drop post op. Pt reports numbness of foot and lower leg which has not returned since surgery. Pt with decreased strength, function and gait who will benefit from acute therapy to maximize mobility, gait and strength to return pt to independent function. If sensation and strength of LLE do not return acutely OPPT for strengthening and bracing recommended.     Follow Up Recommendations Outpatient PT    Equipment Recommendations  Rolling walker with 5" wheels    Recommendations for Other Services       Precautions / Restrictions Precautions Precautions: Fall Precaution Comments: left foot drop post op Required Braces or Orthoses: Other Brace/Splint Other Brace/Splint: post op shoe left Restrictions Weight Bearing Restrictions: No      Mobility  Bed Mobility Overal bed mobility: Modified Independent                Transfers Overall transfer level: Modified independent                  Ambulation/Gait Ambulation/Gait assistance: Supervision Ambulation Distance (Feet): 200 Feet Assistive device: Rolling walker (2 wheeled) Gait Pattern/deviations: Step-through pattern;Decreased stride length;Steppage;Decreased dorsiflexion - left   Gait velocity interpretation: Below normal speed for age/gender General Gait Details: pt with foot drop post op and utilizing steppage gait on LLE to clear foot. Cues for sequence, safety and position in RW. Pt with slow and steady gait to attend to impaired left foot placement  Stairs Stairs: Yes Stairs assistance: Min assist Stair Management: Step to pattern;Backwards;With walker Number of Stairs:  2 General stair comments: cues for sequence with assist for RW stability, Handout provided  Wheelchair Mobility    Modified Rankin (Stroke Patients Only)       Balance Overall balance assessment: Needs assistance   Sitting balance-Leahy Scale: Normal       Standing balance-Leahy Scale: Fair                               Pertinent Vitals/Pain Pain Assessment: No/denies pain    Home Living Family/patient expects to be discharged to:: Private residence Living Arrangements: Other relatives Available Help at Discharge: Family;Available 24 hours/day Type of Home: House Home Access: Stairs to enter   [employer] of Steps: 3 Home Layout: Laundry or work area in basement;Two level;Able to live on main level with bedroom/bathroom Home Equipment: Crutches      Prior Function Level of Independence: Independent               Hand Dominance        Extremity/Trunk Assessment   Upper Extremity Assessment Upper Extremity Assessment: Overall WFL for tasks assessed    Lower Extremity Assessment Lower Extremity Assessment: Overall WFL for tasks assessed    Cervical / Trunk Assessment Cervical / Trunk Assessment: Normal  Communication   Communication: No difficulties  Cognition Arousal/Alertness: Awake/alert Behavior During Therapy: WFL for tasks assessed/performed Overall Cognitive Status: Within Functional Limits for tasks assessed  General Comments      Exercises     Assessment/Plan    PT Assessment Patient needs continued PT services  PT Problem List Decreased strength;Decreased balance;Decreased knowledge of use of DME;Decreased mobility       PT Treatment Interventions Gait training;Therapeutic exercise;Stair training;Functional mobility training;Balance training;Patient/family education;DME instruction;Therapeutic activities    PT Goals (Current goals can be found in the  Care Plan section)  Acute Rehab PT Goals Patient Stated Goal: return home PT Goal Formulation: With patient Time For Goal Achievement: 02/26/17 Potential to Achieve Goals: Good    Frequency Min 3X/week   Barriers to discharge        Co-evaluation               AM-PAC PT "6 Clicks" Daily Activity  Outcome Measure Difficulty turning over in bed (including adjusting bedclothes, sheets and blankets)?: None Difficulty moving from lying on back to sitting on the side of the bed? : None Difficulty sitting down on and standing up from a chair with arms (e.g., wheelchair, bedside commode, etc,.)?: None Help needed moving to and from a bed to chair (including a wheelchair)?: A Little Help needed walking in hospital room?: A Little Help needed climbing 3-5 steps with a railing? : A Little 6 Click Score: 21    End of Session Equipment Utilized During Treatment: Gait belt Activity Tolerance: Patient tolerated treatment well Patient left: in chair;with call bell/phone within reach;with family/visitor present Nurse Communication: Mobility status PT Visit Diagnosis: Other abnormalities of gait and mobility (R26.89);Difficulty in walking, not elsewhere classified (R26.2)    Time: [id number] PT Time Calculation (min) (ACUTE ONLY): 21 min   Charges:   PT Evaluation $PT Eval Moderate Complexity: 1 Procedure     PT G Codes:        Delaney MeigsMaija Tabor Chaunte Hornbeck, PT [phone]   Lemon Whitacre B Tarahji Ramthun 02/19/2017, 2:20 PM

## 2017-02-19 NOTE — Progress Notes (Signed)
PROGRESS NOTE    [name]  ZOX:[id number] DOB: 1971-08-22 DOA: 02/15/2017 PCP: [facility]    Brief Narrative:  Patient is a 46 year gentleman history of diabetes mellitus type 1, alcohol induced chronic pancreatitis, ongoing alcohol abuse, hypertension presenting with diabetic foot ulcer/gangrene of the left forefoot ongoing since March with no significant improvement despite multiple rounds of antibiotics. Patient's podiatrist as patient to present to the hospital for admission for probable amputation to be performed.   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 Despite multiple rounds of antibiotics and wound care with podiatry patient's also has rapidly deteriorated per office note of 02/12/2017 of [facility] Doctor Gala Lewandowsky. Podiatry/Dr. Logan Bores asked patient to present to the hospital to perform amputation of first metatarsal as osteomyelitis noted on MRI of 02/06/2017. Patient afebrile. Wound cultures pending. Patient be seen in consultation by podiatry recommending partial first ray amputation . Patient status post partial first ray amputation left foot and incision and drainage of the left foot 02/18/2017 per Dr. Logan Bores. Continue empiric IV vancomycin and IV Zosyn. Pain management. Per podiatry. Will consult with ID for antibiotic duration and recommendations.   #2 poorly controlled type 1 diabetes mellitus Hemoglobin A1c is 12.4 02/16/2017. Patient  noted to have low CBG of 54 on 02/20/2017, just prior to lunch. Patient states has been very compliant with his medications. As noted on med rec that patient take 70/30  60 units at bedtime. Patient was placed on NPH 30 units daily however CBGs have been low. Discontinued long-acting insulin and meal coverage insulin. Postoperatively CBGs have ranged from 242 -256. Place on 70/3010 units twice daily. Sliding scale insulin and monitor.  #3 history of alcohol abuse No signs or symptoms of withdrawal. Continue Ativan withdrawal protocol. Continue thiamine and folic acid and multivitamin.  #4 severe protein calorie malnutrition Continue Glucerna.  #5 history of alcohol-induced chronic pancreatitis Stable. Continue Creon.   DVT prophylaxis: Lovenox Code Status: Full Family Communication: Updated patient. No family at bedside. Disposition Plan: Pending evaluation and management per podiatry.   Consultants:   Podiatry  Dr. Logan Bores 02/18/2017  Dr. Luciana Axe infectious diseases 02/19/2017  Procedures:   ABIs 02/19/2017  Partial first ray amputation left foot, incision and drainage left foot per Dr. Logan Bores 02/18/2017  Antimicrobials:   IV Zosyn 02/16/2017  IV vancomycin 02/16/2017   Subjective: Patient sitting up at bedside. No chest pain. No shortness of breath. States his left foot feels numb.  Objective: Vitals:   02/18/17 2000 02/18/17 2017 02/19/17 0156 02/19/17 0619  BP: (!) 154/101 (!) 146/104 109/79 119/76  Pulse: 67 74 71 84  Resp: 13 15 17 17   Temp: 97.7 F (36.5 C) 97.6 F (36.4 C) 98.3 F (36.8 C) 99.1 F (37.3 C)  TempSrc:  Oral Oral Oral  SpO2: 100% 100% 98% 97%  Weight:      Height:        Intake/Output Summary (Last 24 hours) at 02/19/17 1239 Last data filed  at 02/19/17 0900  Gross per 24 hour  Intake             1260 ml  Output              900 ml  Net              360 ml   Filed Weights   02/16/17 0017 02/16/17 0244  Weight: 66.5 kg (146 lb 9.7 oz)  66.5 kg (146 lb 9.7 oz)    Examination:  General exam: Appears calm and comfortable  Respiratory system: Clear to auscultation. Respiratory effort normal. Cardiovascular system: S1 & S2 heard, RRR. No JVD, murmurs, rubs, gallops or clicks. No pedal edema. Gastrointestinal system: Abdomen is nondistended, soft and nontender. No organomegaly or masses felt. Normal bowel sounds heard. Central nervous system: Alert and oriented. No focal neurological deficits. Extremities: Left foot wrapped in bandage.  Skin: No rashes, lesions or ulcers Psychiatry: Judgement and insight appear normal. Mood & affect appropriate.     Data Reviewed: I have personally reviewed following labs and imaging studies  CBC:  Recent Labs Lab 02/15/17 1829 02/16/17 0753 02/17/17 0447 02/18/17 0412 02/19/17 0708  WBC 8.3 8.9 9.3 7.8 8.2  NEUTROABS 4.9 4.6 3.9  --  5.3  HGB 13.1 13.8 14.0 13.4 12.1*  HCT 38.8* 39.5 41.3 39.7 36.5*  MCV 98.5 98.8 99.0 99.7 101.1*  PLT 285 297 343 324 311   Basic Metabolic Panel:  Recent Labs Lab 02/15/17 1829 02/16/17 0753 02/17/17 0447 02/18/17 0412 02/19/17 0708  NA 130* 132* 133* 132* 130*  K 4.0 3.5 3.3* 4.3 4.1  CL 98* 99* 97* 101 100*  CO2 21* 22 24 25 23   GLUCOSE 376* 236* 61* 133* 270*  BUN <5* 6 6 6  <5*  CREATININE 0.85 0.74 0.73 0.67 0.70  CALCIUM 9.1 9.2 9.5 9.4 8.8*  MG  --  1.8 2.1  --   --    GFR: Estimated Creatinine Clearance: 108.5 mL/min (by C-G formula based on SCr of 0.7 mg/dL). Liver Function Tests:  Recent Labs Lab 02/15/17 1829  AST 29  ALT 21  ALKPHOS 209*  BILITOT 0.5  PROT 7.8  ALBUMIN 2.9*   No results for input(s): LIPASE, AMYLASE in the last 168 hours. No results for input(s): AMMONIA in the last 168 hours. Coagulation Profile: No results for input(s): INR, PROTIME in the last 168 hours. Cardiac Enzymes: No results for input(s): CKTOTAL, CKMB, CKMBINDEX, TROPONINI in the last 168 hours. BNP (last 3 results) No  results for input(s): PROBNP in the last 8760 hours. HbA1C: No results for input(s): HGBA1C in the last 72 hours. CBG:  Recent Labs Lab 02/18/17 1725 02/18/17 1905 02/18/17 2117 02/19/17 0643 02/19/17 1207  GLUCAP 124* 170* 242* 252* 256*   Lipid Profile: No results for input(s): CHOL, HDL, LDLCALC, TRIG, CHOLHDL, LDLDIRECT in the last 72 hours. Thyroid Function Tests: No results for input(s): TSH, T4TOTAL, FREET4, T3FREE, THYROIDAB in the last 72 hours. Anemia Panel: No results for input(s): VITAMINB12, FOLATE, FERRITIN, TIBC, IRON, RETICCTPCT in the last 72 hours. Sepsis Labs:  Recent Labs Lab 02/15/17 1849 02/16/17 0055 02/16/17 0109  LATICACIDVEN 1.99* 1.41 1.63    Recent Results (from the past 240 hour(s))  Blood culture (routine x 2)     Status: None (Preliminary result)   Collection Time: 02/16/17 12:30 AM  Result Value Ref Range Status   Specimen Description BLOOD LEFT ANTECUBITAL  Final   Special Requests   Final  BOTTLES DRAWN AEROBIC AND ANAEROBIC Blood Culture adequate volume   Culture NO GROWTH 3 DAYS  Final   Report Status PENDING  Incomplete  Blood culture (routine x 2)     Status: None (Preliminary result)   Collection Time: 02/16/17 12:35 AM  Result Value Ref Range Status   Specimen Description BLOOD BLOOD RIGHT FOREARM  Final   Special Requests   Final    BOTTLES DRAWN AEROBIC AND ANAEROBIC Blood Culture adequate volume   Culture NO GROWTH 3 DAYS  Final   Report Status PENDING  Incomplete  Surgical pcr screen     Status: None   Collection Time: 02/16/17  3:03 AM  Result Value Ref Range Status   MRSA, PCR NEGATIVE NEGATIVE Final   Staphylococcus aureus NEGATIVE NEGATIVE Final    Comment:        The Xpert SA Assay (FDA approved for NASAL specimens in patients over 47 years of age), is one component of a comprehensive surveillance program.  Test performance has been validated by [facility] for patients greater than or equal to 1 year  old. It is not intended to diagnose infection nor to guide or monitor treatment.   Aerobic Culture (superficial specimen)     Status: None (Preliminary result)   Collection Time: 02/17/17  4:48 PM  Result Value Ref Range Status   Specimen Description FOOT LEFT  Final   Special Requests NONE  Final   Gram Stain   Final    MODERATE WBC PRESENT, PREDOMINANTLY PMN FEW GRAM POSITIVE COCCI IN PAIRS IN CHAINS    Culture CULTURE REINCUBATED FOR BETTER GROWTH  Final   Report Status PENDING  Incomplete  Aerobic/Anaerobic Culture (surgical/deep wound)     Status: None (Preliminary result)   Collection Time: 02/18/17  6:22 PM  Result Value Ref Range Status   Specimen Description WOUND LEFT TOE  Final   Special Requests NONE  Final   Gram Stain   Final    RARE WBC PRESENT,BOTH PMN AND MONONUCLEAR NO ORGANISMS SEEN    Culture PENDING  Incomplete   Report Status PENDING  Incomplete         Radiology Studies: No results found.      Scheduled Meds: . enoxaparin (LOVENOX) injection  40 mg Subcutaneous Q24H  . feeding supplement (GLUCERNA SHAKE)  237 mL Oral TID BM  . folic acid  1 mg Oral Daily  . insulin aspart  0-15 Units Subcutaneous TID WC  . insulin aspart protamine- aspart  10 Units Subcutaneous BID WC  . lipase/protease/amylase  36,000 Units Oral TID WC  . multivitamin with minerals  1 tablet Oral Daily  . sodium chloride flush  3 mL Intravenous Q12H  . thiamine  100 mg Oral Daily   Or  . thiamine  100 mg Intravenous Daily   Continuous Infusions: . sodium chloride    . piperacillin-tazobactam (ZOSYN)  IV Stopped (02/19/17 1025)  . vancomycin Stopped (02/19/17 1034)     LOS: 3 days    Time spent: 35 minutes    Priscilla Kirstein, MD [facility] Pager [phone]  If 7PM-7AM, please contact night-coverage [email or web address] Password [facility] 02/19/2017, 12:39 PM

## 2017-02-19 NOTE — Progress Notes (Signed)
VASCULAR LAB PRELIMINARY  ARTERIAL  ABI completed:    RIGHT    LEFT    PRESSURE WAVEFORM  PRESSURE WAVEFORM  BRACHIAL 115 Triphasic BRACHIAL 121 Triphasic  DP 120 Triphasic DP 130 Monophasic  AT   AT    PT 116 Triphasic PT 131 Triphasic  PER   PER    GREAT TOE  NA GREAT TOE  NA    RIGHT LEFT  ABI 0.99 1.08   Bilateral ABIs are within normal limits at rest.  02/19/2017 11:55 AM Gertie FeyMichelle Zyron Deeley, BS, RVT, RDCS, RDMS

## 2017-02-20 ENCOUNTER — Encounter (HOSPITAL_COMMUNITY): Payer: Self-pay | Admitting: Podiatry

## 2017-02-20 DIAGNOSIS — E108 Type 1 diabetes mellitus with unspecified complications: Secondary | ICD-10-CM

## 2017-02-20 DIAGNOSIS — M86172 Other acute osteomyelitis, left ankle and foot: Secondary | ICD-10-CM

## 2017-02-20 DIAGNOSIS — K860 Alcohol-induced chronic pancreatitis: Secondary | ICD-10-CM

## 2017-02-20 DIAGNOSIS — E1065 Type 1 diabetes mellitus with hyperglycemia: Secondary | ICD-10-CM

## 2017-02-20 LAB — CBC
HEMATOCRIT: 36 % — AB (ref 39.0–52.0)
Hemoglobin: 11.9 g/dL — ABNORMAL LOW (ref 13.0–17.0)
MCH: 33.1 pg (ref 26.0–34.0)
MCHC: 33.1 g/dL (ref 30.0–36.0)
MCV: 100 fL (ref 78.0–100.0)
Platelets: 316 10*3/uL (ref 150–400)
RBC: 3.6 MIL/uL — AB (ref 4.22–5.81)
RDW: 13.1 % (ref 11.5–15.5)
WBC: 9.4 10*3/uL (ref 4.0–10.5)

## 2017-02-20 LAB — GLUCOSE, CAPILLARY
Glucose-Capillary: 246 mg/dL — ABNORMAL HIGH (ref 65–99)
Glucose-Capillary: 142 mg/dL — ABNORMAL HIGH (ref 65–99)
Glucose-Capillary: 172 mg/dL — ABNORMAL HIGH (ref 65–99)

## 2017-02-20 LAB — BASIC METABOLIC PANEL
ANION GAP: 10 (ref 5–15)
BUN: 5 mg/dL — AB (ref 6–20)
CO2: 23 mmol/L (ref 22–32)
Calcium: 8.9 mg/dL (ref 8.9–10.3)
Chloride: 99 mmol/L — ABNORMAL LOW (ref 101–111)
Creatinine, Ser: 0.64 mg/dL (ref 0.61–1.24)
GFR calc Af Amer: >60 mL/min (ref >60)
GFR calc non Af Amer: >60 mL/min (ref >60)
GLUCOSE: 211 mg/dL — AB (ref 65–99)
POTASSIUM: 4 mmol/L (ref 3.5–5.1)
Sodium: 132 mmol/L — ABNORMAL LOW (ref 135–145)

## 2017-02-20 LAB — AEROBIC CULTURE  (SUPERFICIAL SPECIMEN)

## 2017-02-20 LAB — AEROBIC CULTURE W GRAM STAIN (SUPERFICIAL SPECIMEN)

## 2017-02-20 MED ORDER — AMOXICILLIN-POT CLAVULANATE 875-125 MG PO TABS: 1.0000 | ORAL_TABLET | Freq: Two times a day (BID) | ORAL | 0 refills | Status: AC

## 2017-02-20 MED ORDER — ACETAMINOPHEN 500 MG PO TABS: 500.0000 mg | ORAL_TABLET | Freq: Four times a day (QID) | ORAL | 0 refills | Status: AC | PRN

## 2017-02-20 MED ORDER — METHOCARBAMOL 750 MG PO TABS: 750.0000 mg | ORAL_TABLET | Freq: Three times a day (TID) | ORAL | 0 refills | Status: DC

## 2017-02-20 NOTE — Care Management (Signed)
Case manager has notified [facility] of patient's discharge HH needs, RW has been ordered. Resuption of care orders have been entered.

## 2017-02-20 NOTE — Progress Notes (Signed)
Pt ready for d/c home today per MD. Dan HumphreysWalker was delivered to pt's room, home health PT and RN arranged by CM. Dr. Logan BoresEvans was contacted for dressing change orders prior to pt d/c'ing (instructions put in home health order). Prescriptions and discharge instructions reviewed with pt, all questions answered.

## 2017-02-20 NOTE — Progress Notes (Signed)
Physical Therapy Treatment Patient Details Name: [name] MRN: [id number] DOB: March 10, 1971 Today's Date: 02/20/2017    History of Present Illness 46 yo admitted with left foot osteo s/p partial first ray amputation with I&D. PMHx: DM, ETOH abuse, pancreatitis    PT Comments    Pt making excellent progress with mobility. Pt demonstrated improved gait pattern this session, ambulating in hallway with RW and supervision for safety. PT will continue to follow acutely to ensure a safe d/c home.   Follow Up Recommendations  Outpatient PT     Equipment Recommendations  Rolling walker with 5" wheels    Recommendations for Other Services       Precautions / Restrictions Precautions Precautions: Fall Required Braces or Orthoses: Other Brace/Splint Other Brace/Splint: post op shoe left Restrictions Weight Bearing Restrictions: Yes LLE Weight Bearing: Weight bearing as tolerated Other Position/Activity Restrictions: with post-op shoe per RN, no WB'ing order    Mobility  Bed Mobility Overal bed mobility: Modified Independent                Transfers Overall transfer level: Modified independent Equipment used: None                Ambulation/Gait Ambulation/Gait assistance: Supervision Ambulation Distance (Feet): 400 Feet Assistive device: Rolling walker (2 wheeled) Gait Pattern/deviations: Step-through pattern Gait velocity: decreased Gait velocity interpretation: Below normal speed for age/gender General Gait Details: pt with improved L foot clearance and L ankle DF during swing phase   Stairs            Wheelchair Mobility    Modified Rankin (Stroke Patients Only)       Balance Overall balance assessment: Needs assistance Sitting-balance support: Feet supported;No upper extremity supported Sitting balance-Leahy Scale: Normal     Standing balance support: During functional activity;No upper extremity supported Standing balance-Leahy Scale:  Fair                              Cognition Arousal/Alertness: Awake/alert Behavior During Therapy: WFL for tasks assessed/performed Overall Cognitive Status: Within Functional Limits for tasks assessed                                        Exercises Other Exercises Other Exercises: PT encouraged pt to perform L ankle AROM    General Comments        Pertinent Vitals/Pain Pain Assessment: 0-10 Pain Score: 8  Pain Location: L foot Pain Descriptors / Indicators: Sore Pain Intervention(s): Monitored during session    Home Living                      Prior Function            PT Goals (current goals can now be found in the care plan section) Acute Rehab PT Goals PT Goal Formulation: With patient Time For Goal Achievement: 02/26/17 Potential to Achieve Goals: Good Progress towards PT goals: Progressing toward goals    Frequency    Min 3X/week      PT Plan Current plan remains appropriate    Co-evaluation              AM-PAC PT "6 Clicks" Daily Activity  Outcome Measure  Difficulty turning over in bed (including adjusting bedclothes, sheets and blankets)?: None Difficulty moving from lying on back to sitting  on the side of the bed? : None Difficulty sitting down on and standing up from a chair with arms (e.g., wheelchair, bedside commode, etc,.)?: None Help needed moving to and from a bed to chair (including a wheelchair)?: None Help needed walking in hospital room?: None Help needed climbing 3-5 steps with a railing? : A Little 6 Click Score: 23    End of Session   Activity Tolerance: Patient tolerated treatment well Patient left: in bed;with call bell/phone within reach Nurse Communication: Mobility status PT Visit Diagnosis: Other abnormalities of gait and mobility (R26.89);Difficulty in walking, not elsewhere classified (R26.2)     Time: [id number] PT Time Calculation (min) (ACUTE ONLY): 15 min  Charges:   $Gait Training: 8-22 mins                    G Codes:       BanqueteJennifer Kaydra Borgen, [place]PT, [place]PT [phone]    Alessandra BevelsJennifer M Sumedh Shinsato 02/20/2017, 9:40 AM

## 2017-02-20 NOTE — Evaluation (Signed)
Occupational Therapy Evaluation Patient Details Name: [name] MRN: [id number] DOB: Dec 05, 1970 Today's Date: 02/20/2017    History of Present Illness 46 yo admitted with left foot osteo s/p partial first ray amputation with I&D. PMHx: DM, ETOH abuse, pancreatitis   Clinical Impression   Pt was independent PTA and is planning to dc home with brother. Currently, pt performing ADLs and functional mobility at Mod I level. Provided education on LB ADLs and toilet transfer; pt demonstrated understanding. Answered all pt questions. Recommend dc home once medically stable per physician. All acute OT needs met and will sign off. Thank you.     Follow Up Recommendations  No OT follow up    Equipment Recommendations  None recommended by OT    Recommendations for Other Services       Precautions / Restrictions Precautions Precautions: Fall Precaution Comments: left foot drop post op Required Braces or Orthoses: Other Brace/Splint Other Brace/Splint: post op shoe left Restrictions Weight Bearing Restrictions: Yes LLE Weight Bearing: Weight bearing as tolerated Other Position/Activity Restrictions: with post-op shoe per RN, no WB'ing order      Mobility Bed Mobility               General bed mobility comments: At sink upon arrival  Transfers Overall transfer level: Modified independent Equipment used: Rolling walker (2 wheeled)                  Balance Overall balance assessment: Needs assistance Sitting-balance support: Feet supported;No upper extremity supported Sitting balance-Leahy Scale: Normal     Standing balance support: During functional activity;No upper extremity supported Standing balance-Leahy Scale: Good                             ADL either performed or assessed with clinical judgement   ADL Overall ADL's : Modified independent                                       General ADL Comments: Upon arrival, pt at sink  finishing sponge bath. Pt already donned underwear. Pt performed dressing, toilet transfer, and functional mobility with increased time and RW     Vision         Perception     Praxis      Pertinent Vitals/Pain Pain Assessment: Faces Faces Pain Scale: Hurts a little bit Pain Location: L foot Pain Descriptors / Indicators: Sore Pain Intervention(s): Monitored during session;Patient requesting pain meds-RN notified     Hand Dominance Right   Extremity/Trunk Assessment Upper Extremity Assessment Upper Extremity Assessment: Overall WFL for tasks assessed   Lower Extremity Assessment Lower Extremity Assessment: Defer to PT evaluation;Overall [facility] for tasks assessed   Cervical / Trunk Assessment Cervical / Trunk Assessment: Normal   Communication Communication Communication: No difficulties   Cognition Arousal/Alertness: Awake/alert Behavior During Therapy: WFL for tasks assessed/performed Overall Cognitive Status: Within Functional Limits for tasks assessed                                     General Comments       Exercises     Shoulder Instructions      Home Living Family/patient expects to be discharged to:: Private residence Living Arrangements: Other relatives (brother) Available Help at Discharge: Family;Available  24 hours/day Type of Home: House Home Access: Stairs to enter [employer] of Steps: 3   Home Layout: Laundry or work area in basement;Two level;Able to live on main level with bedroom/bathroom     Bathroom Shower/Tub: Tub/shower unit;Walk-in Psychologist, prison and probation services: Standard     Home Equipment: Crutches;Shower seat          Prior Functioning/Environment Level of Independence: Independent                 OT Problem List: Decreased strength;Decreased activity tolerance;Impaired balance (sitting and/or standing);Decreased knowledge of use of DME or AE;Decreased knowledge of precautions;Pain      OT  Treatment/Interventions:      OT Goals(Current goals can be found in the care plan section) Acute Rehab OT Goals Patient Stated Goal: return home OT Goal Formulation: With patient Time For Goal Achievement: 03/06/17 Potential to Achieve Goals: Good  OT Frequency:     Barriers to D/C:            Co-evaluation              AM-PAC PT "6 Clicks" Daily Activity     Outcome Measure Help from another person eating meals?: None Help from another person taking care of personal grooming?: None Help from another person toileting, which includes using toliet, bedpan, or urinal?: None Help from another person bathing (including washing, rinsing, drying)?: A Little Help from another person to put on and taking off regular upper body clothing?: A Little Help from another person to put on and taking off regular lower body clothing?: None 6 Click Score: 22   End of Session Equipment Utilized During Treatment: Rolling walker Nurse Communication: Mobility status;Patient requests pain meds  Activity Tolerance: Patient tolerated treatment well Patient left: in bed;Other (comment);with call bell/phone within reach (sitting EOB)  OT Visit Diagnosis: Unsteadiness on feet (R26.81);Other abnormalities of gait and mobility (R26.89);Pain Pain - Right/Left: Left Pain - part of body: Ankle and joints of foot                Time: 0539-7673 OT Time Calculation (min): 11 min Charges:  OT General Charges $OT Visit: 1 Procedure OT Evaluation $OT Eval Low Complexity: 1 Procedure G-Codes:     Kazumi Lachney, OTR/L [phone]  [place] 02/20/2017, 1:30 PM

## 2017-02-20 NOTE — Progress Notes (Signed)
Inpatient Diabetes Program Recommendations  AACE/ADA: New Consensus Statement on Inpatient Glycemic Control (2015)  Target Ranges:  Prepandial:   less than 140 mg/dL      Peak postprandial:   less than 180 mg/dL (1-2 hours)      Critically ill patients:  140 - 180 mg/dL   Results for [name], Kariem L (MRN [id number]) as of 02/20/2017 09:01  Ref. Range 02/19/2017 06:43 02/19/2017 12:07 02/19/2017 16:53 02/19/2017 20:55 02/20/2017 06:31  Glucose-Capillary Latest Ref Range: 65 - 99 mg/dL 409252 (H) 811256 (H) 914165 (H) 205 (H) 246 (H)   Review of Glycemic Control  Outpatient Diabetes medications: 70/30 70 QAM, 60 units QPM Current orders for Inpatient glycemic control: 70/30 10 units BID  Inpatient Diabetes Program Recommendations:    Glucose trends in the 200's, Consider increasing 70/30 to 15 units BID.  Thanks,  Christena DeemShannon Azarya Oconnell RN, MSN, [facility] HospitalCC[facility] Inpatient Diabetes Coordinator Team Pager [phone] (8a-5p)

## 2017-02-20 NOTE — Discharge Summary (Addendum)
Physician Discharge Summary  [name] GNF:[id number] DOB: October 24, 1970 DOA: 02/15/2017  PCP: [facility]  Admit date: 02/15/2017 Discharge date: 02/20/2017  Admitted From:home Disposition:home with home care services  Recommendations for Outpatient Follow-up:  1. Follow up with PCP and podiatrist in 1-2 weeks 2. Please obtain BMP/CBC in one week  Home Health:yes Equipment/Devices:walker Discharge Condition:stable CODE STATUS:full code Diet recommendation:carb modified heart healthy diet  Brief/Interim Summary:45 year gentleman history of diabetes mellitus type 1, alcohol induced chronic pancreatitis, ongoing alcohol abuse, hypertension presenting with diabetic foot ulcer/gangrene of the left forefoot ongoing since March with no significant improvement despite multiple rounds of antibiotics.Patient was presented to the hospital for admission as per his podiatrist for surgical intervention.  # diabetic foot ulcer with osteomyelitis of the left foot/cellulitis left foot -MRI with osteomyelitis of the port. 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 Initially treated with IV vancomycin and Zosyn. Discharging with oral Augmentin for 10 days as per infectious disease. Patient is clinically improved. He was walking in the hallway with help of physical therapy. He'll be discharged with oral antibiotics, home care nurses for wound care and close outpatient follow-up with PCP and podiatrist. Patient verbalized understanding. He is clinically stable.  # poorly controlled type 1 diabetes mellitus with hyperglycemia: Resume insulin on discharge. Recommended to monitor blood sugar level and follow-up with PCP. Hemoglobin A1c is 12.4 02/16/2017.   # history of alcohol abuse -No sign of withdrawal. Education provided to the patient.   # severe protein calorie malnutrition Continue Glucerna.  # history of alcohol-induced  chronic pancreatitis Stable. Continue Creon.  Hypokalemia: improved.  Patient is clinically stable.  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)   Protein-calorie malnutrition, severe   Subacute osteomyelitis of left foot [facility])    Discharge Instructions  Discharge Instructions    Call MD for:  difficulty breathing, headache or visual disturbances    Complete by:  As directed    Call MD for:  extreme fatigue    Complete by:  As directed    Call MD for:  hives    Complete by:  As directed    Call MD for:  persistant dizziness or light-headedness    Complete by:  As directed    Call MD for:  persistant nausea and vomiting    Complete by:  As directed    Call MD for:  redness, tenderness, or signs of infection (pain, swelling, redness, odor or green/yellow discharge around incision site)    Complete by:  As directed    Call MD for:  severe uncontrolled pain    Complete by:  As directed    Call MD for:  temperature >100.4    Complete by:  As directed    Diet - low sodium heart healthy    Complete by:  As directed    Diet Carb Modified    Complete by:  As directed    Discharge instructions    Complete by:  As directed    Daily dry gauze, ABD and ACE wrap to the left foot.  No dressing to the left great toe.   Increase activity slowly    Complete by:  As directed  Allergies as of 02/20/2017   No Known Allergies     Medication List    TAKE these medications   acetaminophen 500 MG tablet Commonly known as:  TYLENOL Take 1 tablet (500 mg total) by mouth every 6 (six) hours as needed.   CREON 12000 units Cpep capsule Generic drug:  lipase/protease/amylase TAKE 3 CAPSULES BY MOUTH THREE TIMES DAILY WITH MEALS AND 1 WITH SNACKS.    HYDROcodone-acetaminophen 7.5-325 MG tablet Commonly known as:  NORCO Take 1 tablet by mouth every 6 (six) hours.   insulin aspart protamine - aspart (70-30) 100 UNIT/ML FlexPen Commonly known as:  NOVOLOG MIX 70/30 FLEXPEN Inject 70 units sq in the morning and 60 units in the evening.   methocarbamol 750 MG tablet Commonly known as:  ROBAXIN-750 Take 1 tablet (750 mg total) by mouth 3 (three) times daily.            Durable Medical Equipment        Start     Ordered   02/19/17 1805  For home use only DME Walker rolling  Once    Question:  Patient needs a walker to treat with the following condition  Answer:  Osteomyelitis (HCC)   02/19/17 1804     Follow-up Information    [facility] Schedule an appointment as soon as possible for a visit in 1 week(s).   Contact information: [address] [place] [address] [phone]        Felecia Shelling, DPM. Schedule an appointment as soon as possible for a visit in 1 week(s).   Specialty:  Podiatry Why:  call with any question related with wound. Contact information: [address] [place] [address] [phone]          No Known Allergies  Consultations:  Podiatry  Dr. Logan Bores 02/18/2017  Dr. Luciana Axe infectious diseases 02/19/2017  Procedures/Studies:  ABIs 02/19/2017  Partial first ray amputation left foot, incision and drainage left foot per Dr. Logan Bores 02/18/2017  Subjective: Seen and examined at bedside. Denied pain, nausea, vomiting, chest pain or shortness of breath. He is able to ambulate without difficulties.  Discharge Exam: Vitals:   02/19/17 2050 02/20/17 0409  BP: 134/85 (!) 142/91  Pulse: 95 83  Resp: 18 18  Temp: 98.5 F (36.9 C) 98.8 F (37.1 C)   Vitals:   02/19/17 0619 02/19/17 1500 02/19/17 2050 02/20/17 0409  BP: 119/76 112/83 134/85 (!) 142/91  Pulse: 84 96 95 83  Resp: 17 18 18 18   Temp: 99.1 F (37.3 C) 98.7 F (37.1 C) 98.5 F (36.9  C) 98.8 F (37.1 C)  TempSrc: Oral Oral Oral Oral  SpO2: 97% 98% 100% 100%  Weight:      Height:        General: Pt is alert, awake, not in acute distress Cardiovascular: RRR, S1/S2 +, no rubs, no gallops Respiratory: CTA bilaterally, no wheezing, no rhonchi Abdominal: Soft, NT, ND, bowel sounds + Extremities: Left foot wrapped in bandages, no sign of bleeding or discharge.    The results of significant diagnostics from this hospitalization (including imaging, microbiology, ancillary and laboratory) are listed below for reference.     Microbiology: Recent Results (from the past 240 hour(s))  Blood culture (routine x 2)     Status: None (Preliminary result)   Collection Time: 02/16/17 12:30 AM  Result Value Ref Range Status   Specimen Description BLOOD LEFT ANTECUBITAL  Final   Special Requests  Final    BOTTLES DRAWN AEROBIC AND ANAEROBIC Blood Culture adequate volume   Culture NO GROWTH 3 DAYS  Final   Report Status PENDING  Incomplete  Blood culture (routine x 2)     Status: None (Preliminary result)   Collection Time: 02/16/17 12:35 AM  Result Value Ref Range Status   Specimen Description BLOOD BLOOD RIGHT FOREARM  Final   Special Requests   Final    BOTTLES DRAWN AEROBIC AND ANAEROBIC Blood Culture adequate volume   Culture NO GROWTH 3 DAYS  Final   Report Status PENDING  Incomplete  Surgical pcr screen     Status: None   Collection Time: 02/16/17  3:03 AM  Result Value Ref Range Status   MRSA, PCR NEGATIVE NEGATIVE Final   Staphylococcus aureus NEGATIVE NEGATIVE Final    Comment:        The Xpert SA Assay (FDA approved for NASAL specimens in patients over 82 years of age), is one component of a comprehensive surveillance program.  Test performance has been validated by [facility] for patients greater than or equal to 69 year old. It is not intended to diagnose infection nor to guide or monitor treatment.   Aerobic Culture (superficial specimen)      Status: None (Preliminary result)   Collection Time: 02/17/17  4:48 PM  Result Value Ref Range Status   Specimen Description FOOT LEFT  Final   Special Requests NONE  Final   Gram Stain   Final    MODERATE WBC PRESENT, PREDOMINANTLY PMN FEW GRAM POSITIVE COCCI IN PAIRS IN CHAINS    Culture CULTURE REINCUBATED FOR BETTER GROWTH  Final   Report Status PENDING  Incomplete  Aerobic/Anaerobic Culture (surgical/deep wound)     Status: None (Preliminary result)   Collection Time: 02/18/17  6:22 PM  Result Value Ref Range Status   Specimen Description WOUND LEFT TOE  Final   Special Requests NONE  Final   Gram Stain   Final    RARE WBC PRESENT,BOTH PMN AND MONONUCLEAR NO ORGANISMS SEEN    Culture NO GROWTH 2 DAYS  Final   Report Status PENDING  Incomplete     Labs: BNP (last 3 results) No results for input(s): BNP in the last 8760 hours. Basic Metabolic Panel:  Recent Labs Lab 02/16/17 0753 02/17/17 0447 02/18/17 0412 02/19/17 0708 02/20/17 0506  NA 132* 133* 132* 130* 132*  K 3.5 3.3* 4.3 4.1 4.0  CL 99* 97* 101 100* 99*  CO2 22 24 25 23 23   GLUCOSE 236* 61* 133* 270* 211*  BUN 6 6 6  <5* 5*  CREATININE 0.74 0.73 0.67 0.70 0.64  CALCIUM 9.2 9.5 9.4 8.8* 8.9  MG 1.8 2.1  --   --   --    Liver Function Tests:  Recent Labs Lab 02/15/17 1829  AST 29  ALT 21  ALKPHOS 209*  BILITOT 0.5  PROT 7.8  ALBUMIN 2.9*   No results for input(s): LIPASE, AMYLASE in the last 168 hours. No results for input(s): AMMONIA in the last 168 hours. CBC:  Recent Labs Lab 02/15/17 1829 02/16/17 0753 02/17/17 0447 02/18/17 0412 02/19/17 0708 02/20/17 0506  WBC 8.3 8.9 9.3 7.8 8.2 9.4  NEUTROABS 4.9 4.6 3.9  --  5.3  --   HGB 13.1 13.8 14.0 13.4 12.1* 11.9*  HCT 38.8* 39.5 41.3 39.7 36.5* 36.0*  MCV 98.5 98.8 99.0 99.7 101.1* 100.0  PLT 285 297 343 324 311 316   Cardiac Enzymes:  No results for input(s): CKTOTAL, CKMB, CKMBINDEX, TROPONINI in the last 168 hours. BNP: Invalid  input(s): POCBNP CBG:  Recent Labs Lab 02/19/17 0643 02/19/17 1207 02/19/17 1653 02/19/17 2055 02/20/17 0631  GLUCAP 252* 256* 165* 205* 246*   D-Dimer No results for input(s): DDIMER in the last 72 hours. Hgb A1c No results for input(s): HGBA1C in the last 72 hours. Lipid Profile No results for input(s): CHOL, HDL, LDLCALC, TRIG, CHOLHDL, LDLDIRECT in the last 72 hours. Thyroid function studies No results for input(s): TSH, T4TOTAL, T3FREE, THYROIDAB in the last 72 hours.  Invalid input(s): FREET3 Anemia work up No results for input(s): VITAMINB12, FOLATE, FERRITIN, TIBC, IRON, RETICCTPCT in the last 72 hours. Urinalysis    Component Value Date/Time   COLORURINE YELLOW 02/15/2017 1958   APPEARANCEUR CLEAR 02/15/2017 1958   LABSPEC 1.027 02/15/2017 1958   PHURINE 6.0 02/15/2017 1958   GLUCOSEU >=500 (A) 02/15/2017 1958   HGBUR SMALL (A) 02/15/2017 1958   BILIRUBINUR NEGATIVE 02/15/2017 1958   KETONESUR 5 (A) 02/15/2017 1958   PROTEINUR 100 (A) 02/15/2017 1958   UROBILINOGEN 0.2 02/15/2013 1915   NITRITE NEGATIVE 02/15/2017 1958   LEUKOCYTESUR NEGATIVE 02/15/2017 1958   Sepsis Labs Invalid input(s): PROCALCITONIN,  WBC,  LACTICIDVEN Microbiology Recent Results (from the past 240 hour(s))  Blood culture (routine x 2)     Status: None (Preliminary result)   Collection Time: 02/16/17 12:30 AM  Result Value Ref Range Status   Specimen Description BLOOD LEFT ANTECUBITAL  Final   Special Requests   Final    BOTTLES DRAWN AEROBIC AND ANAEROBIC Blood Culture adequate volume   Culture NO GROWTH 3 DAYS  Final   Report Status PENDING  Incomplete  Blood culture (routine x 2)     Status: None (Preliminary result)   Collection Time: 02/16/17 12:35 AM  Result Value Ref Range Status   Specimen Description BLOOD BLOOD RIGHT FOREARM  Final   Special Requests   Final    BOTTLES DRAWN AEROBIC AND ANAEROBIC Blood Culture adequate volume   Culture NO GROWTH 3 DAYS  Final   Report  Status PENDING  Incomplete  Surgical pcr screen     Status: None   Collection Time: 02/16/17  3:03 AM  Result Value Ref Range Status   MRSA, PCR NEGATIVE NEGATIVE Final   Staphylococcus aureus NEGATIVE NEGATIVE Final    Comment:        The Xpert SA Assay (FDA approved for NASAL specimens in patients over 47 years of age), is one component of a comprehensive surveillance program.  Test performance has been validated by [facility] for patients greater than or equal to 47 year old. It is not intended to diagnose infection nor to guide or monitor treatment.   Aerobic Culture (superficial specimen)     Status: None (Preliminary result)   Collection Time: 02/17/17  4:48 PM  Result Value Ref Range Status   Specimen Description FOOT LEFT  Final   Special Requests NONE  Final   Gram Stain   Final    MODERATE WBC PRESENT, PREDOMINANTLY PMN FEW GRAM POSITIVE COCCI IN PAIRS IN CHAINS    Culture CULTURE REINCUBATED FOR BETTER GROWTH  Final   Report Status PENDING  Incomplete  Aerobic/Anaerobic Culture (surgical/deep wound)     Status: None (Preliminary result)   Collection Time: 02/18/17  6:22 PM  Result Value Ref Range Status   Specimen Description WOUND LEFT TOE  Final   Special Requests NONE  Final  Gram Stain   Final    RARE WBC PRESENT,BOTH PMN AND MONONUCLEAR NO ORGANISMS SEEN    Culture NO GROWTH 2 DAYS  Final   Report Status PENDING  Incomplete     Time coordinating discharge: 32 minutes  SIGNED:   Maxie Barb, MD  [facility] 02/20/2017, 10:55 AM  If 7PM-7AM, please contact night-coverage [email or web address] Password [email or web address]

## 2017-02-20 NOTE — Anesthesia Postprocedure Evaluation (Signed)
Anesthesia Post Note  Patient: [name]  Procedure(s) Performed: Procedure(s) (LRB): PARTIAL FIRST RAY AMPUTATION LEFT FOOT. INCISION AND DRAINAGE LEFT FOOT. (Left)     Patient location during evaluation: PACU Anesthesia Type: Regional and MAC Level of consciousness: awake and alert Pain management: pain level controlled Vital Signs Assessment: post-procedure vital signs reviewed and stable Respiratory status: spontaneous breathing, nonlabored ventilation, respiratory function stable and patient connected to nasal cannula oxygen Cardiovascular status: stable and blood pressure returned to baseline Anesthetic complications: no    Last Vitals:  Vitals:   02/19/17 2050 02/20/17 0409  BP: 134/85 (!) 142/91  Pulse: 95 83  Resp: 18 18  Temp: 36.9 C 37.1 C    Last Pain:  Vitals:   02/20/17 0800  TempSrc:   PainSc: [place]

## 2017-02-21 ENCOUNTER — Other Ambulatory Visit: Payer: Self-pay | Admitting: "Endocrinology

## 2017-02-21 LAB — CULTURE, BLOOD (ROUTINE X 2)
Culture: NO GROWTH
Culture: NO GROWTH
SPECIAL REQUESTS: ADEQUATE
Special Requests: ADEQUATE

## 2017-02-23 LAB — AEROBIC/ANAEROBIC CULTURE W GRAM STAIN (SURGICAL/DEEP WOUND)

## 2017-02-23 LAB — AEROBIC/ANAEROBIC CULTURE (SURGICAL/DEEP WOUND)

## 2017-02-26 ENCOUNTER — Other Ambulatory Visit: Payer: Self-pay

## 2017-02-26 ENCOUNTER — Ambulatory Visit (INDEPENDENT_AMBULATORY_CARE_PROVIDER_SITE_OTHER): Payer: Medicare Other

## 2017-02-26 ENCOUNTER — Encounter: Payer: Self-pay | Admitting: Podiatry

## 2017-02-26 ENCOUNTER — Ambulatory Visit (INDEPENDENT_AMBULATORY_CARE_PROVIDER_SITE_OTHER): Payer: Self-pay | Admitting: Podiatry

## 2017-02-26 DIAGNOSIS — Z89412 Acquired absence of left great toe: Secondary | ICD-10-CM

## 2017-02-26 DIAGNOSIS — S98132A Complete traumatic amputation of one left lesser toe, initial encounter: Secondary | ICD-10-CM

## 2017-02-26 MED ORDER — HYDROCODONE-ACETAMINOPHEN 7.5-325 MG PO TABS: 1.0000 | ORAL_TABLET | Freq: Four times a day (QID) | ORAL | 0 refills | Status: DC

## 2017-03-07 NOTE — Progress Notes (Signed)
   Subjective:  Patient presents today status post partial 1st ray amputation left lower extremity surgery. DOS: 02/18/17. Pt reports pain where the packing is located. He is requesting pain medication.     Objective/Physical Exam Skin incisions appear to be well coapted with sutures and staples intact. No sign of infectious process noted. No dehiscence. No active bleeding noted. Moderate edema noted to the surgical extremity.  Radiographic Exam:  Osteotomies sites appear to be stable with routine healing.  Assessment: 1. s/p partial 1st ray ampuation surgery left lower extremity. DOS: 02/18/17   Plan of Care:  1. Patient was evaluated. X-rays reviewed 2. Dressing changed. 3. Continue nurse dressing changes.  4. Prescription for Vicodin 7.5/325 mg given to patient. 5. Return to clinic in 2 weeks.   Felecia ShellingBrent M. Leviathan Macera, DPM [facility]  Dr. Felecia ShellingBrent M. Saleemah Mollenhauer, DPM    [address]                                        [place], [place] [address]                Office [phone]  Fax [phone]

## 2017-03-12 ENCOUNTER — Ambulatory Visit (INDEPENDENT_AMBULATORY_CARE_PROVIDER_SITE_OTHER): Payer: Medicare Other | Admitting: Podiatry

## 2017-03-12 DIAGNOSIS — Z89412 Acquired absence of left great toe: Secondary | ICD-10-CM

## 2017-03-12 MED ORDER — SULFAMETHOXAZOLE-TRIMETHOPRIM 800-160 MG PO TABS: 1.0000 | ORAL_TABLET | Freq: Two times a day (BID) | ORAL | 0 refills | Status: DC

## 2017-03-12 MED ORDER — HYDROCODONE-ACETAMINOPHEN 7.5-325 MG PO TABS: 1.0000 | ORAL_TABLET | Freq: Four times a day (QID) | ORAL | 0 refills | Status: DC

## 2017-03-13 NOTE — Progress Notes (Signed)
   Subjective:  Patient presents today status post partial 1st ray amputation left lower extremity surgery. DOS: 02/18/17. He reports continued stiffness of the left ankle. He reports some drainage from the suture site on the plantar forefoot. He denies any new complaints at this time.    Objective/Physical Exam Minimal drainage from plantar incision site. Incision otherwise well coapted. Staples intact. No sign of infectious process noted. No dehiscence. No active bleeding noted. Moderate edema noted to the surgical extremity.   Assessment: 1. s/p partial 1st ray ampuation surgery left lower extremity. DOS: 02/18/17   Plan of Care:  1. Patient was evaluated. 2. Prescription for Bactrim DS #28 given to patient. 3. Continue dressing changes at home with betadine and dry sterile dressing.  4. Refill prescription for Vicodin 7.5/325 mg given to patient. 5. Return to clinic in 2 weeks.   Felecia ShellingBrent M. Adeline Petitfrere, DPM [facility]  Dr. Felecia ShellingBrent M. Silvia Hightower, DPM    [address]                                        [place], [place] [address]                Office [phone]  Fax [phone]

## 2017-03-26 ENCOUNTER — Encounter: Payer: Self-pay | Admitting: Podiatry

## 2017-03-26 ENCOUNTER — Ambulatory Visit (INDEPENDENT_AMBULATORY_CARE_PROVIDER_SITE_OTHER): Payer: Self-pay | Admitting: Podiatry

## 2017-03-26 ENCOUNTER — Ambulatory Visit (INDEPENDENT_AMBULATORY_CARE_PROVIDER_SITE_OTHER): Payer: Medicare Other

## 2017-03-26 ENCOUNTER — Other Ambulatory Visit: Payer: Self-pay

## 2017-03-26 DIAGNOSIS — M79672 Pain in left foot: Secondary | ICD-10-CM

## 2017-03-26 DIAGNOSIS — Z89412 Acquired absence of left great toe: Secondary | ICD-10-CM

## 2017-03-26 DIAGNOSIS — E0842 Diabetes mellitus due to underlying condition with diabetic polyneuropathy: Secondary | ICD-10-CM

## 2017-03-26 MED ORDER — HYDROCODONE-ACETAMINOPHEN 7.5-325 MG PO TABS: 1.0000 | ORAL_TABLET | Freq: Four times a day (QID) | ORAL | 0 refills | Status: DC

## 2017-04-01 ENCOUNTER — Other Ambulatory Visit: Payer: Self-pay | Admitting: "Endocrinology

## 2017-04-01 DIAGNOSIS — E108 Type 1 diabetes mellitus with unspecified complications: Secondary | ICD-10-CM | POA: Diagnosis not present

## 2017-04-01 DIAGNOSIS — E1065 Type 1 diabetes mellitus with hyperglycemia: Secondary | ICD-10-CM | POA: Diagnosis not present

## 2017-04-01 LAB — COMPREHENSIVE METABOLIC PANEL
ALK PHOS: 203 U/L — AB (ref 40–115)
ALT: 68 U/L — AB (ref 9–46)
AST: 64 U/L — ABNORMAL HIGH (ref 10–40)
Albumin: 4.3 g/dL (ref 3.6–5.1)
BUN: 9 mg/dL (ref 7–25)
CO2: 23 mmol/L (ref 20–31)
Calcium: 9.8 mg/dL (ref 8.6–10.3)
Chloride: 99 mmol/L (ref 98–110)
Creat: 1.03 mg/dL (ref 0.60–1.35)
GLUCOSE: 322 mg/dL — AB (ref 65–99)
POTASSIUM: 4.9 mmol/L (ref 3.5–5.3)
Sodium: 131 mmol/L — ABNORMAL LOW (ref 135–146)
Total Bilirubin: 0.7 mg/dL (ref 0.2–1.2)
Total Protein: 8.3 g/dL — ABNORMAL HIGH (ref 6.1–8.1)

## 2017-04-02 LAB — HEMOGLOBIN A1C
Hgb A1c MFr Bld: 12.2 % — ABNORMAL HIGH (ref <5.7)
Mean Plasma Glucose: 303 mg/dL

## 2017-04-04 ENCOUNTER — Ambulatory Visit (INDEPENDENT_AMBULATORY_CARE_PROVIDER_SITE_OTHER): Payer: Medicare Other | Admitting: "Endocrinology

## 2017-04-04 ENCOUNTER — Other Ambulatory Visit: Payer: Self-pay | Admitting: "Endocrinology

## 2017-04-04 ENCOUNTER — Encounter: Payer: Self-pay | Admitting: "Endocrinology

## 2017-04-04 VITALS — BP 141/93 | HR 83 | Ht 73.0 in | Wt 155.0 lb

## 2017-04-04 DIAGNOSIS — IMO0002 Reserved for concepts with insufficient information to code with codable children: Secondary | ICD-10-CM

## 2017-04-04 DIAGNOSIS — I10 Essential (primary) hypertension: Secondary | ICD-10-CM

## 2017-04-04 DIAGNOSIS — E1065 Type 1 diabetes mellitus with hyperglycemia: Secondary | ICD-10-CM | POA: Diagnosis not present

## 2017-04-04 DIAGNOSIS — E108 Type 1 diabetes mellitus with unspecified complications: Secondary | ICD-10-CM

## 2017-04-04 DIAGNOSIS — K8681 Exocrine pancreatic insufficiency: Secondary | ICD-10-CM

## 2017-04-04 DIAGNOSIS — I70245 Atherosclerosis of native arteries of left leg with ulceration of other part of foot: Secondary | ICD-10-CM | POA: Diagnosis not present

## 2017-04-04 MED ORDER — INSULIN ASPART PROT & ASPART (70-30 MIX) 100 UNIT/ML PEN
PEN_INJECTOR | SUBCUTANEOUS | 0 refills | Status: DC
Start: 2017-04-04 — End: 2017-04-04

## 2017-04-04 MED ORDER — PANCRELIPASE (LIP-PROT-AMYL) 36000-114000 UNITS PO CPEP: ORAL_CAPSULE | ORAL | 3 refills | Status: DC

## 2017-04-04 NOTE — Patient Instructions (Signed)
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
## 2017-04-09 ENCOUNTER — Ambulatory Visit (INDEPENDENT_AMBULATORY_CARE_PROVIDER_SITE_OTHER): Payer: Medicare Other | Admitting: Podiatry

## 2017-04-09 ENCOUNTER — Encounter: Payer: Self-pay | Admitting: Podiatry

## 2017-04-09 ENCOUNTER — Other Ambulatory Visit: Payer: Self-pay

## 2017-04-09 DIAGNOSIS — E0842 Diabetes mellitus due to underlying condition with diabetic polyneuropathy: Secondary | ICD-10-CM | POA: Diagnosis not present

## 2017-04-09 DIAGNOSIS — L97522 Non-pressure chronic ulcer of other part of left foot with fat layer exposed: Secondary | ICD-10-CM

## 2017-04-09 DIAGNOSIS — I70245 Atherosclerosis of native arteries of left leg with ulceration of other part of foot: Secondary | ICD-10-CM

## 2017-04-09 MED ORDER — HYDROCODONE-ACETAMINOPHEN 7.5-325 MG PO TABS: 1.0000 | ORAL_TABLET | Freq: Four times a day (QID) | ORAL | 0 refills | Status: DC

## 2017-04-09 NOTE — Progress Notes (Signed)
   Subjective:  Patient presents today for follow-up treatment and evaluation following partial first ray amputation of the left foot. Patient ultimately developed a small diabetic foot ulceration. He otherwise doing very well. He states that there is improvement however he still has some residual pain. Patient presents today for follow-up treatment and evaluation   Objective/Physical Exam General: The patient is alert and oriented x3 in no acute distress.  Dermatology:  Wound #1 noted to the first ray amputation stump left foot measuring approximately 0.40.20.1 cm (LxWxD).   To the noted ulceration(s), there is no eschar. There is a moderate amount of slough, fibrin, and necrotic tissue noted. Granulation tissue and wound base is red. There is a minimal amount of serosanguineous drainage noted. There is no exposed bone muscle-tendon ligament or joint. There is no malodor. Periwound integrity is intact. Skin is warm, dry and supple bilateral lower extremities.  Vascular: Palpable pedal pulses bilaterally. No edema or erythema noted. Capillary refill within normal limits.  Neurological: Epicritic and protective threshold absent bilaterally.   Musculoskeletal Exam: Range of motion within normal limits to all pedal and ankle joints bilateral. Muscle strength 5/5 in all groups bilateral.   Assessment: #1 left foot ulceration secondary to diabetes mellitus #2 diabetes mellitus w/ peripheral neuropathy #3 history of partial first ray amputation left foot   Plan of Care:  #1 Patient was evaluated. #2 medically necessary excisional debridement including subcutaneous tissue was performed using a tissue nipper and a chisel blade. Excisional debridement of all the necrotic nonviable tissue down to healthy bleeding viable tissue was performed with post-debridement measurements same as pre-. #3 the wound was cleansed and dry sterile dressing applied. #4 iodine ointment was provided for the patient  to be applied daily followed by a Band-Aid #5 today were in arrange an appointment with [name] to be fitted for diabetic custom molded shoes with a toe filler #6 patient is to return to clinic in 2 weeks.   Felecia ShellingBrent M. Finneas [name], DPM [facility]  Dr. Felecia ShellingBrent M. Schae [name], DPM    [address]                                        [place], [place] [address]                Office [phone]) [phone]  Fax [phone]

## 2017-04-13 NOTE — Progress Notes (Signed)
   Subjective:  Patient presents today status post partial first ray amputation of the left lower extremity. DOS: 02/18/2017. Patient states that he believes is getting slowly better. He has not experienced is much drainage. He is still having some intermittent pain.     Objective/Physical Exam Skin incisions appear to be well coapted. Staples and sutures appear to be intact there is no significant evidence of dehiscence noted. No sign of active infection.  Radiographic Exam:  Orthopedic osteotomy site appears to be stable with routine healing. No evidence of cortical destruction or irregularity suggestive of osteomyelitis  Assessment: 1. s/p partial first ray amputation left foot. Date of surgery 02/18/2017   Plan of Care:  1. Patient was evaluated. X-rays reviewed 2. Today the sutures were removed. Dry sterile dressing was applied 3. Prescription for Percocet 5/325 mg when necessary pain 4. Continue home health dressing changes 5. Return to clinic in 2 weeks   Felecia ShellingBrent M. Honestii Marton, DPM [facility]  Dr. Felecia ShellingBrent M. Harrietta Incorvaia, DPM    [address]                                        [place], [place] [address]                Office [phone](336) [phone]  Fax [phone]

## 2017-04-17 ENCOUNTER — Ambulatory Visit: Payer: Medicare Other | Admitting: Orthotics

## 2017-04-17 DIAGNOSIS — E0842 Diabetes mellitus due to underlying condition with diabetic polyneuropathy: Secondary | ICD-10-CM

## 2017-04-17 DIAGNOSIS — L97522 Non-pressure chronic ulcer of other part of left foot with fat layer exposed: Secondary | ICD-10-CM

## 2017-04-17 DIAGNOSIS — Z89412 Acquired absence of left great toe: Secondary | ICD-10-CM

## 2017-04-17 NOTE — Progress Notes (Signed)
Patient came in today for fitting and eval for diabetic shoes: Patient' doctor here is Logan BoresEvans, PCP is N[place], [place]  Patient presents with DM2, PN, Amp #1 L  Patient was measured with brannock device and cast in foam for custom inserts.  8M...  Patient chose NB 990  L5000 x 1 L A5513 x 3 R A5500 x 2

## 2017-04-30 ENCOUNTER — Ambulatory Visit (INDEPENDENT_AMBULATORY_CARE_PROVIDER_SITE_OTHER): Payer: Medicare Other | Admitting: Podiatry

## 2017-04-30 ENCOUNTER — Encounter: Payer: Self-pay | Admitting: Podiatry

## 2017-04-30 DIAGNOSIS — B351 Tinea unguium: Secondary | ICD-10-CM | POA: Diagnosis not present

## 2017-04-30 DIAGNOSIS — Z89412 Acquired absence of left great toe: Secondary | ICD-10-CM

## 2017-04-30 DIAGNOSIS — E0842 Diabetes mellitus due to underlying condition with diabetic polyneuropathy: Secondary | ICD-10-CM

## 2017-04-30 DIAGNOSIS — L97522 Non-pressure chronic ulcer of other part of left foot with fat layer exposed: Secondary | ICD-10-CM

## 2017-04-30 MED ORDER — HYDROCODONE-ACETAMINOPHEN 7.5-325 MG PO TABS: 1.0000 | ORAL_TABLET | Freq: Four times a day (QID) | ORAL | 0 refills | Status: DC

## 2017-05-06 NOTE — Progress Notes (Signed)
   Subjective:  Patient with a history of uncontrolled diabetes mellitus presents today to the office for follow-up evaluation regarding history of a partial first ray amputation. Patient states that he is doing much better. He still has some residual pain. Patient also states that his nails are tender, elongated, and thickened. Patient presents today for further treatment and evaluation   Objective/Physical Exam General: The patient is alert and oriented x3 in no acute distress.  Dermatology:  Wound #1 noted to the first ray amputation stump left foot has healed. Complete reepithelialization has occurred. No open wounds or lesions noted. Skin is warm, dry and supple bilateral lower extremities.  Vascular: Palpable pedal pulses bilaterally. No edema or erythema noted. Capillary refill within normal limits.  Neurological: Epicritic and protective threshold absent bilaterally.   Musculoskeletal Exam: Range of motion within normal limits to all pedal and ankle joints bilateral. Muscle strength 5/5 in all groups bilateral.  history of partial first ray amputation left foot   Assessment: #1diabetes mellitus w/ peripheral neuropathy #2 history of partial first ray amputation left foot   Plan of Care:  #1 Patient was evaluated. #2 mechanical debridement of nails 1-5 bilaterally was performed using a nail nipper without incident or bleeding.   #3 the patient has an appointment with Raiford Noble, Pedorthist in 2 weeks to receive his diabetic shoes with Plastizote inserts #4 refill prescription for pain medication Vicodin 5/325 mg #5 return to clinic  Felecia Shelling, DPM [facility]  Dr. Felecia Shelling, DPM    [address]                                        [place], [place] [address]                Office [phone]  Fax [phone]

## 2017-05-15 ENCOUNTER — Ambulatory Visit: Payer: Medicare Other | Admitting: Orthotics

## 2017-05-22 ENCOUNTER — Ambulatory Visit: Payer: Medicare Other | Admitting: Orthotics

## 2017-05-31 ENCOUNTER — Telehealth: Payer: Self-pay | Admitting: Podiatry

## 2017-05-31 NOTE — Telephone Encounter (Signed)
I was calling about the status of my shoes. I have not heard anything from [facility]. Please call me back.

## 2017-05-31 NOTE — Telephone Encounter (Signed)
Returned call to patient and informed that diabetic shoes are in office.   He will schedule appt with Raiford Noble

## 2017-06-05 ENCOUNTER — Ambulatory Visit (INDEPENDENT_AMBULATORY_CARE_PROVIDER_SITE_OTHER): Payer: Medicare Other | Admitting: Orthotics

## 2017-06-05 DIAGNOSIS — L97524 Non-pressure chronic ulcer of other part of left foot with necrosis of bone: Secondary | ICD-10-CM

## 2017-06-05 DIAGNOSIS — E0842 Diabetes mellitus due to underlying condition with diabetic polyneuropathy: Secondary | ICD-10-CM | POA: Diagnosis not present

## 2017-06-05 DIAGNOSIS — L97523 Non-pressure chronic ulcer of other part of left foot with necrosis of muscle: Secondary | ICD-10-CM

## 2017-06-05 DIAGNOSIS — M86172 Other acute osteomyelitis, left ankle and foot: Secondary | ICD-10-CM

## 2017-06-05 DIAGNOSIS — L97522 Non-pressure chronic ulcer of other part of left foot with fat layer exposed: Secondary | ICD-10-CM

## 2017-06-05 DIAGNOSIS — Z89412 Acquired absence of left great toe: Secondary | ICD-10-CM | POA: Diagnosis not present

## 2017-06-05 DIAGNOSIS — A480 Gas gangrene: Secondary | ICD-10-CM

## 2017-06-05 NOTE — Progress Notes (Signed)
Patient came in today to pick up diabetic shoes and custom inserts with hallux filler left. .  Same was well pleased with fit and function.   The patient could ambulate without any discomfort; there were no signs of any quality issues. The foot ortheses offered full contact with plantar surface and contoured the arch well.   The shoes fit well with no heel slippage and areas of pressure concern.   Patient advised to contact us if any problems arise.  Patient also advised on how to report any issues.

## 2017-06-11 ENCOUNTER — Ambulatory Visit (INDEPENDENT_AMBULATORY_CARE_PROVIDER_SITE_OTHER): Payer: Medicare Other | Admitting: Podiatry

## 2017-06-11 ENCOUNTER — Encounter: Payer: Self-pay | Admitting: Podiatry

## 2017-06-11 DIAGNOSIS — I70245 Atherosclerosis of native arteries of left leg with ulceration of other part of foot: Secondary | ICD-10-CM

## 2017-06-11 DIAGNOSIS — E0842 Diabetes mellitus due to underlying condition with diabetic polyneuropathy: Secondary | ICD-10-CM | POA: Diagnosis not present

## 2017-06-11 DIAGNOSIS — L97522 Non-pressure chronic ulcer of other part of left foot with fat layer exposed: Secondary | ICD-10-CM

## 2017-06-11 MED ORDER — GENTAMICIN SULFATE 0.1 % EX CREA: 1.0000 "application " | TOPICAL_CREAM | Freq: Three times a day (TID) | CUTANEOUS | 1 refills | Status: DC

## 2017-06-12 NOTE — Progress Notes (Signed)
   Subjective:  Patient with a history of diabetes mellitus presents today for evaluation of a blister to the amputation site of the left lower extremity that began last week. He reports associated soreness of the area. He has been applying silver alginate given to him by his sister. There are no modifying factors noted. Patient presents today for further treatment and evaluation.   Objective/Physical Exam General: The patient is alert and oriented x3 in no acute distress.  Dermatology:  Wound #1 noted to the left foot measuring 2.5 x 4.0 x 0.2 cm (LxWxD).   To the noted ulceration(s), there is no eschar. There is a moderate amount of slough, fibrin, and necrotic tissue noted. Granulation tissue and wound base is red. There is a minimal amount of serosanguineous drainage noted. There is no exposed bone muscle-tendon ligament or joint. There is no malodor. Periwound integrity is intact. Skin is warm, dry and supple bilateral lower extremities.  Vascular: Palpable pedal pulses bilaterally. No edema or erythema noted. Capillary refill within normal limits.  Neurological: Epicritic and protective threshold absent bilaterally.   Musculoskeletal Exam: Range of motion within normal limits to all pedal and ankle joints bilateral. Muscle strength 5/5 in all groups bilateral.   Assessment: #1 left foot ulceration secondary to diabetes mellitus #2 diabetes mellitus w/ peripheral neuropathy   Plan of Care:  #1 Patient was evaluated. #2 medically necessary excisional debridement including subcutaneous tissue was performed using a tissue nipper and a chisel blade. Excisional debridement of all the necrotic nonviable tissue down to healthy bleeding viable tissue was performed with post-debridement measurements same as pre-. #3 the wound was cleansed and dry sterile dressing applied. #4 prescription for gentamicin cream given to patient. Recommended daily dressing changes at home. #5 patient is to  return to clinic in 2 weeks.   Felecia Shelling, DPM [facility]  Dr. Felecia Shelling, DPM    [address]                                        [place], [place] [address]                Office [phone]  Fax [phone]

## 2017-06-14 ENCOUNTER — Other Ambulatory Visit: Payer: Self-pay | Admitting: "Endocrinology

## 2017-06-25 ENCOUNTER — Encounter: Payer: Self-pay | Admitting: Podiatry

## 2017-06-25 ENCOUNTER — Ambulatory Visit (INDEPENDENT_AMBULATORY_CARE_PROVIDER_SITE_OTHER): Payer: Medicare Other | Admitting: Podiatry

## 2017-06-25 DIAGNOSIS — E0842 Diabetes mellitus due to underlying condition with diabetic polyneuropathy: Secondary | ICD-10-CM | POA: Diagnosis not present

## 2017-06-25 DIAGNOSIS — L97522 Non-pressure chronic ulcer of other part of left foot with fat layer exposed: Secondary | ICD-10-CM

## 2017-06-25 DIAGNOSIS — I70245 Atherosclerosis of native arteries of left leg with ulceration of other part of foot: Secondary | ICD-10-CM

## 2017-06-25 MED ORDER — HYDROCODONE-ACETAMINOPHEN 7.5-325 MG PO TABS: 1.0000 | ORAL_TABLET | Freq: Four times a day (QID) | ORAL | 0 refills | Status: DC

## 2017-06-28 NOTE — Progress Notes (Signed)
   Subjective:  Patient with a history of diabetes mellitus presents today for follow up evaluation of an ulceration to the amputation site of the left lower extremity. He states the wound has not changed but reports a decrease in drainage. He has been applying gentamycin cream to the area. Patient presents today for further treatment and evaluation.   Past Medical History:  Diagnosis Date  . Alcoholic (HCC)   . Chronic pancreatitis (HCC)   . Diabetes mellitus   . GERD (gastroesophageal reflux disease)   . HTN (hypertension)       Objective/Physical Exam General: The patient is alert and oriented x3 in no acute distress.  Dermatology:  Wound #1 noted to the left foot measuring 1.3 x 1.3 x 0.1 cm (LxWxD).   To the noted ulceration(s), there is no eschar. There is a moderate amount of slough, fibrin, and necrotic tissue noted. Granulation tissue and wound base is red. There is a minimal amount of serosanguineous drainage noted. There is no exposed bone muscle-tendon ligament or joint. There is no malodor. Periwound integrity is intact. Skin is warm, dry and supple bilateral lower extremities.  Vascular: Palpable pedal pulses bilaterally. No edema or erythema noted. Capillary refill within normal limits.  Neurological: Epicritic and protective threshold absent bilaterally.   Musculoskeletal Exam: Range of motion within normal limits to all pedal and ankle joints bilateral. Muscle strength 5/5 in all groups bilateral.   Assessment: #1 left foot ulceration secondary to diabetes mellitus #2 diabetes mellitus w/ peripheral neuropathy   Plan of Care:  #1 Patient was evaluated. #2 medically necessary excisional debridement including subcutaneous tissue was performed using a tissue nipper and a chisel blade. Excisional debridement of all the necrotic nonviable tissue down to healthy bleeding viable tissue was performed with post-debridement measurements same as pre-. #3 the wound was  cleansed and dry sterile dressing applied. #4 Continue using gentamycin cream. #5 Offloading donut pads dispensed. #6 Refill prescription for Vicodin 7.5/325 mg #30 given to patient. #7 Return to clinic in 2 weeks.   Felecia Shelling, DPM [facility]  Dr. Felecia Shelling, DPM    [address]                                        [place], [place] [address]                Office [phone]  Fax [phone]

## 2017-07-05 ENCOUNTER — Other Ambulatory Visit: Payer: Self-pay | Admitting: "Endocrinology

## 2017-07-05 DIAGNOSIS — E1065 Type 1 diabetes mellitus with hyperglycemia: Secondary | ICD-10-CM | POA: Diagnosis not present

## 2017-07-05 DIAGNOSIS — E108 Type 1 diabetes mellitus with unspecified complications: Secondary | ICD-10-CM | POA: Diagnosis not present

## 2017-07-06 LAB — HEMOGLOBIN A1C W/OUT EAG: Hgb A1c MFr Bld: 11.2 % of total Hgb — ABNORMAL HIGH (ref <5.7)

## 2017-07-06 LAB — RENAL PROFILE WITH ESTIMATED GFR
Albumin: 4.3 g/dL (ref 3.6–5.1)
BUN: 14 mg/dL (ref 7–25)
CO2: 23 mmol/L (ref 20–32)
CREATININE: 0.84 mg/dL (ref 0.60–1.35)
Calcium: 9.7 mg/dL (ref 8.6–10.3)
Chloride: 97 mmol/L — ABNORMAL LOW (ref 98–110)
GFR, EST NON AFRICAN AMERICAN: 104 mL/min/{1.73_m2} (ref >=60)
GFR, Est African American: 121 mL/min/{1.73_m2} (ref >=60)
GLUCOSE: 274 mg/dL — AB (ref 65–99)
POTASSIUM: 5 mmol/L (ref 3.5–5.3)
Phosphorus: 3.4 mg/dL (ref 2.5–4.5)
SODIUM: 134 mmol/L — AB (ref 135–146)

## 2017-07-09 ENCOUNTER — Ambulatory Visit (INDEPENDENT_AMBULATORY_CARE_PROVIDER_SITE_OTHER): Payer: Medicare Other | Admitting: "Endocrinology

## 2017-07-09 ENCOUNTER — Encounter: Payer: Self-pay | Admitting: "Endocrinology

## 2017-07-09 ENCOUNTER — Ambulatory Visit: Payer: Medicare Other | Admitting: Podiatry

## 2017-07-09 VITALS — BP 134/82 | HR 84 | Ht 73.0 in | Wt 155.0 lb

## 2017-07-09 DIAGNOSIS — E1065 Type 1 diabetes mellitus with hyperglycemia: Secondary | ICD-10-CM | POA: Diagnosis not present

## 2017-07-09 DIAGNOSIS — K8681 Exocrine pancreatic insufficiency: Secondary | ICD-10-CM | POA: Diagnosis not present

## 2017-07-09 DIAGNOSIS — I10 Essential (primary) hypertension: Secondary | ICD-10-CM | POA: Diagnosis not present

## 2017-07-09 DIAGNOSIS — I70245 Atherosclerosis of native arteries of left leg with ulceration of other part of foot: Secondary | ICD-10-CM | POA: Diagnosis not present

## 2017-07-09 DIAGNOSIS — E108 Type 1 diabetes mellitus with unspecified complications: Secondary | ICD-10-CM | POA: Diagnosis not present

## 2017-07-09 DIAGNOSIS — IMO0002 Reserved for concepts with insufficient information to code with codable children: Secondary | ICD-10-CM

## 2017-07-09 MED ORDER — INSULIN ASPART PROT & ASPART (70-30 MIX) 100 UNIT/ML PEN: PEN_INJECTOR | SUBCUTANEOUS | 0 refills | Status: DC
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
## 2017-07-12 ENCOUNTER — Ambulatory Visit (INDEPENDENT_AMBULATORY_CARE_PROVIDER_SITE_OTHER): Payer: Medicare Other | Admitting: Podiatry

## 2017-07-12 ENCOUNTER — Encounter: Payer: Self-pay | Admitting: Podiatry

## 2017-07-12 DIAGNOSIS — I70245 Atherosclerosis of native arteries of left leg with ulceration of other part of foot: Secondary | ICD-10-CM

## 2017-07-12 DIAGNOSIS — L97522 Non-pressure chronic ulcer of other part of left foot with fat layer exposed: Secondary | ICD-10-CM

## 2017-07-12 DIAGNOSIS — L089 Local infection of the skin and subcutaneous tissue, unspecified: Secondary | ICD-10-CM | POA: Diagnosis not present

## 2017-07-12 DIAGNOSIS — E0842 Diabetes mellitus due to underlying condition with diabetic polyneuropathy: Secondary | ICD-10-CM

## 2017-07-12 DIAGNOSIS — E11628 Type 2 diabetes mellitus with other skin complications: Secondary | ICD-10-CM

## 2017-07-12 MED ORDER — HYDROCODONE-ACETAMINOPHEN 7.5-325 MG PO TABS: 1.0000 | ORAL_TABLET | Freq: Four times a day (QID) | ORAL | 0 refills | Status: DC

## 2017-07-12 MED ORDER — SULFAMETHOXAZOLE-TRIMETHOPRIM 400-80 MG PO TABS: 1.0000 | ORAL_TABLET | Freq: Two times a day (BID) | ORAL | 0 refills | Status: DC

## 2017-07-14 NOTE — Progress Notes (Signed)
   Subjective:  Patient with a history of diabetes mellitus presents today for follow up evaluation of an ulceration to the amputation site of the left lower extremity. He reports some continued pain but states he is doing better overall. He rates the pain at 7-8/10. Patient presents today for further treatment and evaluation.   Past Medical History:  Diagnosis Date  . Alcoholic (HCC)   . Chronic pancreatitis (HCC)   . Diabetes mellitus   . GERD (gastroesophageal reflux disease)   . HTN (hypertension)       Objective/Physical Exam General: The patient is alert and oriented x3 in no acute distress.  Dermatology:  Wound #1 noted to the left foot measuring 1.5 x 1.1 x 0.1 cm (LxWxD).   To the noted ulceration(s), there is no eschar. There is a moderate amount of slough, fibrin, and necrotic tissue noted. Granulation tissue and wound base is red. There is a minimal amount of serosanguineous drainage noted. There is no exposed bone muscle-tendon ligament or joint. There is no malodor. Periwound integrity is intact. Skin is warm, dry and supple bilateral lower extremities.  Vascular: Palpable pedal pulses bilaterally. No edema or erythema noted. Capillary refill within normal limits.  Neurological: Epicritic and protective threshold absent bilaterally.   Musculoskeletal Exam: Range of motion within normal limits to all pedal and ankle joints bilateral. Muscle strength 5/5 in all groups bilateral.   Assessment: #1 left foot ulceration secondary to diabetes mellitus #2 diabetes mellitus w/ peripheral neuropathy   Plan of Care:  #1 Patient was evaluated. #2 medically necessary excisional debridement including subcutaneous tissue was performed using a tissue nipper and a chisel blade. Excisional debridement of all the necrotic nonviable tissue down to healthy bleeding viable tissue was performed with post-debridement measurements same as pre-. #3 the wound was cleansed and dry sterile  dressing applied. #4 cultures taken from wound.  #5 continue using gentamicin cream and home. #6 prescription for Bactrim DS #20 given to patient. #7 refill prescription for Vicodin given to patient. #8 return to clinic in 2 weeks.   Felecia ShellingBrent M. Braya Habermehl, DPM [facility]  Dr. Felecia ShellingBrent M. Faolan [place], DPM    [address]                                        [place], [place] [address]                Office [phone]  Fax [phone]

## 2017-07-17 LAB — WOUND CULTURE

## 2017-07-26 ENCOUNTER — Ambulatory Visit (INDEPENDENT_AMBULATORY_CARE_PROVIDER_SITE_OTHER): Payer: Medicare Other

## 2017-07-26 ENCOUNTER — Ambulatory Visit (INDEPENDENT_AMBULATORY_CARE_PROVIDER_SITE_OTHER): Payer: Medicare Other | Admitting: Podiatry

## 2017-07-26 ENCOUNTER — Encounter: Payer: Self-pay | Admitting: Podiatry

## 2017-07-26 DIAGNOSIS — I70245 Atherosclerosis of native arteries of left leg with ulceration of other part of foot: Secondary | ICD-10-CM

## 2017-07-26 DIAGNOSIS — L97522 Non-pressure chronic ulcer of other part of left foot with fat layer exposed: Secondary | ICD-10-CM

## 2017-07-26 DIAGNOSIS — E0842 Diabetes mellitus due to underlying condition with diabetic polyneuropathy: Secondary | ICD-10-CM

## 2017-07-26 MED ORDER — HYDROCODONE-ACETAMINOPHEN 7.5-325 MG PO TABS: 1.0000 | ORAL_TABLET | Freq: Four times a day (QID) | ORAL | 0 refills | Status: DC

## 2017-07-26 MED ORDER — SULFAMETHOXAZOLE-TRIMETHOPRIM 400-80 MG PO TABS: 1.0000 | ORAL_TABLET | Freq: Two times a day (BID) | ORAL | 0 refills | Status: DC

## 2017-07-29 NOTE — Progress Notes (Signed)
   Subjective:  Patient with a history of diabetes mellitus presents today for follow up evaluation of an ulceration to the amputation site of the left lower extremity. He states his condition is relatively unchanged. He reports continued pain rating it 7/10 at this time. Patient presents today for further treatment and evaluation.   Past Medical History:  Diagnosis Date  . Alcoholic (HCC)   . Chronic pancreatitis (HCC)   . Diabetes mellitus   . GERD (gastroesophageal reflux disease)   . HTN (hypertension)       Objective/Physical Exam General: The patient is alert and oriented x3 in no acute distress.  Dermatology:  Wound #1 noted to the left foot measuring 1.2 x 1.2 x 0.2 cm (LxWxD).   To the noted ulceration(s), there is no eschar. There is a moderate amount of slough, fibrin, and necrotic tissue noted. Granulation tissue and wound base is red. There is a minimal amount of serosanguineous drainage noted. There is no exposed bone muscle-tendon ligament or joint. There is no malodor. Periwound integrity is intact. Skin is warm, dry and supple bilateral lower extremities.  Vascular: Palpable pedal pulses bilaterally. No edema or erythema noted. Capillary refill within normal limits.  Neurological: Epicritic and protective threshold absent bilaterally.   Musculoskeletal Exam: Range of motion within normal limits to all pedal and ankle joints bilateral. Muscle strength 5/5 in all groups bilateral.   Radiographic Exam: Normal osseous mineralization. Joint spaces preserved. No fracture/dislocation/boney destruction.   Assessment: #1 left foot ulceration secondary to diabetes mellitus #2 diabetes mellitus w/ peripheral neuropathy   Plan of Care:  #1 Patient was evaluated. #2 medically necessary excisional debridement including subcutaneous tissue was performed using a tissue nipper and a chisel blade. Excisional debridement of all the necrotic nonviable tissue down to healthy  bleeding viable tissue was performed with post-debridement measurements same as pre-. #3 the wound was cleansed and dry sterile dressing applied. #4 Continue using Gentamicin cream daily with a dry sterile dressing.  #5 Refill prescription for Bactrim DS given to patient.  #6 Refill prescription for Vicodin given to patient.  #7 Return to clinic in 3 weeks.   [name], DPM [facility]  Dr. Felecia ShellingBrent M. Newton [name], DPM    [address]                                        [place], [place] [address]                Office [phone]  Fax [phone]

## 2017-08-16 ENCOUNTER — Other Ambulatory Visit: Payer: Self-pay | Admitting: "Endocrinology

## 2017-08-16 ENCOUNTER — Ambulatory Visit (INDEPENDENT_AMBULATORY_CARE_PROVIDER_SITE_OTHER): Payer: Medicare Other | Admitting: Podiatry

## 2017-08-16 ENCOUNTER — Encounter: Payer: Self-pay | Admitting: Podiatry

## 2017-08-16 DIAGNOSIS — L97522 Non-pressure chronic ulcer of other part of left foot with fat layer exposed: Secondary | ICD-10-CM

## 2017-08-16 DIAGNOSIS — E0842 Diabetes mellitus due to underlying condition with diabetic polyneuropathy: Secondary | ICD-10-CM

## 2017-08-16 DIAGNOSIS — I70245 Atherosclerosis of native arteries of left leg with ulceration of other part of foot: Secondary | ICD-10-CM

## 2017-08-16 MED ORDER — CIPROFLOXACIN HCL 500 MG PO TABS: 500.0000 mg | ORAL_TABLET | Freq: Two times a day (BID) | ORAL | 0 refills | Status: DC

## 2017-08-16 MED ORDER — HYDROCODONE-ACETAMINOPHEN 7.5-325 MG PO TABS: 1.0000 | ORAL_TABLET | Freq: Four times a day (QID) | ORAL | 0 refills | Status: DC

## 2017-08-16 MED ORDER — SULFAMETHOXAZOLE-TRIMETHOPRIM 400-80 MG PO TABS: 1.0000 | ORAL_TABLET | Freq: Two times a day (BID) | ORAL | 0 refills | Status: DC

## 2017-08-16 NOTE — Progress Notes (Signed)
   Subjective:  46 year old male with a history of diabetes mellitus and previous amputation to the left great toe appears today for follow-up evaluation regarding an ulcer to the subsecond MPJ of the left foot.  Patient states that he is noticed in odor and the ulcer continues to be somewhat painful.  He presents today for further treatment and evaluation.   Objective/Physical Exam General: The patient is alert and oriented x3 in no acute distress.  Dermatology:  Wound #1 noted to the plantar aspect of the subsecond MPJ left foot measuring approximately 3.5 x 3.5 x 2.0 cm (LxWxD).   To the noted ulceration(s), there is no eschar.  There is a heavy amount of slough fibrin and necrotic tissue noted.  Purulent drainage also noted upon debridement to the deep compartments of the foot.  There is a strong malodor.  There is exposed joint.  At the moment there does not appear to be any exposed bone.  Minimal granulation tissue noted.  Wound base is mostly fibrotic with purulent drainage.  Vascular: Palpable pedal pulses bilaterally. No edema or erythema noted. Capillary refill within normal limits.  Neurological: Epicritic and protective threshold absent bilaterally.   Musculoskeletal Exam: Range of motion within normal limits to all pedal and ankle joints bilateral. Muscle strength 5/5 in all groups bilateral.  History of partial first ray amputation left foot  Assessment: #1  Ulcer subsecond MPJ left foot secondary to diabetes mellitus #2  Acute cellulitis left foot  #3 diabetes mellitus w/ peripheral neuropathy   Plan of Care:  #1 Patient was evaluated. #2 medically necessary excisional debridement including muscle and deep fascial tissue was performed using a tissue nipper and a chisel blade. Excisional debridement of all the necrotic nonviable tissue down to healthy bleeding viable tissue was performed with post-debridement measurements same as pre-. #3 the wound was cleansed and dry  sterile dressing applied with Betadine wet-to-dry dressing. #4  Refill prescription for Vicodin provided #5 prescription for ciprofloxacin 500 mg x 2 weeks #6 prescription for Bactrim DS x 2 weeks #7 I had a conversation with the patient regarding the seriousness of this infection.  Recommend that the patient immediately go to the emergency department this weekend if not tonight, to be evaluated for possible IV antibiotics, incision and drainage surgically, and possible transmetatarsal amputation of the left foot.  The patient [facility] close to [facility], and he states understanding that he will go there this weekend. In the meantime continue oral antibiotics as prescribed #8 return to clinic post discharge    Felecia ShellingBrent M. Myron Lona, DPM [facility]  Dr. Felecia ShellingBrent M. Ariya Bohannon, DPM    [address]                                        [place], [place] [address]                Office [phone]  Fax [phone]

## 2017-08-19 ENCOUNTER — Other Ambulatory Visit: Payer: Self-pay

## 2017-08-19 ENCOUNTER — Encounter (HOSPITAL_COMMUNITY): Payer: Self-pay | Admitting: Cardiology

## 2017-08-19 ENCOUNTER — Inpatient Hospital Stay (HOSPITAL_COMMUNITY)
Admission: EM | Admit: 2017-08-19 | Discharge: 2017-08-24 | DRG: 617 | Disposition: A | Discharge status: Skilled Nursing Facility | Payer: Medicare Other | Attending: Family Medicine | Admitting: Family Medicine

## 2017-08-19 ENCOUNTER — Emergency Department (HOSPITAL_COMMUNITY): Payer: Medicare Other

## 2017-08-19 DIAGNOSIS — Z720 Tobacco use: Secondary | ICD-10-CM | POA: Diagnosis present

## 2017-08-19 DIAGNOSIS — S99922A Unspecified injury of left foot, initial encounter: Secondary | ICD-10-CM | POA: Diagnosis not present

## 2017-08-19 DIAGNOSIS — F1011 Alcohol abuse, in remission: Secondary | ICD-10-CM | POA: Diagnosis not present

## 2017-08-19 DIAGNOSIS — E13649 Other specified diabetes mellitus with hypoglycemia without coma: Secondary | ICD-10-CM | POA: Diagnosis not present

## 2017-08-19 DIAGNOSIS — E1369 Other specified diabetes mellitus with other specified complication: Secondary | ICD-10-CM | POA: Diagnosis present

## 2017-08-19 DIAGNOSIS — R2681 Unsteadiness on feet: Secondary | ICD-10-CM | POA: Diagnosis not present

## 2017-08-19 DIAGNOSIS — L97509 Non-pressure chronic ulcer of other part of unspecified foot with unspecified severity: Secondary | ICD-10-CM

## 2017-08-19 DIAGNOSIS — E876 Hypokalemia: Secondary | ICD-10-CM | POA: Diagnosis not present

## 2017-08-19 DIAGNOSIS — F1010 Alcohol abuse, uncomplicated: Secondary | ICD-10-CM | POA: Diagnosis present

## 2017-08-19 DIAGNOSIS — E1342 Other specified diabetes mellitus with diabetic polyneuropathy: Secondary | ICD-10-CM | POA: Diagnosis present

## 2017-08-19 DIAGNOSIS — E11621 Type 2 diabetes mellitus with foot ulcer: Secondary | ICD-10-CM | POA: Diagnosis present

## 2017-08-19 DIAGNOSIS — I10 Essential (primary) hypertension: Secondary | ICD-10-CM | POA: Diagnosis present

## 2017-08-19 DIAGNOSIS — E1169 Type 2 diabetes mellitus with other specified complication: Secondary | ICD-10-CM

## 2017-08-19 DIAGNOSIS — M79672 Pain in left foot: Secondary | ICD-10-CM

## 2017-08-19 DIAGNOSIS — L03116 Cellulitis of left lower limb: Secondary | ICD-10-CM | POA: Diagnosis not present

## 2017-08-19 DIAGNOSIS — E1365 Other specified diabetes mellitus with hyperglycemia: Secondary | ICD-10-CM | POA: Diagnosis not present

## 2017-08-19 DIAGNOSIS — F17210 Nicotine dependence, cigarettes, uncomplicated: Secondary | ICD-10-CM | POA: Diagnosis present

## 2017-08-19 DIAGNOSIS — E119 Type 2 diabetes mellitus without complications: Secondary | ICD-10-CM | POA: Diagnosis not present

## 2017-08-19 DIAGNOSIS — E13621 Other specified diabetes mellitus with foot ulcer: Secondary | ICD-10-CM | POA: Diagnosis not present

## 2017-08-19 DIAGNOSIS — K219 Gastro-esophageal reflux disease without esophagitis: Secondary | ICD-10-CM | POA: Diagnosis present

## 2017-08-19 DIAGNOSIS — E871 Hypo-osmolality and hyponatremia: Secondary | ICD-10-CM | POA: Diagnosis present

## 2017-08-19 DIAGNOSIS — M869 Osteomyelitis, unspecified: Secondary | ICD-10-CM | POA: Diagnosis not present

## 2017-08-19 DIAGNOSIS — Z794 Long term (current) use of insulin: Secondary | ICD-10-CM | POA: Diagnosis not present

## 2017-08-19 DIAGNOSIS — K860 Alcohol-induced chronic pancreatitis: Secondary | ICD-10-CM | POA: Diagnosis not present

## 2017-08-19 DIAGNOSIS — E1351 Other specified diabetes mellitus with diabetic peripheral angiopathy without gangrene: Secondary | ICD-10-CM | POA: Diagnosis present

## 2017-08-19 DIAGNOSIS — Z801 Family history of malignant neoplasm of trachea, bronchus and lung: Secondary | ICD-10-CM

## 2017-08-19 DIAGNOSIS — Z833 Family history of diabetes mellitus: Secondary | ICD-10-CM | POA: Diagnosis not present

## 2017-08-19 DIAGNOSIS — L97429 Non-pressure chronic ulcer of left heel and midfoot with unspecified severity: Secondary | ICD-10-CM | POA: Diagnosis not present

## 2017-08-19 DIAGNOSIS — L97524 Non-pressure chronic ulcer of other part of left foot with necrosis of bone: Secondary | ICD-10-CM | POA: Diagnosis not present

## 2017-08-19 DIAGNOSIS — L97408 Non-pressure chronic ulcer of unspecified heel and midfoot with other specified severity: Secondary | ICD-10-CM | POA: Diagnosis not present

## 2017-08-19 DIAGNOSIS — Z7401 Bed confinement status: Secondary | ICD-10-CM | POA: Diagnosis not present

## 2017-08-19 DIAGNOSIS — M6281 Muscle weakness (generalized): Secondary | ICD-10-CM | POA: Diagnosis not present

## 2017-08-19 DIAGNOSIS — Z89412 Acquired absence of left great toe: Secondary | ICD-10-CM | POA: Diagnosis not present

## 2017-08-19 DIAGNOSIS — E1142 Type 2 diabetes mellitus with diabetic polyneuropathy: Secondary | ICD-10-CM | POA: Diagnosis not present

## 2017-08-19 DIAGNOSIS — L97529 Non-pressure chronic ulcer of other part of left foot with unspecified severity: Secondary | ICD-10-CM

## 2017-08-19 DIAGNOSIS — R279 Unspecified lack of coordination: Secondary | ICD-10-CM | POA: Diagnosis not present

## 2017-08-19 DIAGNOSIS — Z23 Encounter for immunization: Secondary | ICD-10-CM

## 2017-08-19 DIAGNOSIS — M86172 Other acute osteomyelitis, left ankle and foot: Secondary | ICD-10-CM | POA: Diagnosis not present

## 2017-08-19 DIAGNOSIS — Z89421 Acquired absence of other right toe(s): Secondary | ICD-10-CM | POA: Diagnosis not present

## 2017-08-19 DIAGNOSIS — Z89422 Acquired absence of other left toe(s): Secondary | ICD-10-CM | POA: Diagnosis not present

## 2017-08-19 DIAGNOSIS — Z89432 Acquired absence of left foot: Secondary | ICD-10-CM

## 2017-08-19 LAB — CBG MONITORING, ED: Glucose-Capillary: 263 mg/dL — ABNORMAL HIGH (ref 65–99)

## 2017-08-19 LAB — CBC WITH DIFFERENTIAL/PLATELET
BASOS PCT: 0 %
Basophils Absolute: 0 10*3/uL (ref 0.0–0.1)
EOS ABS: 0 10*3/uL (ref 0.0–0.7)
EOS PCT: 0 %
HCT: 38.6 % — ABNORMAL LOW (ref 39.0–52.0)
Hemoglobin: 13.1 g/dL (ref 13.0–17.0)
Lymphocytes Relative: 12 %
Lymphs Abs: 2.1 10*3/uL (ref 0.7–4.0)
MCH: 33.9 pg (ref 26.0–34.0)
MCHC: 33.9 g/dL (ref 30.0–36.0)
MCV: 99.7 fL (ref 78.0–100.0)
MONOS PCT: 17 %
Monocytes Absolute: 3 10*3/uL — ABNORMAL HIGH (ref 0.1–1.0)
NEUTROS PCT: 70 %
Neutro Abs: 12.2 10*3/uL — ABNORMAL HIGH (ref 1.7–7.7)
PLATELETS: 339 10*3/uL (ref 150–400)
RBC: 3.87 MIL/uL — AB (ref 4.22–5.81)
RDW: 11.8 % (ref 11.5–15.5)
WBC: 17.3 10*3/uL — AB (ref 4.0–10.5)

## 2017-08-19 LAB — BASIC METABOLIC PANEL
ANION GAP: 17 — AB (ref 5–15)
BUN: 9 mg/dL (ref 6–20)
CALCIUM: 9.5 mg/dL (ref 8.9–10.3)
CO2: 15 mmol/L — ABNORMAL LOW (ref 22–32)
CREATININE: 1.01 mg/dL (ref 0.61–1.24)
Chloride: 95 mmol/L — ABNORMAL LOW (ref 101–111)
GFR calc Af Amer: >60 mL/min (ref >60)
Glucose, Bld: 272 mg/dL — ABNORMAL HIGH (ref 65–99)
Potassium: 3.8 mmol/L (ref 3.5–5.1)
Sodium: 127 mmol/L — ABNORMAL LOW (ref 135–145)

## 2017-08-19 LAB — GLUCOSE, CAPILLARY: Glucose-Capillary: 181 mg/dL — ABNORMAL HIGH (ref 65–99)

## 2017-08-19 LAB — LACTIC ACID, PLASMA: Lactic Acid, Venous: 1.2 mmol/L (ref 0.5–1.9)

## 2017-08-19 LAB — MAGNESIUM: MAGNESIUM: 1.9 mg/dL (ref 1.7–2.4)

## 2017-08-19 LAB — SEDIMENTATION RATE: SED RATE: 125 mm/h — AB (ref 0–16)

## 2017-08-19 MED ORDER — SODIUM CHLORIDE 0.9 % IV BOLUS (SEPSIS)
1000.0000 mL | Freq: Once | INTRAVENOUS | Status: AC
  Administered 2017-08-19: 1000 mL via INTRAVENOUS

## 2017-08-19 MED ORDER — INSULIN ASPART PROT & ASPART (70-30 MIX) 100 UNIT/ML ~~LOC~~ SUSP
90.0000 [IU] | Freq: Every day | SUBCUTANEOUS | Status: DC
  Administered 2017-08-20: 90 [IU] via SUBCUTANEOUS
  Filled 2017-08-19: qty 10

## 2017-08-19 MED ORDER — DEXTROSE 5 % IV SOLN
2.0000 g | Freq: Once | INTRAVENOUS | Status: AC
  Administered 2017-08-19: 2 g via INTRAVENOUS
  Filled 2017-08-19: qty 2

## 2017-08-19 MED ORDER — ENOXAPARIN SODIUM 40 MG/0.4ML ~~LOC~~ SOLN
40.0000 mg | SUBCUTANEOUS | Status: DC
  Administered 2017-08-20 – 2017-08-21 (×2): 40 mg via SUBCUTANEOUS
  Filled 2017-08-19 (×3): qty 0.4

## 2017-08-19 MED ORDER — INSULIN ASPART PROT & ASPART (70-30 MIX) 100 UNIT/ML ~~LOC~~ SUSP
80.0000 [IU] | Freq: Every day | SUBCUTANEOUS | Status: DC
  Filled 2017-08-19: qty 10

## 2017-08-19 MED ORDER — METRONIDAZOLE IN NACL 5-0.79 MG/ML-% IV SOLN
500.0000 mg | Freq: Three times a day (TID) | INTRAVENOUS | Status: DC
  Administered 2017-08-19 – 2017-08-24 (×15): 500 mg via INTRAVENOUS
  Filled 2017-08-19 (×15): qty 100

## 2017-08-19 MED ORDER — KETOROLAC TROMETHAMINE 30 MG/ML IJ SOLN
30.0000 mg | Freq: Once | INTRAMUSCULAR | Status: AC
  Administered 2017-08-19: 30 mg via INTRAVENOUS
  Filled 2017-08-19: qty 1

## 2017-08-19 MED ORDER — INSULIN ASPART PROT & ASPART (70-30 MIX) 100 UNIT/ML PEN: 80.0000 [IU] | PEN_INJECTOR | Freq: Two times a day (BID) | SUBCUTANEOUS | Status: DC

## 2017-08-19 MED ORDER — HYDROCODONE-ACETAMINOPHEN 7.5-325 MG PO TABS: 1.0000 | ORAL_TABLET | ORAL | Status: DC | PRN

## 2017-08-19 MED ORDER — VANCOMYCIN HCL IN DEXTROSE 1-5 GM/200ML-% IV SOLN
1000.0000 mg | Freq: Three times a day (TID) | INTRAVENOUS | Status: DC
  Administered 2017-08-20 – 2017-08-24 (×13): 1000 mg via INTRAVENOUS
  Filled 2017-08-19 (×13): qty 200

## 2017-08-19 MED ORDER — PANCRELIPASE (LIP-PROT-AMYL) 12000-38000 UNITS PO CPEP
36000.0000 [IU] | ORAL_CAPSULE | Freq: Three times a day (TID) | ORAL | Status: DC
  Administered 2017-08-20 – 2017-08-24 (×17): 36000 [IU] via ORAL
  Filled 2017-08-19 (×2): qty 3
  Filled 2017-08-19 (×4): qty 1
  Filled 2017-08-19: qty 3
  Filled 2017-08-19: qty 1
  Filled 2017-08-19: qty 3
  Filled 2017-08-19: qty 1
  Filled 2017-08-19 (×2): qty 3
  Filled 2017-08-19: qty 1
  Filled 2017-08-19: qty 3
  Filled 2017-08-19: qty 1
  Filled 2017-08-19 (×3): qty 3

## 2017-08-19 MED ORDER — INSULIN ASPART 100 UNIT/ML ~~LOC~~ SOLN
0.0000 [IU] | Freq: Three times a day (TID) | SUBCUTANEOUS | Status: DC
  Administered 2017-08-20: 5 [IU] via SUBCUTANEOUS
  Administered 2017-08-20: 3 [IU] via SUBCUTANEOUS

## 2017-08-19 MED ORDER — VANCOMYCIN HCL IN DEXTROSE 1-5 GM/200ML-% IV SOLN
1000.0000 mg | Freq: Once | INTRAVENOUS | Status: AC
  Administered 2017-08-19: 1000 mg via INTRAVENOUS
  Filled 2017-08-19: qty 200

## 2017-08-19 MED ORDER — DEXTROSE 5 % IV SOLN
2.0000 g | Freq: Two times a day (BID) | INTRAVENOUS | Status: DC
  Administered 2017-08-20 – 2017-08-21 (×4): 2 g via INTRAVENOUS
  Filled 2017-08-19 (×6): qty 2

## 2017-08-19 MED ORDER — PNEUMOCOCCAL VAC POLYVALENT 25 MCG/0.5ML IJ INJ
0.5000 mL | INJECTION | INTRAMUSCULAR | Status: AC
  Administered 2017-08-20: 0.5 mL via INTRAMUSCULAR
  Filled 2017-08-19: qty 0.5

## 2017-08-19 MED ORDER — INSULIN ASPART 100 UNIT/ML ~~LOC~~ SOLN
4.0000 [IU] | Freq: Once | SUBCUTANEOUS | Status: AC
  Administered 2017-08-19: 4 [IU] via SUBCUTANEOUS
  Filled 2017-08-19: qty 1

## 2017-08-19 MED ORDER — INFLUENZA VAC SPLIT QUAD 0.5 ML IM SUSY
0.5000 mL | PREFILLED_SYRINGE | INTRAMUSCULAR | Status: AC
Start: 2017-08-20 — End: 2017-08-20
  Administered 2017-08-20: 0.5 mL via INTRAMUSCULAR
  Filled 2017-08-19: qty 0.5

## 2017-08-19 NOTE — ED Triage Notes (Signed)
Left foot infection.  Pt states he has had a toe amputation  To same foot.  Foot doctor told him to come to the ER for antibiodics.
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
## 2017-08-19 NOTE — Progress Notes (Signed)
Returned call at 7:19 PM.  Discussed case with Dr. Clarene DukeMcManus.  MRI pending.  I will see the patient tomorrow AM.  Call [phone] with questions.
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
## 2017-08-19 NOTE — Progress Notes (Addendum)
Pharmacy Note:  Initial antibiotics for Vancomycin and Cefepime ordered by EDP for Diabetic Foot.  Estimated Creatinine Clearance: 89.9 mL/min (by C-G formula based on SCr of 1.01 mg/dL).   No Known Allergies  Vitals:   08/19/17 1434 08/19/17 1907  BP: (!) 147/85 (!) 153/94  Pulse: (!) 111 (!) 103  Resp: 20 18  Temp: 98.5 F (36.9 C)   SpO2: 98% 100%    Anti-infectives (From admission, onward)   Start     Dose/Rate Route Frequency Ordered Stop   08/19/17 2000  vancomycin (VANCOCIN) IVPB 1000 mg/200 mL premix     1,000 mg 200 mL/hr over 60 Minutes Intravenous  Once 08/19/17 1955     08/19/17 2000  ceFEPIme (MAXIPIME) 2 g in dextrose 5 % 50 mL IVPB     2 g 100 mL/hr over 30 Minutes Intravenous  Once 08/19/17 1955     08/19/17 1930  metroNIDAZOLE (FLAGYL) IVPB 500 mg     500 mg 100 mL/hr over 60 Minutes Intravenous Every 8 hours 08/19/17 1929       Plan: Initial doses of Vancomycin and Cefepime X 1 ordered. F/U admission orders for further dosing if therapy continued.  Mady GemmaHayes, Falicity Sheets R, [facility] FRPH 08/19/2017 7:55 PM   Addendum: Appears patient to be admitted. Vancomycin 1gm IV every 8 hours. Cefepime 2gm IV every 12 hours. Monitor labs, micro and vitals.  Mady GemmaHayes, Fama Muenchow R, [facility] 08/19/2017 8:56 PM

## 2017-08-20 ENCOUNTER — Inpatient Hospital Stay (HOSPITAL_COMMUNITY): Payer: Medicare Other

## 2017-08-20 DIAGNOSIS — M86172 Other acute osteomyelitis, left ankle and foot: Secondary | ICD-10-CM

## 2017-08-20 LAB — BASIC METABOLIC PANEL
Anion gap: 12 (ref 5–15)
BUN: 9 mg/dL (ref 6–20)
CO2: 17 mmol/L — ABNORMAL LOW (ref 22–32)
CREATININE: 0.78 mg/dL (ref 0.61–1.24)
Calcium: 9.2 mg/dL (ref 8.9–10.3)
Chloride: 101 mmol/L (ref 101–111)
GFR calc Af Amer: >60 mL/min (ref >60)
GLUCOSE: 184 mg/dL — AB (ref 65–99)
POTASSIUM: 3.8 mmol/L (ref 3.5–5.1)
SODIUM: 130 mmol/L — AB (ref 135–145)

## 2017-08-20 LAB — HEMOGLOBIN A1C
Hgb A1c MFr Bld: 10 % — ABNORMAL HIGH (ref 4.8–5.6)
Mean Plasma Glucose: 240.3 mg/dL

## 2017-08-20 LAB — CBC WITH DIFFERENTIAL/PLATELET
Basophils Absolute: 0 10*3/uL (ref 0.0–0.1)
Basophils Relative: 0 %
EOS ABS: 0.1 10*3/uL (ref 0.0–0.7)
EOS PCT: 1 %
HCT: 35.8 % — ABNORMAL LOW (ref 39.0–52.0)
Hemoglobin: 12 g/dL — ABNORMAL LOW (ref 13.0–17.0)
LYMPHS ABS: 2.2 10*3/uL (ref 0.7–4.0)
Lymphocytes Relative: 16 %
MCH: 33.3 pg (ref 26.0–34.0)
MCHC: 33.5 g/dL (ref 30.0–36.0)
MCV: 99.4 fL (ref 78.0–100.0)
MONOS PCT: 18 %
Monocytes Absolute: 2.4 10*3/uL — ABNORMAL HIGH (ref 0.1–1.0)
Neutro Abs: 8.7 10*3/uL — ABNORMAL HIGH (ref 1.7–7.7)
Neutrophils Relative %: 65 %
PLATELETS: 307 10*3/uL (ref 150–400)
RBC: 3.6 MIL/uL — ABNORMAL LOW (ref 4.22–5.81)
RDW: 11.7 % (ref 11.5–15.5)
WBC: 13.3 10*3/uL — ABNORMAL HIGH (ref 4.0–10.5)

## 2017-08-20 LAB — GLUCOSE, CAPILLARY
GLUCOSE-CAPILLARY: 151 mg/dL — AB (ref 65–99)
GLUCOSE-CAPILLARY: 119 mg/dL — AB (ref 65–99)
Glucose-Capillary: 245 mg/dL — ABNORMAL HIGH (ref 65–99)
Glucose-Capillary: 90 mg/dL (ref 65–99)
Glucose-Capillary: 126 mg/dL — ABNORMAL HIGH (ref 65–99)

## 2017-08-20 LAB — PREALBUMIN

## 2017-08-20 LAB — C-REACTIVE PROTEIN: CRP: 22.3 mg/dL — AB (ref <1.0)

## 2017-08-20 MED ORDER — MAGNESIUM SULFATE 2 GM/50ML IV SOLN
2.0000 g | Freq: Once | INTRAVENOUS | Status: AC
  Administered 2017-08-20: 2 g via INTRAVENOUS
  Filled 2017-08-20: qty 50

## 2017-08-20 MED ORDER — LORAZEPAM 2 MG/ML IJ SOLN
0.0000 mg | Freq: Two times a day (BID) | INTRAMUSCULAR | Status: AC
  Filled 2017-08-20: qty 1

## 2017-08-20 MED ORDER — THIAMINE HCL 100 MG/ML IJ SOLN
100.0000 mg | Freq: Every day | INTRAMUSCULAR | Status: DC
  Filled 2017-08-20: qty 2

## 2017-08-20 MED ORDER — HYDROCODONE-ACETAMINOPHEN 7.5-325 MG PO TABS
1.0000 | ORAL_TABLET | Freq: Four times a day (QID) | ORAL | Status: DC | PRN
  Administered 2017-08-20 – 2017-08-24 (×12): 1 via ORAL
  Filled 2017-08-20 (×12): qty 1

## 2017-08-20 MED ORDER — LORAZEPAM 2 MG/ML IJ SOLN: 1.0000 mg | Freq: Four times a day (QID) | INTRAMUSCULAR | Status: AC | PRN

## 2017-08-20 MED ORDER — ADULT MULTIVITAMIN W/MINERALS CH
1.0000 | ORAL_TABLET | Freq: Every day | ORAL | Status: DC
  Administered 2017-08-20 – 2017-08-24 (×4): 1 via ORAL
  Filled 2017-08-20 (×4): qty 1

## 2017-08-20 MED ORDER — INSULIN ASPART PROT & ASPART (70-30 MIX) 100 UNIT/ML ~~LOC~~ SUSP
60.0000 [IU] | Freq: Once | SUBCUTANEOUS | Status: AC
  Administered 2017-08-20: 60 [IU] via SUBCUTANEOUS

## 2017-08-20 MED ORDER — SODIUM CHLORIDE 0.9 % IV SOLN
INTRAVENOUS | Status: DC
  Administered 2017-08-20 (×2): via INTRAVENOUS

## 2017-08-20 MED ORDER — LORAZEPAM 2 MG/ML IJ SOLN
0.0000 mg | Freq: Four times a day (QID) | INTRAMUSCULAR | Status: AC
Start: 2017-08-20 — End: 2017-08-22
  Administered 2017-08-20: 2 mg via INTRAVENOUS
  Filled 2017-08-20 (×3): qty 1

## 2017-08-20 MED ORDER — JUVEN PO PACK
1.0000 | PACK | Freq: Two times a day (BID) | ORAL | Status: DC
  Administered 2017-08-20 – 2017-08-24 (×8): 1 via ORAL
  Filled 2017-08-20 (×14): qty 1

## 2017-08-20 MED ORDER — LORAZEPAM 1 MG PO TABS: 1.0000 mg | ORAL_TABLET | Freq: Four times a day (QID) | ORAL | Status: AC | PRN

## 2017-08-20 MED ORDER — PANTOPRAZOLE SODIUM 40 MG PO TBEC
40.0000 mg | DELAYED_RELEASE_TABLET | Freq: Every day | ORAL | Status: DC
  Administered 2017-08-20 – 2017-08-24 (×4): 40 mg via ORAL
  Filled 2017-08-20 (×4): qty 1

## 2017-08-20 MED ORDER — VITAMIN B-1 100 MG PO TABS
100.0000 mg | ORAL_TABLET | Freq: Every day | ORAL | Status: DC
  Administered 2017-08-20 – 2017-08-24 (×3): 100 mg via ORAL
  Filled 2017-08-20 (×4): qty 1

## 2017-08-20 MED ORDER — ENSURE ENLIVE PO LIQD
237.0000 mL | Freq: Two times a day (BID) | ORAL | Status: DC
  Administered 2017-08-21 – 2017-08-23 (×2): 237 mL via ORAL

## 2017-08-20 MED ORDER — FOLIC ACID 1 MG PO TABS
1.0000 mg | ORAL_TABLET | Freq: Every day | ORAL | Status: DC
  Administered 2017-08-20 – 2017-08-24 (×4): 1 mg via ORAL
  Filled 2017-08-20 (×4): qty 1

## 2017-08-20 NOTE — Care Management (Signed)
Case discussed with Dr. Nolen MuMckinney. Patient will likely need SNF at time of DC. CSW notified. Will sign off.

## 2017-08-20 NOTE — Clinical Social Work Note (Signed)
Received SW consult for access meds at dc. Notified RN CM, Sharley, as she will address this issue. CSW will be available as needed.

## 2017-08-20 NOTE — Progress Notes (Addendum)
Initial Nutrition Assessment  DOCUMENTATION CODES:      INTERVENTION:  Provided education to promote wound healing  Juven BID   Ensure Enlive po BID, each supplement provides 350 kcal and 20 grams of protein   RD will continue to follow   NUTRITION DIAGNOSIS:   Increased nutrient needs related to wound healing as evidenced by estimated needs and WOC assessment.    GOAL:  Pt to meet >/= 90% of their estimated nutrition needs     MONITOR:   PO intake, Supplement acceptance, Labs, Skin, Weight trends  REASON FOR ASSESSMENT:   Consult Wound healing  ASSESSMENT:  46 yo with diabetic wound. Patient has a hx of alcohol and tobacco abuse. He presents with osteomyelitis. He also has hx of chronic pancreatitis, HTN and GERD. S/p toe amputation left foot 02/18/17. Patient has weeping to 2nd and 3 rd left toes (complex soft tissue ulcer and full thickness wound to left plantar foot  per WOC note).   Diet hx: pt says he skips breakfast daily and usually 2 meals each day: a hot meal that includes a protein source such as Chicken, pork or fish at lunch and dinner. Provided education handout Promote Healing. Emphasized foods to consume such as quality protein sources, vitamin a and c as well as zinc.   Weight hx: decrease of 10 lb compared to last year same time frame and within 2 lb of his wt in October. Usual body wt has been ranging between 67-70 kg the past 6 months.  Recent Labs  Lab 08/19/17 1905 08/20/17 0446  NA 127* 130*  K 3.8 3.8  CL 95* 101  CO2 15* 17*  BUN 9 9  CREATININE 1.01 0.78  CALCIUM 9.5 9.2  MG 1.9  --   GLUCOSE 272* 184*   Labs: sodium 130, glu 184.  Meds: SSI, Creon TID and bedtime (36,000 units), MVI, PPI and thiamine.  NUTRITION - FOCUSED PHYSICAL EXAM:  Deferred at this time   Diet Order:  Diet heart healthy/carb modified Room service appropriate? Yes; Fluid consistency: Thin  EDUCATION NEEDS:  Education needs have been addressed(Provided  education handout Promote Healing. )   Skin:  Skin Assessment: Reviewed RN Assessment- moderate pitting edema left foot  Last BM:  12/4   Height:   Ht Readings from Last 1 Encounters:  08/19/17 6\' 1"  (1.854 m)    Weight:   Wt Readings from Last 1 Encounters:  08/19/17 152 lb 12.5 oz (69.3 kg)    Ideal Body Weight:  84 kg  BMI:  Body mass index is 20.16 kg/m.  Estimated Nutritional Needs:   Kcal:  2070-2277 (30-33 kcal/kg)  Protein:  90-103 gr (1.3-1.5 gr/kg)  Fluid:  2.1-2.2 liters daily   Royann ShiversLynn Marialena Wollen MS,RD,CSG,LDN Office: [phone] Pager: [phone]
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
## 2017-08-20 NOTE — Consult Note (Addendum)
WOC Nurse wound consult note Reason for Consult: Consult requested for foot wound.  WOC reveiwed photos in the EMR and MRI results, which indicate, "Soft tissue ulcer involving the second metatarsal head extending to the cortex with cortical destruction and marrow signal abnormality concerning for osteomyelitis."  This complex medical condition is beyond WOC scope of practice and pt may need a surgical procedure. Wound type: Full thickness wound to left plantar foot with blistering to left anterior foot.  Dressing procedure/placement/frequency: Moist gauze dressing orders provided for staff nurses to perform until further input is provided by the podiatry team. EMR indicates that pt will have ABIs in a.m and Podiatry will see him later in the morning or around noontime tomorrow. Please refer to their team for further questions. Please re-consult if further assistance is needed.  Thank-you,  Cammie Mcgeeawn Mikaela Hilgeman MSN, RN, CWOCN, [place], CNS [phone]

## 2017-08-20 NOTE — Progress Notes (Signed)
Inpatient Diabetes Program Recommendations  AACE/ADA: New Consensus Statement on Inpatient Glycemic Control (2015)  Target Ranges:  Prepandial:   less than 140 mg/dL      Peak postprandial:   less than 180 mg/dL (1-2 hours)      Critically ill patients:  140 - 180 mg/dL   Lab Results  Component Value Date   GLUCAP 245 (H) 08/20/2017   HGBA1C 11.2 (H) 07/05/2017   Review of Glycemic Control  Diabetes history: DM 1  Outpatient Diabetes medications: 70/30 90 units qam, 80 units qpm Current orders for Inpatient glycemic control: 70/30 90 units qam, 80 units qpm, Novolog Moderate Correction scale 0-15 units tid  Patient sees Dr. Fransico HimNida outpatient for DM management and last saw him 10/23. A1c 11.2% at that time. Dr. 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 Dr. Fransico HimNida reports that patient still continues to smoke and drink, does not want to check glucose levels that often due to this patient is on 70/30.   Dr. Fransico HimNida wants to avoid hypoglycemia in patient.   Current A1c in process.  Inpatient Diabetes Program Recommendations:    While inpatient consider decreasing home dose of 70/30 significantly. Last admission 02/2017 patient was only on 70/30 15 units BID.  If patient becomes NPO basal bolus would be more appropriate, Lantus 20 units with correciton scale and meal coverage when eating.  Thanks,  Christena DeemShannon Sanjna Haskew RN, MSN, [facility] Inpatient Diabetes Coordinator Team Pager [phone] (8a-5p)

## 2017-08-20 NOTE — Progress Notes (Signed)
Unstageable diabetic foot ulcer and blister to LEFT dorsal & plantar portions of the LEFT foot soaked and cleansed with mild soap. LEFT foot rinsed in sterile water, patted dry, ABD pads placed and LEFT foot wrapped in kerlix. Purulent drainage, odor, and slight bleeding noted. Patient tolerated well.

## 2017-08-20 NOTE — Progress Notes (Signed)
PROGRESS NOTE    [name]  ZOX:[id number] DOB: [birth year] DOA: 08/19/2017 PCP: [facility]    Brief Narrative:  46 year old male with a history of diabetes, presents with left foot wound infection.  Patient reports having chronic blister on the plantar aspect of his left foot for 3-4 months now.  This is been nonhealing.  He has taken antibiotics for this as an outpatient.  MRI imaging indicates possible underlying osteomyelitis.  He is on IV antibiotics.  Podiatry following for possible surgical management.   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 Diabetic foot ulcer on left foot with osteomyelitis.  Patient is on intravenous antibiotics.  ABIs checked this morning were found to be 0.9 bilaterally.  It is possible that he will likely need surgical intervention/amputation.  Podiatry is following. 2. Diabetes.  Patient is insulin-dependent.  He is continued on 70/30 insulin.  Monitor sliding scale.  Blood sugars currently stable. 3. Chronic pancreatitis related to alcohol abuse.  Continue on Creon capsules.  No complaints of abdominal pain at this time. 4. Alcohol abuse.  Currently on CIWA protocol with Ativan.  No signs of withdrawal at this time. 5. Hyponatremia.  Suspect related to hypovolemia.  Improving with IV fluids.  Continue current treatments. 6. GERD.  Continue on PPI   DVT prophylaxis: Lovenox Code Status: Full code Family Communication: No family present Disposition Plan: Discharge home once improved   Consultants:   Podiatry  Procedures:     Antimicrobials:   Vancomycin 12/3 >  Flagyl 12/3 >  Cefepime 12/3 >   Subjective: Feeling better today.  No pain in left  foot.  Objective: Vitals:   08/20/17 0200 08/20/17 0248 08/20/17 1217 08/20/17 1441  BP:  (!) 138/96 130/81 126/83  Pulse:  (!) 101 (!) 114 (!) 102  Resp:  16 16 14   Temp:  98.7 F (37.1 C) 99.2 F (37.3 C) 99.7 F (37.6 C)  TempSrc:  Oral Oral Oral  SpO2: 100% 100% 100% 100%  Weight:      Height:        Intake/Output Summary (Last 24 hours) at 08/20/2017 1529 Last data filed at 08/20/2017 1204 Gross per 24 hour  Intake 1321.67 ml  Output -  Net 1321.67 ml   Filed Weights   08/19/17 1435 08/19/17 2313  Weight: 70.3 kg (155 lb) 69.3 kg (152 lb 12.5 oz)    Examination:  General exam: Appears calm and comfortable  Respiratory system: Clear to auscultation. Respiratory effort normal. Cardiovascular system: S1 & S2 heard, RRR. No JVD, murmurs, rubs, gallops or clicks. No pedal edema. Gastrointestinal system: Abdomen is nondistended, soft and nontender. No organomegaly or masses felt. Normal bowel sounds heard. Central nervous system: Alert and oriented. No focal neurological deficits. Extremities: Left foot with gauze dressing, not removed. Skin: No rashes, lesions or ulcers Psychiatry: Judgement and insight appear normal. Mood & affect appropriate.     Data Reviewed: I have personally reviewed following labs and imaging studies  CBC: Recent Labs  Lab 08/19/17 1905 08/20/17 0446  WBC 17.3* 13.3*  NEUTROABS 12.2* 8.7*  HGB 13.1 12.0*  HCT 38.6* 35.8*  MCV 99.7 99.4  PLT 339 307   Basic  Metabolic Panel: Recent Labs  Lab 08/19/17 1905 08/20/17 0446  NA 127* 130*  K 3.8 3.8  CL 95* 101  CO2 15* 17*  GLUCOSE 272* 184*  BUN 9 9  CREATININE 1.01 0.78  CALCIUM 9.5 9.2  MG 1.9  --    GFR: Estimated Creatinine Clearance: 111.9 mL/min (by C-G formula based on SCr of 0.78 mg/dL). Liver Function Tests: No results for input(s): AST, ALT, ALKPHOS, BILITOT, PROT, ALBUMIN in the last 168 hours. No results for input(s): LIPASE, AMYLASE in the last 168 hours. No  results for input(s): AMMONIA in the last 168 hours. Coagulation Profile: No results for input(s): INR, PROTIME in the last 168 hours. Cardiac Enzymes: No results for input(s): CKTOTAL, CKMB, CKMBINDEX, TROPONINI in the last 168 hours. BNP (last 3 results) No results for input(s): PROBNP in the last 8760 hours. HbA1C: Recent Labs    08/20/17 0446  HGBA1C 10.0*   CBG: Recent Labs  Lab 08/19/17 2146 08/19/17 2331 08/20/17 0720 08/20/17 1106  GLUCAP 263* 181* 245* 151*   Lipid Profile: No results for input(s): CHOL, HDL, LDLCALC, TRIG, CHOLHDL, LDLDIRECT in the last 72 hours. Thyroid Function Tests: No results for input(s): TSH, T4TOTAL, FREET4, T3FREE, THYROIDAB in the last 72 hours. Anemia Panel: No results for input(s): VITAMINB12, FOLATE, FERRITIN, TIBC, IRON, RETICCTPCT in the last 72 hours. Sepsis Labs: Recent Labs  Lab 08/19/17 2022  LATICACIDVEN 1.2    Recent Results (from the past 240 hour(s))  Blood Cultures x 2 sites     Status: None (Preliminary result)   Collection Time: 08/19/17  8:22 PM  Result Value Ref Range Status   Specimen Description BLOOD LEFT ARM  Final   Special Requests   Final    BOTTLES DRAWN AEROBIC AND ANAEROBIC Blood Culture results may not be optimal due to an inadequate volume of blood received in culture bottles   Culture NO GROWTH < 12 HOURS  Final   Report Status PENDING  Incomplete  Blood Cultures x 2 sites     Status: None (Preliminary result)   Collection Time: 08/19/17  8:26 PM  Result Value Ref Range Status   Specimen Description BLOOD RIGHT HAND  Final   Special Requests   Final    BOTTLES DRAWN AEROBIC AND ANAEROBIC Blood Culture adequate volume   Culture NO GROWTH < 12 HOURS  Final   Report Status PENDING  Incomplete         Radiology Studies: Mr Foot Left Wo Contrast  Result Date: 08/19/2017 CLINICAL DATA:  Chronic open wounds on the left foot. EXAM: MRI OF THE LEFT FOOT WITHOUT CONTRAST TECHNIQUE: Multiplanar,  multisequence MR imaging of the left foot was performed. No intravenous contrast was administered. COMPARISON:  None. FINDINGS: Severe patient motion degrading image quality limiting evaluation. Bones/Joint/Cartilage Transverse metatarsal amputation. Soft tissue ulcer involving the second metatarsal head extending to the cortex with cortical destruction and marrow signal abnormality concerning for osteomyelitis, but it is difficult to be definitive given the degree of patient motion. No fracture or dislocation. Normal alignment. Ligaments Ligaments are suboptimally evaluated by CT. Muscles and Tendons No muscle atrophy. Increased T2 signal within the plantar musculature likely neurogenic. Soft tissue No fluid collection or hematoma.  No soft tissue mass. IMPRESSION: Severe patient motion degrading image quality limiting evaluation. 1. Soft tissue ulcer involving the second metatarsal head extending to the cortex with cortical destruction and marrow signal abnormality concerning for osteomyelitis, but it is difficult to be definitive given the degree  of patient motion. Electronically Signed   By: Elige KoHetal  Patel   On: 08/19/2017 20:33   [place] Arterial Abi (screening Lower Extremity)  Result Date: 08/20/2017 CLINICAL DATA:  Diabetic foot ulceration, osteomyelitis, status post left great toe amputation EXAM: NONINVASIVE PHYSIOLOGIC VASCULAR STUDY OF BILATERAL LOWER EXTREMITIES TECHNIQUE: Evaluation of both lower extremities were performed at rest, including calculation of ankle-brachial indices with single level Doppler, pressure and pulse volume recording. COMPARISON:  None. FINDINGS: Right ABI:  0.96 Left ABI:  0.96 Right Lower Extremity:  Normal arterial waveforms at the ankle. Left Lower Extremity: Biphasic left posterior tibial waveform and flow. Left dorsalis pedis artery is not successful because of the healing wound. 0.9-0.99 Borderline PAD IMPRESSION: Resting ABIs as above, within the range of borderline  peripheral artery disease. Electronically Signed   By: Judie PetitM.  Shick M.D.   On: 08/20/2017 13:33        Scheduled Meds: . enoxaparin (LOVENOX) injection  40 mg Subcutaneous Q24H  . feeding supplement (ENSURE ENLIVE)  237 mL Oral BID BM  . folic acid  1 mg Oral Daily  . insulin aspart  0-15 Units Subcutaneous TID WC  . insulin aspart protamine- aspart  80 Units Subcutaneous Q supper  . insulin aspart protamine- aspart  90 Units Subcutaneous Q breakfast  . lipase/protease/amylase  36,000 Units Oral TID WC & HS  . LORazepam  0-4 mg Intravenous Q6H   Followed by  . [START ON 08/22/2017] LORazepam  0-4 mg Intravenous Q12H  . multivitamin with minerals  1 tablet Oral Daily  . nutrition supplement (JUVEN)  1 packet Oral BID BM  . pantoprazole  40 mg Oral Daily  . thiamine  100 mg Oral Daily   Or  . thiamine  100 mg Intravenous Daily   Continuous Infusions: . sodium chloride 100 mL/hr at 08/20/17 0521  . ceFEPime (MAXIPIME) IV Stopped (08/20/17 1204)  . metronidazole 500 mg (08/20/17 1204)  . vancomycin 1,000 mg (08/20/17 1526)     LOS: 1 day    Time spent: 25mins    Erick BlinksJehanzeb Keene Gilkey, MD [facility] Pager [phone]  If 7PM-7AM, please contact night-coverage [email or web address] Password [email or web address] 08/20/2017, 3:29 PM

## 2017-08-21 ENCOUNTER — Other Ambulatory Visit: Payer: Self-pay | Admitting: Podiatry

## 2017-08-21 DIAGNOSIS — E11621 Type 2 diabetes mellitus with foot ulcer: Secondary | ICD-10-CM

## 2017-08-21 DIAGNOSIS — K860 Alcohol-induced chronic pancreatitis: Secondary | ICD-10-CM

## 2017-08-21 DIAGNOSIS — Z720 Tobacco use: Secondary | ICD-10-CM

## 2017-08-21 DIAGNOSIS — Z794 Long term (current) use of insulin: Secondary | ICD-10-CM

## 2017-08-21 DIAGNOSIS — L97509 Non-pressure chronic ulcer of other part of unspecified foot with unspecified severity: Secondary | ICD-10-CM

## 2017-08-21 DIAGNOSIS — E1169 Type 2 diabetes mellitus with other specified complication: Secondary | ICD-10-CM

## 2017-08-21 DIAGNOSIS — E871 Hypo-osmolality and hyponatremia: Secondary | ICD-10-CM

## 2017-08-21 DIAGNOSIS — F1010 Alcohol abuse, uncomplicated: Secondary | ICD-10-CM

## 2017-08-21 DIAGNOSIS — K219 Gastro-esophageal reflux disease without esophagitis: Secondary | ICD-10-CM

## 2017-08-21 DIAGNOSIS — M869 Osteomyelitis, unspecified: Secondary | ICD-10-CM

## 2017-08-21 DIAGNOSIS — I10 Essential (primary) hypertension: Secondary | ICD-10-CM

## 2017-08-21 LAB — CBC WITH DIFFERENTIAL/PLATELET
BASOS ABS: 0 10*3/uL (ref 0.0–0.1)
Basophils Relative: 0 %
EOS PCT: 0 %
Eosinophils Absolute: 0 10*3/uL (ref 0.0–0.7)
HCT: 37.8 % — ABNORMAL LOW (ref 39.0–52.0)
HEMOGLOBIN: 12.8 g/dL — AB (ref 13.0–17.0)
LYMPHS PCT: 16 %
Lymphs Abs: 2.1 10*3/uL (ref 0.7–4.0)
MCH: 33.2 pg (ref 26.0–34.0)
MCHC: 33.9 g/dL (ref 30.0–36.0)
MCV: 97.9 fL (ref 78.0–100.0)
Monocytes Absolute: 2.6 10*3/uL — ABNORMAL HIGH (ref 0.1–1.0)
Monocytes Relative: 20 %
NEUTROS ABS: 8.3 10*3/uL — AB (ref 1.7–7.7)
NEUTROS PCT: 64 %
PLATELETS: 353 10*3/uL (ref 150–400)
RBC: 3.86 MIL/uL — ABNORMAL LOW (ref 4.22–5.81)
RDW: 11.7 % (ref 11.5–15.5)
WBC: 13 10*3/uL — AB (ref 4.0–10.5)

## 2017-08-21 LAB — BASIC METABOLIC PANEL
Anion gap: 8 (ref 5–15)
Anion gap: 6 (ref 5–15)
BUN: 7 mg/dL (ref 6–20)
BUN: 10 mg/dL (ref 6–20)
CHLORIDE: 104 mmol/L (ref 101–111)
CHLORIDE: 106 mmol/L (ref 101–111)
CO2: 22 mmol/L (ref 22–32)
CO2: 20 mmol/L — ABNORMAL LOW (ref 22–32)
Calcium: 9.2 mg/dL (ref 8.9–10.3)
Calcium: 8.4 mg/dL — ABNORMAL LOW (ref 8.9–10.3)
Creatinine, Ser: 0.64 mg/dL (ref 0.61–1.24)
Creatinine, Ser: 0.7 mg/dL (ref 0.61–1.24)
GFR calc Af Amer: >60 mL/min (ref >60)
Glucose, Bld: 46 mg/dL — ABNORMAL LOW (ref 65–99)
Glucose, Bld: 180 mg/dL — ABNORMAL HIGH (ref 65–99)
POTASSIUM: 2.5 mmol/L — AB (ref 3.5–5.1)
POTASSIUM: 3.8 mmol/L (ref 3.5–5.1)
SODIUM: 134 mmol/L — AB (ref 135–145)
SODIUM: 132 mmol/L — AB (ref 135–145)

## 2017-08-21 LAB — GLUCOSE, CAPILLARY
GLUCOSE-CAPILLARY: 272 mg/dL — AB (ref 65–99)
GLUCOSE-CAPILLARY: 162 mg/dL — AB (ref 65–99)
Glucose-Capillary: 155 mg/dL — ABNORMAL HIGH (ref 65–99)
Glucose-Capillary: 42 mg/dL — CL (ref 65–99)
Glucose-Capillary: 139 mg/dL — ABNORMAL HIGH (ref 65–99)

## 2017-08-21 LAB — MAGNESIUM: MAGNESIUM: 2.1 mg/dL (ref 1.7–2.4)

## 2017-08-21 LAB — HIV ANTIBODY (ROUTINE TESTING W REFLEX): HIV SCREEN 4TH GENERATION: NONREACTIVE

## 2017-08-21 LAB — SURGICAL PCR SCREEN
MRSA, PCR: NEGATIVE
Staphylococcus aureus: NEGATIVE

## 2017-08-21 MED ORDER — INSULIN ASPART 100 UNIT/ML ~~LOC~~ SOLN
5.0000 [IU] | Freq: Three times a day (TID) | SUBCUTANEOUS | Status: DC
  Administered 2017-08-21 – 2017-08-22 (×3): 5 [IU] via SUBCUTANEOUS

## 2017-08-21 MED ORDER — KCL IN DEXTROSE-NACL 20-5-0.9 MEQ/L-%-% IV SOLN
INTRAVENOUS | Status: DC
  Administered 2017-08-21 – 2017-08-23 (×3): via INTRAVENOUS

## 2017-08-21 MED ORDER — INSULIN ASPART 100 UNIT/ML ~~LOC~~ SOLN
0.0000 [IU] | Freq: Three times a day (TID) | SUBCUTANEOUS | Status: DC
  Administered 2017-08-21: 5 [IU] via SUBCUTANEOUS
  Administered 2017-08-21: 7 [IU] via SUBCUTANEOUS
  Administered 2017-08-22 (×2): 3 [IU] via SUBCUTANEOUS
  Administered 2017-08-23: 2 [IU] via SUBCUTANEOUS
  Administered 2017-08-23: 4 [IU] via SUBCUTANEOUS
  Administered 2017-08-23: 2 [IU] via SUBCUTANEOUS
  Administered 2017-08-24: 5 [IU] via SUBCUTANEOUS
  Administered 2017-08-24: 3 [IU] via SUBCUTANEOUS
  Administered 2017-08-24: 5 [IU] via SUBCUTANEOUS

## 2017-08-21 MED ORDER — ENOXAPARIN SODIUM 40 MG/0.4ML ~~LOC~~ SOLN: 40.0000 mg | SUBCUTANEOUS | Status: DC

## 2017-08-21 MED ORDER — POTASSIUM CHLORIDE CRYS ER 10 MEQ PO TBCR
30.0000 meq | EXTENDED_RELEASE_TABLET | Freq: Once | ORAL | Status: AC
  Administered 2017-08-21: 22:00:00 30 meq via ORAL
  Filled 2017-08-21: qty 1

## 2017-08-21 MED ORDER — INSULIN ASPART 100 UNIT/ML ~~LOC~~ SOLN: 4.0000 [IU] | Freq: Three times a day (TID) | SUBCUTANEOUS | Status: DC

## 2017-08-21 MED ORDER — LIDOCAINE HCL (PF) 1 % IJ SOLN
INTRAMUSCULAR | Status: AC
  Filled 2017-08-21: qty 30

## 2017-08-21 MED ORDER — POTASSIUM CHLORIDE IN NACL 20-0.9 MEQ/L-% IV SOLN: INTRAVENOUS | Status: DC

## 2017-08-21 MED ORDER — BUPIVACAINE HCL (PF) 0.5 % IJ SOLN
INTRAMUSCULAR | Status: AC
  Filled 2017-08-21: qty 30

## 2017-08-21 MED ORDER — POTASSIUM CHLORIDE 10 MEQ/100ML IV SOLN
10.0000 meq | INTRAVENOUS | Status: AC
  Administered 2017-08-21 (×4): 10 meq via INTRAVENOUS
  Filled 2017-08-21 (×4): qty 100

## 2017-08-21 MED ORDER — ENOXAPARIN SODIUM 40 MG/0.4ML ~~LOC~~ SOLN
40.0000 mg | SUBCUTANEOUS | Status: DC
  Administered 2017-08-23 – 2017-08-24 (×2): 40 mg via SUBCUTANEOUS
  Filled 2017-08-21 (×2): qty 0.4

## 2017-08-21 MED ORDER — INSULIN GLARGINE 100 UNIT/ML ~~LOC~~ SOLN
15.0000 [IU] | Freq: Every day | SUBCUTANEOUS | Status: DC
  Administered 2017-08-21 – 2017-08-22 (×2): 15 [IU] via SUBCUTANEOUS
  Filled 2017-08-21 (×3): qty 0.15

## 2017-08-21 NOTE — Progress Notes (Signed)
PROGRESS NOTE   Henri MedalJerel L Fei  WUJ:[id number] DOB: [birth year] DOA: 08/19/2017 [facility]   Brief Narrative:  46 year old male with a history of diabetes, presents with left foot wound infection.  Patient reports having chronic blister on the plantar aspect of his left foot for 3-4 months now.  This is been nonhealing.  He has taken antibiotics for this as an outpatient.  MRI imaging indicates possible underlying osteomyelitis.  He is on IV antibiotics.  Podiatry following for possible surgical management.  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 Diabetic foot ulcer on left foot with osteomyelitis.  Continue intravenous antibiotics.  ABIs checked this morning were found to be 0.9 bilaterally.  He is scheduled for definitive surgical intervention/amputation.  Podiatry is following. 2. Diabetes with hypoglycemia - Holding 70/30 insulin due to hypoglycemia.  Will likely start lantus when hypoglycemia corrected.    Add dextrose to IVFs for now to correct hypoglycemia.  Patient is insulin-dependent.    Continue sliding scale.   3. Hypoglycemia - DC 70/30 insulin for now.  Add dextrose to IVFs, monitor closely.   4. Hypokalemia - replacing in IVFs.  Checking magnesium.   5. Chronic pancreatitis related to alcohol abuse.  Continue on Creon capsules.  No complaints of abdominal pain at this time. 6. Alcohol abuse.  Currently on CIWA protocol with Ativan.  No signs of withdrawal at this time. 7. Hyponatremia.  Suspect related to hypovolemia.  Improving with IV fluids.  Continue current treatments. 8. GERD.  Continue on PPI   DVT prophylaxis: Lovenox Code Status: Full code Family Communication: No family  present Disposition Plan: likely will need SNF.   Consultants:   Podiatry  Procedures:     Antimicrobials:   Vancomycin 12/3 >  Flagyl 12/3 >  Cefepime 12/3 >  Subjective: Pt says that the pain persists in foot but overall has been sweating at night.   Objective: Vitals:   08/20/17 1217 08/20/17 1441 08/20/17 2100 08/21/17 0457  BP: 130/81 126/83 139/90 (!) 126/91  Pulse: (!) 114 (!) 102 (!) 118 86  Resp: 16 14 16 16   Temp: 99.2 F (37.3 C) 99.7 F (37.6 C) (!) 96.3 F (35.7 C) (!) 97.3 F (36.3 C)  TempSrc: Oral Oral Oral Oral  SpO2: 100% 100% 100% 100%  Weight:      Height:        Intake/Output Summary (Last 24 hours) at 08/21/2017 0817 Last data filed at 08/21/2017 0500 Gross per 24 hour  Intake 5505 ml  Output 1100 ml  Net 4405 ml   Filed Weights   08/19/17 1435 08/19/17 2313  Weight: 70.3 kg (155 lb) 69.3 kg (152 lb 12.5 oz)    Examination:  General exam: Appears calm and comfortable  Respiratory system: Clear to auscultation. Respiratory effort normal. Cardiovascular system: S1 & S2 heard, RRR. No JVD, murmurs, rubs, gallops or clicks. No pedal edema. Gastrointestinal system: Abdomen is nondistended, soft and nontender. No organomegaly or masses felt. Normal bowel sounds heard. Central nervous system: Alert and oriented. No focal neurological deficits. Extremities: Left foot with gauze dressing, not removed. Skin: No rashes, lesions or ulcers Psychiatry: Judgement and insight appear normal. Mood & affect  appropriate.   Data Reviewed: I have personally reviewed following labs and imaging studies  CBC: Recent Labs  Lab 08/19/17 1905 08/20/17 0446 08/21/17 0508  WBC 17.3* 13.3* 13.0*  NEUTROABS 12.2* 8.7* 8.3*  HGB 13.1 12.0* 12.8*  HCT 38.6* 35.8* 37.8*  MCV 99.7 99.4 97.9  PLT 339 307 353   Basic Metabolic Panel: Recent Labs  Lab 08/19/17 1905 08/20/17 0446 08/21/17 0508  NA 127* 130* 134*  K 3.8 3.8 2.5*  CL 95* 101 104  CO2  15* 17* 22  GLUCOSE 272* 184* 46*  BUN 9 9 7   CREATININE 1.01 0.78 0.64  CALCIUM 9.5 9.2 9.2  MG 1.9  --  2.1   GFR: Estimated Creatinine Clearance: 111.9 mL/min (by C-G formula based on SCr of 0.64 mg/dL). Liver Function Tests: No results for input(s): AST, ALT, ALKPHOS, BILITOT, PROT, ALBUMIN in the last 168 hours. No results for input(s): LIPASE, AMYLASE in the last 168 hours. No results for input(s): AMMONIA in the last 168 hours. Coagulation Profile: No results for input(s): INR, PROTIME in the last 168 hours. Cardiac Enzymes: No results for input(s): CKTOTAL, CKMB, CKMBINDEX, TROPONINI in the last 168 hours. BNP (last 3 results) No results for input(s): PROBNP in the last 8760 hours. HbA1C: Recent Labs    08/20/17 0446  HGBA1C 10.0*   CBG: Recent Labs  Lab 08/20/17 1703 08/20/17 2122 08/20/17 2304 08/21/17 0729 08/21/17 0732  GLUCAP 90 126* 119* 43* 42*   Lipid Profile: No results for input(s): CHOL, HDL, LDLCALC, TRIG, CHOLHDL, LDLDIRECT in the last 72 hours. Thyroid Function Tests: No results for input(s): TSH, T4TOTAL, FREET4, T3FREE, THYROIDAB in the last 72 hours. Anemia Panel: No results for input(s): VITAMINB12, FOLATE, FERRITIN, TIBC, IRON, RETICCTPCT in the last 72 hours. Sepsis Labs: Recent Labs  Lab 08/19/17 2022  LATICACIDVEN 1.2    Recent Results (from the past 240 hour(s))  Blood Cultures x 2 sites     Status: None (Preliminary result)   Collection Time: 08/19/17  8:22 PM  Result Value Ref Range Status   Specimen Description BLOOD LEFT ARM  Final   Special Requests   Final    BOTTLES DRAWN AEROBIC AND ANAEROBIC Blood Culture results may not be optimal due to an inadequate volume of blood received in culture bottles   Culture NO GROWTH 2 DAYS  Final   Report Status PENDING  Incomplete  Blood Cultures x 2 sites     Status: None (Preliminary result)   Collection Time: 08/19/17  8:26 PM  Result Value Ref Range Status   Specimen Description  BLOOD RIGHT HAND  Final   Special Requests   Final    BOTTLES DRAWN AEROBIC AND ANAEROBIC Blood Culture adequate volume   Culture NO GROWTH 2 DAYS  Final   Report Status PENDING  Incomplete    Radiology Studies: Mr Foot Left Wo Contrast  Result Date: 08/19/2017 CLINICAL DATA:  Chronic open wounds on the left foot. EXAM: MRI OF THE LEFT FOOT WITHOUT CONTRAST TECHNIQUE: Multiplanar, multisequence MR imaging of the left foot was performed. No intravenous contrast was administered. COMPARISON:  None. FINDINGS: Severe patient motion degrading image quality limiting evaluation. Bones/Joint/Cartilage Transverse metatarsal amputation. Soft tissue ulcer involving the second metatarsal head extending to the cortex with cortical destruction and marrow signal abnormality concerning for osteomyelitis, but it is difficult to be definitive given the degree of patient motion. No fracture or dislocation. Normal alignment. Ligaments Ligaments are suboptimally evaluated by CT. Muscles and Tendons  No muscle atrophy. Increased T2 signal within the plantar musculature likely neurogenic. Soft tissue No fluid collection or hematoma.  No soft tissue mass. IMPRESSION: Severe patient motion degrading image quality limiting evaluation. 1. Soft tissue ulcer involving the second metatarsal head extending to the cortex with cortical destruction and marrow signal abnormality concerning for osteomyelitis, but it is difficult to be definitive given the degree of patient motion. Electronically Signed   By: Elige KoHetal  Patel   On: 08/19/2017 20:33   [place] Arterial Abi (screening Lower Extremity)  Result Date: 08/20/2017 CLINICAL DATA:  Diabetic foot ulceration, osteomyelitis, status post left great toe amputation EXAM: NONINVASIVE PHYSIOLOGIC VASCULAR STUDY OF BILATERAL LOWER EXTREMITIES TECHNIQUE: Evaluation of both lower extremities were performed at rest, including calculation of ankle-brachial indices with single level Doppler, pressure and  pulse volume recording. COMPARISON:  None. FINDINGS: Right ABI:  0.96 Left ABI:  0.96 Right Lower Extremity:  Normal arterial waveforms at the ankle. Left Lower Extremity: Biphasic left posterior tibial waveform and flow. Left dorsalis pedis artery is not successful because of the healing wound. 0.9-0.99 Borderline PAD IMPRESSION: Resting ABIs as above, within the range of borderline peripheral artery disease. Electronically Signed   By: Judie PetitM.  Shick M.D.   On: 08/20/2017 13:33    Scheduled Meds: . enoxaparin (LOVENOX) injection  40 mg Subcutaneous Q24H  . feeding supplement (ENSURE ENLIVE)  237 mL Oral BID BM  . folic acid  1 mg Oral Daily  . insulin aspart  0-15 Units Subcutaneous TID WC  . lipase/protease/amylase  36,000 Units Oral TID WC & HS  . LORazepam  0-4 mg Intravenous Q6H   Followed by  . [START ON 08/22/2017] LORazepam  0-4 mg Intravenous Q12H  . multivitamin with minerals  1 tablet Oral Daily  . nutrition supplement (JUVEN)  1 packet Oral BID BM  . pantoprazole  40 mg Oral Daily  . thiamine  100 mg Oral Daily   Or  . thiamine  100 mg Intravenous Daily   Continuous Infusions: . ceFEPime (MAXIPIME) IV Stopped (08/21/17 0248)  . dextrose 5 % and 0.9 % NaCl with KCl 20 mEq/L    . metronidazole 500 mg (08/21/17 0301)  . potassium chloride    . vancomycin Stopped (08/21/17 0249)     LOS: 2 days   Time spent: 25mins  Standley Dakinslanford Johnson, MD [facility] Pager [phone]  If 7PM-7AM, please contact night-coverage [email or web address] Password [email or web address] 08/21/2017, 8:17 AM

## 2017-08-21 NOTE — Clinical Social Work Placement (Signed)
   CLINICAL SOCIAL WORK PLACEMENT  NOTE  Date:  08/21/2017  Patient Details  Name: [name] MRN: [id number] Date of Birth: 1971/05/15  Clinical Social Work is seeking post-discharge placement for this patient at the Skilled  Nursing Facility level of care (*CSW will initial, date and re-position this form in  chart as items are completed):  Yes   Patient/family provided with [facility] Clinical Social Work Department's list of facilities offering this level of care within the geographic area requested by the patient (or if unable, by the patient's family).  Yes   Patient/family informed of their freedom to choose among providers that offer the needed level of care, that participate in Medicare, Medicaid or managed care program needed by the patient, have an available bed and are willing to accept the patient.  Yes   Patient/family informed of [facility]'s ownership interest in [facility] and [facility], as well as of the fact that they are under no obligation to receive care at these facilities.  PASRR submitted to EDS on 08/21/17     PASRR number received on 08/21/17     Existing PASRR number confirmed on       FL2 transmitted to all facilities in geographic area requested by pt/family on 08/21/17     FL2 transmitted to all facilities within larger geographic area on       Patient informed that his/her managed care company has contracts with or will negotiate with certain facilities, including the following:            Patient/family informed of bed offers received.  Patient chooses bed at       Physician recommends and patient chooses bed at      Patient to be transferred to   on  .  Patient to be transferred to facility by       Patient family notified on   of transfer.  Name of family member notified:        PHYSICIAN       Additional Comment:    _______________________________________________ Annice NeedySettle, Ramonita Koenig D, LCSW 08/21/2017, 4:15 PM

## 2017-08-21 NOTE — Progress Notes (Signed)
Hypoglycemic Event  CBG: 42  Treatment: 15 GM carbohydrate snack  Symptoms: None  Follow-up CBG: Time:0800  CBG Result:155  Possible Reasons for Event: Medication regimen: high dose of 70/30 insulin, also NPO since MN  Comments/MD notified:Johnson notified - orders placed    LanareHurd, Jeanella AntonMegan Nicole

## 2017-08-21 NOTE — [place] FL2 (Signed)
[place] MEDICAID FL2 LEVEL OF CARE SCREENING TOOL     IDENTIFICATION  Patient Name: [name] Birthdate: Feb 10, 1971 Sex: male Admission Date (Current Location): 08/19/2017  [facility] [place]   Facility and Address:  [facility],  [address], [place] [id number]      Provider Number: [id number]  Attending Physician Name and Address:  Cleora FleetJohnson, Clanford L, MD  Relative Name and Phone Number:       Current Level of Care: Hospital Recommended Level of Care: Skilled Nursing Facility Prior Approval Number:    Date Approved/Denied:   PASRR Number: [id number]  Discharge Plan: SNF    Current Diagnoses: Patient Active Problem List   Diagnosis Date Noted  . Diabetic foot ulcer with osteomyelitis (HCC) 08/19/2017  . Hyponatremia 08/19/2017  . 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 Acute osteomyelitis of left foot (HCC) 02/16/2017  . Protein-calorie malnutrition, severe 02/16/2017  . Subacute osteomyelitis of left foot (HCC)   . Diabetic foot (HCC) 11/28/2016  . Gangrene (HCC) 11/28/2016  . Uncontrolled type 1 diabetes mellitus with complication (HCC) 07/14/2015  . Essential hypertension, benign 07/14/2015  . Exocrine pancreatic insufficiency (HCC) 03/31/2013  . Alcohol-induced chronic pancreatitis (HCC) 02/16/2013  . Elevated LFTs 02/16/2013  . Pancreatic mass 02/16/2013  . Hypokalemia 02/16/2013  . GERD 10/31/2009  . IDDM (insulin dependent diabetes mellitus) (HCC) 08/30/2009  . Alcohol abuse 08/30/2009  . Tobacco use 08/30/2009  . Cannabis abuse 08/30/2009  . PANCREATITIS 08/30/2009    Orientation RESPIRATION BLADDER Height & Weight     Self, Time, Situation, Place  Normal Continent Weight: 152 lb 12.5 oz (69.3 kg) Height:  6\' 1"  (185.4 cm)  BEHAVIORAL SYMPTOMS/MOOD NEUROLOGICAL BOWEL NUTRITION STATUS      Continent Diet(Carb  modified)  AMBULATORY STATUS COMMUNICATION OF NEEDS Skin   Extensive Assist Verbally Other (Comment)(Patient is scheduled to have transmetatarsal amputation of left foot wiht TAL)                       Personal Care Assistance Level of Assistance  Bathing, Feeding, Dressing Bathing Assistance: Limited assistance Feeding assistance: Independent Dressing Assistance: Limited assistance     Functional Limitations Info  Sight, Hearing, Speech Sight Info: Adequate Hearing Info: Adequate Speech Info: Adequate    SPECIAL CARE FACTORS FREQUENCY  PT (By licensed PT)     PT Frequency: 5x/week              Contractures Contractures Info: Not present    Additional Factors Info  Code Status Code Status Info: Full code             Current Medications (08/21/2017):  This is the current hospital active medication list Current Facility-Administered Medications  Medication Dose Route Frequency Provider Last Rate Last Dose  . ceFEPIme (MAXIPIME) 2 g in dextrose 5 % 50 mL IVPB  2 g Intravenous Q12H Samuel JesterMcManus, Kathleen, DO 100 mL/hr at 08/21/17 1057 2 g at 08/21/17 1057  . dextrose 5 % and 0.9 % NaCl with KCl 20 mEq/L infusion   Intravenous Continuous Johnson, Clanford L, MD 60 mL/hr at 08/21/17 0830    . enoxaparin (LOVENOX) injection 40 mg  40 mg Subcutaneous Q24H Bobette Mortiz, David Manuel, MD   40 mg at 08/21/17 1015  . feeding supplement (ENSURE ENLIVE) (ENSURE ENLIVE) liquid 237 mL  237 mL Oral BID BM  Erick BlinksMemon, Jehanzeb, MD   237 mL at 08/21/17 0949  . folic acid (FOLVITE) tablet 1 mg  1 mg Oral Daily Bobette Mortiz, David Manuel, MD   1 mg at 08/21/17 1014  . HYDROcodone-acetaminophen (NORCO) 7.5-325 MG per tablet 1 tablet  1 tablet Oral Q6H PRN Bobette Mortiz, David Manuel, MD   1 tablet at 08/21/17 1112  . insulin aspart (novoLOG) injection 0-9 Units  0-9 Units Subcutaneous TID WC Johnson, Clanford L, MD      . insulin aspart (novoLOG) injection 5 Units  5 Units Subcutaneous TID WC Johnson, Clanford L, MD       . insulin glargine (LANTUS) injection 15 Units  15 Units Subcutaneous Daily Johnson, Clanford L, MD   15 Units at 08/21/17 1014  . lipase/protease/amylase (CREON) capsule 36,000 Units  36,000 Units Oral TID WC & HS Bobette Mortiz, David Manuel, MD   36,000 Units at 08/21/17 [phone]  . LORazepam (ATIVAN) injection 0-4 mg  0-4 mg Intravenous Q6H Bobette Mortiz, David Manuel, MD   2 mg at 08/20/17 0310   Followed by  . [START ON 08/22/2017] LORazepam (ATIVAN) injection 0-4 mg  0-4 mg Intravenous Q12H Bobette Mortiz, David Manuel, MD      . LORazepam (ATIVAN) tablet 1 mg  1 mg Oral Q6H PRN Bobette Mortiz, David Manuel, MD       Or  . LORazepam (ATIVAN) injection 1 mg  1 mg Intravenous Q6H PRN Bobette Mortiz, David Manuel, MD      . metroNIDAZOLE (FLAGYL) IVPB 500 mg  500 mg Intravenous Q8H Samuel JesterMcManus, Kathleen, DO 100 mL/hr at 08/21/17 0301 500 mg at 08/21/17 0301  . multivitamin with minerals tablet 1 tablet  1 tablet Oral Daily Bobette Mortiz, David Manuel, MD   1 tablet at 08/21/17 1014  . nutrition supplement (JUVEN) (JUVEN) powder packet 1 packet  1 packet Oral BID BM Erick BlinksMemon, Jehanzeb, MD   1 packet at 08/21/17 1005  . pantoprazole (PROTONIX) EC tablet 40 mg  40 mg Oral Daily Bobette Mortiz, David Manuel, MD   40 mg at 08/21/17 1014  . potassium chloride 10 mEq in 100 mL IVPB  10 mEq Intravenous Q1 Hr x 4 Johnson, Clanford L, MD 100 mL/hr at 08/21/17 1049 10 mEq at 08/21/17 1049  . thiamine (VITAMIN B-1) tablet 100 mg  100 mg Oral Daily Bobette Mortiz, David Manuel, MD   100 mg at 08/20/17 1042   Or  . thiamine (B-1) injection 100 mg  100 mg Intravenous Daily Bobette Mortiz, David Manuel, MD      . vancomycin [facility](VANCOCIN) IVPB 1000 mg/200 mL premix  1,000 mg Intravenous Q8H Samuel JesterMcManus, Kathleen, DO   Stopped at 08/21/17 [phone]     Discharge Medications: Please see discharge summary for a list of discharge medications.  Relevant Imaging Results:  Relevant Lab Results:   Additional Information SSN [id number]   Remmy Riffe, Juleen ChinaHeather D, LCSW

## 2017-08-21 NOTE — Clinical Social Work Note (Signed)
Clinical Social Work Assessment  Patient Details  Name: [name] MRN: [id number] Date of Birth: [birth year]  Date of referral:  08/21/17               Reason for consult:  Facility Placement                Permission sought to share information with:    Permission granted to share information::     Name::        Agency::     Relationship::     Contact Information:     Housing/Transportation Living arrangements for the past 2 months:  Single Family Home Source of Information:  Patient Patient Interpreter Needed:  None Criminal Activity/Legal Involvement Pertinent to Current Situation/Hospitalization:  No - Comment as needed Significant Relationships:  Siblings Lives with:  Siblings Do you feel safe going back to the place where you live?  Yes Need for family participation in patient care:  Yes (Comment)  Care giving concerns:  None identified.   Social Worker assessment / plan:  Patient states that he lives with his older brother. He stated that at baseline he is independent in ambulation and ADLs. Patient is agreeable to SNF placement.   Employment status:  Disabled (Comment on whether or not currently receiving Disability) Insurance information:  Medicare PT Recommendations:  Not assessed at this time Information / Referral to community resources:  Skilled Nursing Facility  Patient/Family's Response to care:  Patient is agreeable to short term SNF.   Patient/Family's Understanding of and Emotional Response to Diagnosis, Current Treatment, and Prognosis:  Patient understands his diagnosis, treatment and prognosis.   Emotional Assessment Appearance:  Appears stated age Attitude/Demeanor/Rapport:    Affect (typically observed):  Accepting, Calm Orientation:  Oriented to Situation, Oriented to  Time, Oriented to Place, Oriented to Self Alcohol / Substance use:  Alcohol Use Psych involvement (Current and /or in the community):  No (Comment)  Discharge Needs  Concerns  to be addressed:  Discharge Planning Concerns Readmission within the last 30 days:  No Current discharge risk:  None Barriers to Discharge:  No Barriers Identified   [name] NeedySettle, [name], [name] 08/21/2017, 4:17 PM

## 2017-08-21 NOTE — Progress Notes (Signed)
Podiatry Progress Note  Subjective [name] was scheduled for transmetatarsal amputation of the left foot with TAL.  Surgery cancelled due to hypokalemia.  Patient feeling okay.    Objective Vital signs in last 24 hours:   Temp:  [96.3 F (35.7 C)-99.7 F (37.6 C)] 97.3 F (36.3 C) (12/05 0457) Pulse Rate:  [86-118] 86 (12/05 0457) Resp:  [14-16] 16 (12/05 0457) BP: (126-139)/(81-91) 126/91 (12/05 0457) SpO2:  [100 %] 100 % (12/05 0457)  DRESSING:  Clean, dry and intact without strikethrough.  Dressing was not removed.  Lab/Test Results  Recent Labs    08/20/17 0446 08/21/17 0508  WBC 13.3* 13.0*  HGB 12.0* 12.8*  HCT 35.8* 37.8*  PLT 307 353  NA 130* 134*  K 3.8 2.5*  CL 101 104  CO2 17* 22  BUN 9 7  CREATININE 0.78 0.64  GLUCOSE 184* 46*  CALCIUM 9.2 9.2    Recent Results (from the past 240 hour(s))  Blood Cultures x 2 sites     Status: None (Preliminary result)   Collection Time: 08/19/17  8:22 PM  Result Value Ref Range Status   Specimen Description BLOOD LEFT ARM  Final   Special Requests   Final    BOTTLES DRAWN AEROBIC AND ANAEROBIC Blood Culture results may not be optimal due to an inadequate volume of blood received in culture bottles   Culture NO GROWTH 2 DAYS  Final   Report Status PENDING  Incomplete  Blood Cultures x 2 sites     Status: None (Preliminary result)   Collection Time: 08/19/17  8:26 PM  Result Value Ref Range Status   Specimen Description BLOOD RIGHT HAND  Final   Special Requests   Final    BOTTLES DRAWN AEROBIC AND ANAEROBIC Blood Culture adequate volume   Culture NO GROWTH 2 DAYS  Final   Report Status PENDING  Incomplete     Mr Foot Left Wo Contrast  Result Date: 08/19/2017 CLINICAL DATA:  Chronic open wounds on the left foot. EXAM: MRI OF THE LEFT FOOT WITHOUT CONTRAST TECHNIQUE: Multiplanar, multisequence MR imaging of the left foot was performed. No intravenous contrast was administered. COMPARISON:  None. FINDINGS:  Severe patient motion degrading image quality limiting evaluation. Bones/Joint/Cartilage Transverse metatarsal amputation. Soft tissue ulcer involving the second metatarsal head extending to the cortex with cortical destruction and marrow signal abnormality concerning for osteomyelitis, but it is difficult to be definitive given the degree of patient motion. No fracture or dislocation. Normal alignment. Ligaments Ligaments are suboptimally evaluated by CT. Muscles and Tendons No muscle atrophy. Increased T2 signal within the plantar musculature likely neurogenic. Soft tissue No fluid collection or hematoma.  No soft tissue mass. IMPRESSION: Severe patient motion degrading image quality limiting evaluation. 1. Soft tissue ulcer involving the second metatarsal head extending to the cortex with cortical destruction and marrow signal abnormality concerning for osteomyelitis, but it is difficult to be definitive given the degree of patient motion. Electronically Signed   By: [name] KoHetal  [name]   On: 08/19/2017 20:33   [place] Arterial Abi (screening Lower Extremity)  Result Date: 08/20/2017 CLINICAL DATA:  Diabetic foot ulceration, osteomyelitis, status post left great toe amputation EXAM: NONINVASIVE PHYSIOLOGIC VASCULAR STUDY OF BILATERAL LOWER EXTREMITIES TECHNIQUE: Evaluation of both lower extremities were performed at rest, including calculation of ankle-brachial indices with single level Doppler, pressure and pulse volume recording. COMPARISON:  None. FINDINGS: Right ABI:  0.96 Left ABI:  0.96 Right Lower Extremity:  Normal arterial waveforms at  the ankle. Left Lower Extremity: Biphasic left posterior tibial waveform and flow. Left dorsalis pedis artery is not successful because of the healing wound. 0.9-0.99 Borderline PAD IMPRESSION: Resting ABIs as above, within the range of borderline peripheral artery disease. Electronically Signed   By: Judie PetitM.  Shick ErnestD.   On: 08/20/2017 13:33    Medications Scheduled Meds: .  enoxaparin (LOVENOX) injection  40 mg Subcutaneous Q24H  . feeding supplement (ENSURE ENLIVE)  237 mL Oral BID BM  . folic acid  1 mg Oral Daily  . insulin aspart  0-9 Units Subcutaneous TID WC  . insulin aspart  5 Units Subcutaneous TID WC  . insulin glargine  15 Units Subcutaneous Daily  . lipase/protease/amylase  36,000 Units Oral TID WC & HS  . LORazepam  0-4 mg Intravenous Q6H   Followed by  . [START ON 08/22/2017] LORazepam  0-4 mg Intravenous Q12H  . multivitamin with minerals  1 tablet Oral Daily  . nutrition supplement (JUVEN)  1 packet Oral BID BM  . pantoprazole  40 mg Oral Daily  . thiamine  100 mg Oral Daily   Or  . thiamine  100 mg Intravenous Daily   Continuous Infusions: . ceFEPime (MAXIPIME) IV Stopped (08/21/17 0248)  . dextrose 5 % and 0.9 % NaCl with KCl 20 mEq/L 60 mL/hr at 08/21/17 0830  . metronidazole 500 mg (08/21/17 0301)  . potassium chloride 10 mEq (08/21/17 0833)  . vancomycin Stopped (08/21/17 0249)   PRN Meds:.HYDROcodone-acetaminophen, LORazepam **OR** LORazepam  Assessment 1.  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 2.  Cellulitis, left foot. 3.  Non-healing ulceration, left foot. 4.  Diabetes mellitus with peripheral neuropathy.  Plan Surgery cancelled for today.  Will proceed with surgery tomorrow AM if hypokalemia improved.  NPO after midnight.  Hold anticoagulation.  Hold long acting insulin.  Antibiotics per hospitalist service.    [name] [name] 08/21/2017, 9:10 AM

## 2017-08-22 ENCOUNTER — Inpatient Hospital Stay (HOSPITAL_COMMUNITY): Payer: Medicare Other

## 2017-08-22 ENCOUNTER — Encounter (HOSPITAL_COMMUNITY): Payer: Self-pay | Admitting: Anesthesiology

## 2017-08-22 ENCOUNTER — Inpatient Hospital Stay (HOSPITAL_COMMUNITY): Payer: Medicare Other | Admitting: Anesthesiology

## 2017-08-22 ENCOUNTER — Encounter (HOSPITAL_COMMUNITY)
Admission: EM | Disposition: A | Discharge status: Skilled Nursing Facility | Payer: Self-pay | Source: Home / Self Care | Attending: Family Medicine

## 2017-08-22 HISTORY — PX: TRANSMETATARSAL AMPUTATION: SHX6197

## 2017-08-22 HISTORY — PX: ACHILLES TENDON SURGERY: SHX542

## 2017-08-22 LAB — CBC WITH DIFFERENTIAL/PLATELET
Basophils Absolute: 0 10*3/uL (ref 0.0–0.1)
Basophils Relative: 1 %
EOS ABS: 0.1 10*3/uL (ref 0.0–0.7)
Eosinophils Relative: 1 %
HEMATOCRIT: 38.4 % — AB (ref 39.0–52.0)
HEMOGLOBIN: 12.8 g/dL — AB (ref 13.0–17.0)
LYMPHS ABS: 2.3 10*3/uL (ref 0.7–4.0)
Lymphocytes Relative: 27 %
MCH: 32.8 pg (ref 26.0–34.0)
MCHC: 33.3 g/dL (ref 30.0–36.0)
MCV: 98.5 fL (ref 78.0–100.0)
MONO ABS: 1.8 10*3/uL — AB (ref 0.1–1.0)
MONOS PCT: 22 %
NEUTROS PCT: 49 %
Neutro Abs: 4.2 10*3/uL (ref 1.7–7.7)
Platelets: 337 10*3/uL (ref 150–400)
RBC: 3.9 MIL/uL — ABNORMAL LOW (ref 4.22–5.81)
RDW: 11.9 % (ref 11.5–15.5)
WBC: 8.4 10*3/uL (ref 4.0–10.5)

## 2017-08-22 LAB — GLUCOSE, CAPILLARY
GLUCOSE-CAPILLARY: 43 mg/dL — AB (ref 65–99)
GLUCOSE-CAPILLARY: 227 mg/dL — AB (ref 65–99)
GLUCOSE-CAPILLARY: 233 mg/dL — AB (ref 65–99)
Glucose-Capillary: 227 mg/dL — ABNORMAL HIGH (ref 65–99)
Glucose-Capillary: 255 mg/dL — ABNORMAL HIGH (ref 65–99)
Glucose-Capillary: 238 mg/dL — ABNORMAL HIGH (ref 65–99)

## 2017-08-22 LAB — BASIC METABOLIC PANEL
Anion gap: 7 (ref 5–15)
BUN: 6 mg/dL (ref 6–20)
CALCIUM: 9.1 mg/dL (ref 8.9–10.3)
CHLORIDE: 104 mmol/L (ref 101–111)
CO2: 23 mmol/L (ref 22–32)
CREATININE: 0.65 mg/dL (ref 0.61–1.24)
GFR calc Af Amer: >60 mL/min (ref >60)
Glucose, Bld: 242 mg/dL — ABNORMAL HIGH (ref 65–99)
Potassium: 3.7 mmol/L (ref 3.5–5.1)
Sodium: 134 mmol/L — ABNORMAL LOW (ref 135–145)

## 2017-08-22 LAB — VANCOMYCIN, TROUGH: Vancomycin Tr: 17 ug/mL (ref 15–20)

## 2017-08-22 SURGERY — AMPUTATION, FOOT, TRANSMETATARSAL
Anesthesia: Monitor Anesthesia Care | Laterality: Left

## 2017-08-22 MED ORDER — PROPOFOL 10 MG/ML IV BOLUS
INTRAVENOUS | Status: AC
  Filled 2017-08-22: qty 40

## 2017-08-22 MED ORDER — ROCURONIUM BROMIDE 100 MG/10ML IV SOLN
INTRAVENOUS | Status: DC | PRN
  Administered 2017-08-22: 5 mg via INTRAVENOUS
  Administered 2017-08-22: 25 mg via INTRAVENOUS

## 2017-08-22 MED ORDER — ONDANSETRON 4 MG PO TBDP
ORAL_TABLET | ORAL | Status: AC
  Filled 2017-08-22: qty 1

## 2017-08-22 MED ORDER — SUGAMMADEX SODIUM 200 MG/2ML IV SOLN
INTRAVENOUS | Status: DC | PRN
  Administered 2017-08-22: 150 mg via INTRAVENOUS

## 2017-08-22 MED ORDER — MORPHINE SULFATE (PF) 2 MG/ML IV SOLN
1.0000 mg | INTRAVENOUS | Status: DC | PRN
  Administered 2017-08-22 – 2017-08-24 (×6): 1 mg via INTRAVENOUS
  Filled 2017-08-22 (×6): qty 1

## 2017-08-22 MED ORDER — INSULIN GLARGINE 100 UNIT/ML ~~LOC~~ SOLN
18.0000 [IU] | Freq: Every day | SUBCUTANEOUS | Status: DC
  Filled 2017-08-22: qty 0.18

## 2017-08-22 MED ORDER — DEXTROSE 5 % IV SOLN
2.0000 g | Freq: Three times a day (TID) | INTRAVENOUS | Status: DC
  Administered 2017-08-22 – 2017-08-24 (×6): 2 g via INTRAVENOUS
  Filled 2017-08-22 (×13): qty 2

## 2017-08-22 MED ORDER — SUGAMMADEX SODIUM 200 MG/2ML IV SOLN
INTRAVENOUS | Status: AC
  Filled 2017-08-22: qty 2

## 2017-08-22 MED ORDER — FENTANYL CITRATE (PF) 100 MCG/2ML IJ SOLN
INTRAMUSCULAR | Status: AC
  Filled 2017-08-22: qty 2

## 2017-08-22 MED ORDER — OXYCODONE HCL 5 MG PO TABS: 5.0000 mg | ORAL_TABLET | Freq: Once | ORAL | Status: DC | PRN

## 2017-08-22 MED ORDER — ONDANSETRON 4 MG PO TBDP
4.0000 mg | ORAL_TABLET | Freq: Once | ORAL | Status: AC
  Administered 2017-08-22: 4 mg via ORAL

## 2017-08-22 MED ORDER — ROCURONIUM BROMIDE 50 MG/5ML IV SOLN
INTRAVENOUS | Status: AC
  Filled 2017-08-22: qty 1

## 2017-08-22 MED ORDER — MIDAZOLAM HCL 2 MG/2ML IJ SOLN
1.0000 mg | Freq: Once | INTRAMUSCULAR | Status: AC | PRN
  Administered 2017-08-22: 2 mg via INTRAVENOUS

## 2017-08-22 MED ORDER — LACTATED RINGERS IV SOLN
INTRAVENOUS | Status: DC
  Administered 2017-08-22: 1000 mL via INTRAVENOUS
  Administered 2017-08-22: 09:00:00 via INTRAVENOUS

## 2017-08-22 MED ORDER — BUPIVACAINE HCL (PF) 0.5 % IJ SOLN
INTRAMUSCULAR | Status: DC | PRN
  Administered 2017-08-22: 20 mL

## 2017-08-22 MED ORDER — INSULIN ASPART 100 UNIT/ML ~~LOC~~ SOLN
6.0000 [IU] | Freq: Three times a day (TID) | SUBCUTANEOUS | Status: DC
  Administered 2017-08-22 – 2017-08-24 (×7): 6 [IU] via SUBCUTANEOUS

## 2017-08-22 MED ORDER — OXYCODONE HCL 5 MG/5ML PO SOLN: 5.0000 mg | Freq: Once | ORAL | Status: DC | PRN

## 2017-08-22 MED ORDER — 0.9 % SODIUM CHLORIDE (POUR BTL) OPTIME
TOPICAL | Status: DC | PRN
  Administered 2017-08-22: 1000 mL

## 2017-08-22 MED ORDER — MIDAZOLAM HCL 2 MG/2ML IJ SOLN
INTRAMUSCULAR | Status: AC
  Filled 2017-08-22: qty 2

## 2017-08-22 MED ORDER — MIDAZOLAM HCL 5 MG/5ML IJ SOLN
INTRAMUSCULAR | Status: DC | PRN
  Administered 2017-08-22: 2 mg via INTRAVENOUS

## 2017-08-22 MED ORDER — SUCCINYLCHOLINE CHLORIDE 20 MG/ML IJ SOLN
INTRAMUSCULAR | Status: DC | PRN
  Administered 2017-08-22: 160 mg via INTRAVENOUS

## 2017-08-22 MED ORDER — FENTANYL CITRATE (PF) 100 MCG/2ML IJ SOLN
INTRAMUSCULAR | Status: DC | PRN
  Administered 2017-08-22 (×2): 50 ug via INTRAVENOUS

## 2017-08-22 MED ORDER — HYDROMORPHONE HCL 1 MG/ML IJ SOLN
0.2500 mg | INTRAMUSCULAR | Status: DC | PRN
  Administered 2017-08-22 (×2): 0.5 mg via INTRAVENOUS
  Filled 2017-08-22: qty 1

## 2017-08-22 SURGICAL SUPPLY — 51 items
APL SKNCLS STERI-STRIP NONHPOA (GAUZE/BANDAGES/DRESSINGS) ×1
BAG HAMPER (MISCELLANEOUS) ×3 IMPLANT
BANDAGE ELASTIC 4 LF NS (GAUZE/BANDAGES/DRESSINGS) ×6 IMPLANT
BANDAGE ELASTIC 4 VELCRO NS (GAUZE/BANDAGES/DRESSINGS) ×3 IMPLANT
BANDAGE ESMARK 4X12 BL STRL LF (DISPOSABLE) ×1 IMPLANT
BENZOIN TINCTURE PRP APPL 2/3 (GAUZE/BANDAGES/DRESSINGS) ×3 IMPLANT
BLADE 11 SAFETY STRL DISP (BLADE) ×3 IMPLANT
BLADE 15 SAFETY STRL DISP (BLADE) ×6 IMPLANT
BLADE AVERAGE 25MMX9MM (BLADE) ×1
BLADE AVERAGE 25X9 (BLADE) ×2 IMPLANT
BNDG CMPR 12X4 ELC STRL LF (DISPOSABLE) ×1
BNDG CMPR MED 5X4 ELC HKLP NS (GAUZE/BANDAGES/DRESSINGS) ×2
BNDG CONFORM 2 STRL LF (GAUZE/BANDAGES/DRESSINGS) ×3 IMPLANT
BNDG ESMARK 4X12 BLUE STRL LF (DISPOSABLE) ×3
BNDG GAUZE ELAST 4 BULKY (GAUZE/BANDAGES/DRESSINGS) ×3 IMPLANT
CLOSURE STERI STRIP 1/2 X4 (GAUZE/BANDAGES/DRESSINGS) ×6 IMPLANT
CLOTH BEACON ORANGE TIMEOUT ST (SAFETY) ×3 IMPLANT
COVER LIGHT HANDLE STERIS (MISCELLANEOUS) ×6 IMPLANT
CUFF TOURNIQUET SINGLE 24IN (TOURNIQUET CUFF) ×3 IMPLANT
DECANTER SPIKE VIAL GLASS SM (MISCELLANEOUS) ×3 IMPLANT
DRAPE OEC MINIVIEW 54X84 (DRAPES) ×3 IMPLANT
DRSG ADAPTIC 3X8 NADH LF (GAUZE/BANDAGES/DRESSINGS) ×3 IMPLANT
ELECT REM PT RETURN 9FT ADLT (ELECTROSURGICAL) ×3
ELECTRODE REM PT RTRN 9FT ADLT (ELECTROSURGICAL) ×1 IMPLANT
GAUZE KERLIX 2X3 DERM STRL LF (GAUZE/BANDAGES/DRESSINGS) ×3 IMPLANT
GAUZE SPONGE 4X4 12PLY STRL (GAUZE/BANDAGES/DRESSINGS) ×3 IMPLANT
GLOVE BIO SURGEON STRL SZ7 (GLOVE) ×3 IMPLANT
GLOVE BIO SURGEON STRL SZ7.5 (GLOVE) ×3 IMPLANT
GLOVE BIOGEL M STRL SZ7.5 (GLOVE) ×6 IMPLANT
GLOVE BIOGEL PI IND STRL 7.0 (GLOVE) ×3 IMPLANT
GLOVE BIOGEL PI INDICATOR 7.0 (GLOVE) ×6
GOWN STRL REUS W/TWL LRG LVL3 (GOWN DISPOSABLE) ×9 IMPLANT
KIT ROOM TURNOVER APOR (KITS) ×3 IMPLANT
MANIFOLD NEPTUNE II (INSTRUMENTS) ×3 IMPLANT
NEEDLE HYPO 27GX1-1/4 (NEEDLE) ×6 IMPLANT
NS IRRIG 1000ML POUR BTL (IV SOLUTION) ×3 IMPLANT
PACK BASIC LIMB (CUSTOM PROCEDURE TRAY) ×3 IMPLANT
PAD ABD 5X9 TENDERSORB (GAUZE/BANDAGES/DRESSINGS) ×3 IMPLANT
PAD ARMBOARD 7.5X6 YLW CONV (MISCELLANEOUS) ×3 IMPLANT
PAD CAST 4YDX4 CTTN HI CHSV (CAST SUPPLIES) ×2 IMPLANT
PADDING CAST COTTON 4X4 STRL (CAST SUPPLIES) ×6
SET BASIN LINEN APH (SET/KITS/TRAYS/PACK) ×3 IMPLANT
SOL PREP PROV IODINE SCRUB 4OZ (MISCELLANEOUS) ×3 IMPLANT
SPLINT J IMMOBILIZER 4X20FT (CAST SUPPLIES) ×1 IMPLANT
SPLINT J PLASTER J 4INX20Y (CAST SUPPLIES) ×2
SPONGE LAP 18X18 X RAY DECT (DISPOSABLE) ×3 IMPLANT
SUT ETHILON 3 0 FSL (SUTURE) ×12 IMPLANT
SUT ETHILON 4 0 PS 2 18 (SUTURE) ×6 IMPLANT
SUT VIC AB 4-0 PS2 27 (SUTURE) ×6 IMPLANT
SYR CONTROL 10ML LL (SYRINGE) ×6 IMPLANT
SYSTEM CHEST DRAIN TLS 7FR (DRAIN) ×3 IMPLANT

## 2017-08-22 NOTE — Anesthesia Procedure Notes (Signed)
Procedure Name: Intubation Date/Time: 08/22/2017 7:48 AM Performed by: Charmaine Downs, CRNA Pre-anesthesia Checklist: Patient identified, Emergency Drugs available, Suction available and Patient being monitored Patient Re-evaluated:Patient Re-evaluated prior to induction Oxygen Delivery Method: Circle system utilized Preoxygenation: Pre-oxygenation with 100% oxygen Induction Type: IV induction, Rapid sequence and Cricoid Pressure applied Ventilation: Mask ventilation with difficulty Laryngoscope Size: Mac and 4 Grade View: Grade II Tube size: 8.0 mm Number of attempts: 1 Airway Equipment and Method: Stylet Placement Confirmation: ETT inserted through vocal cords under direct vision,  positive ETCO2 and breath sounds checked- equal and bilateral Secured at: 24 cm Tube secured with: Tape Dental Injury: Teeth and Oropharynx as per pre-operative assessment  Difficulty Due To: Difficulty was unanticipated and Difficult Airway- due to anterior larynx Future Recommendations: Recommend- induction with short-acting agent, and alternative techniques readily available

## 2017-08-22 NOTE — Progress Notes (Signed)
PROGRESS NOTE   [name]  ZOX:[id number] DOB: October 21, 1970 DOA: 08/19/2017 [facility]   Brief Narrative:  46 year old male with a history of diabetes, presents with left foot wound infection.  Patient reports having chronic blister on the plantar aspect of his left foot for 3-4 months now.  This is been nonhealing.  He has taken antibiotics for this as an outpatient.  MRI imaging indicates possible underlying osteomyelitis.  He is on IV antibiotics.  Podiatry following for possible surgical management.  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 Diabetic foot ulcer on left foot with osteomyelitis.  Continue intravenous antibiotics.  ABIs checked this morning were found to be 0.9 bilaterally.  He is s/p transmetatarsal amputation.   Podiatry is following.  IV abx per podiatry.  2. 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  Started lantus and prandial novolog.     Continue sliding scale.   3. Hypoglycemia - DC 70/30 insulin for now.  Add dextrose to IVFs, monitor closely.   4. Hypokalemia - replacing in IVFs.  Checking magnesium.   5. Chronic pancreatitis related to alcohol abuse.  Continue on Creon capsules.  No complaints of abdominal pain at this time. 6. Alcohol abuse.  Currently on CIWA protocol with Ativan.  No signs of withdrawal at this time. 7. Hyponatremia.  Suspect related to hypovolemia.  Improving with IV fluids.  Continue current treatments. 8. GERD.  Continue on PPI  DVT prophylaxis: Lovenox Code Status: Full code Family Communication: No family present Disposition Plan: SNF   Consultants:   Podiatry  Procedures:   12/6 - transmetatarsal  amputation  Antimicrobials:   Vancomycin 12/3 >  Flagyl 12/3 >  Cefepime 12/3 >  Subjective: Pt seen immediately after surgery.    Objective: Vitals:   08/22/17 1015 08/22/17 1030 08/22/17 1045 08/22/17 1112  BP: (!) 136/95 (!) 132/95 134/76 121/89  Pulse: 92 89 86 86  Resp: 17 10 14 16   Temp:   97.9 F (36.6 C)   TempSrc:      SpO2: 100% 99% 100% 100%  Weight:      Height:        Intake/Output Summary (Last 24 hours) at 08/22/2017 1208 Last data filed at 08/22/2017 1123 Gross per 24 hour  Intake 3560 ml  Output 1850 ml  Net 1710 ml   Filed Weights   08/19/17 1435 08/19/17 2313  Weight: 70.3 kg (155 lb) 69.3 kg (152 lb 12.5 oz)    Examination:  General exam: Appears calm and comfortable  Respiratory system: Clear to auscultation. Respiratory effort normal. Cardiovascular system: S1 & S2 heard, RRR. No JVD, murmurs, rubs, gallops or clicks. No pedal edema. Gastrointestinal system: Abdomen is nondistended, soft and nontender. No organomegaly or masses felt. Normal bowel sounds heard. Central nervous system: Alert and oriented. No focal neurological deficits. Extremities: wound clean, dry, intact Skin: No rashes, lesions or ulcers Psychiatry: Judgement and insight appear normal. Mood & affect appropriate.   Data Reviewed: I have personally reviewed following labs and imaging studies  CBC: Recent Labs  Lab 08/19/17 1905 08/20/17 0446 08/21/17 0508 08/22/17 0510  WBC 17.3* 13.3* 13.0* 8.4  NEUTROABS  12.2* 8.7* 8.3* 4.2  HGB 13.1 12.0* 12.8* 12.8*  HCT 38.6* 35.8* 37.8* 38.4*  MCV 99.7 99.4 97.9 98.5  PLT 339 307 353 337   Basic Metabolic Panel: Recent Labs  Lab 08/19/17 1905 08/20/17 0446 08/21/17 0508 08/21/17 1911 08/22/17 0510  NA 127* 130* 134* 132* 134*  K 3.8 3.8 2.5* 3.8 3.7  CL 95* 101 104 106 104  CO2 15* 17* 22 20* 23  GLUCOSE 272* 184* 46* 180* 242*  BUN 9 9 7 10 6   CREATININE 1.01 0.78 0.64 0.70 0.65  CALCIUM 9.5 9.2 9.2 8.4* 9.1   MG 1.9  --  2.1  --   --    GFR: Estimated Creatinine Clearance: 111.9 mL/min (by C-G formula based on SCr of 0.65 mg/dL). Liver Function Tests: No results for input(s): AST, ALT, ALKPHOS, BILITOT, PROT, ALBUMIN in the last 168 hours. No results for input(s): LIPASE, AMYLASE in the last 168 hours. No results for input(s): AMMONIA in the last 168 hours. Coagulation Profile: No results for input(s): INR, PROTIME in the last 168 hours. Cardiac Enzymes: No results for input(s): CKTOTAL, CKMB, CKMBINDEX, TROPONINI in the last 168 hours. BNP (last 3 results) No results for input(s): PROBNP in the last 8760 hours. HbA1C: Recent Labs    08/20/17 0446  HGBA1C 10.0*   CBG: Recent Labs  Lab 08/21/17 1618 08/21/17 2155 08/22/17 0623 08/22/17 1001 08/22/17 1120  GLUCAP 162* 139* 227* 255* 238*   Lipid Profile: No results for input(s): CHOL, HDL, LDLCALC, TRIG, CHOLHDL, LDLDIRECT in the last 72 hours. Thyroid Function Tests: No results for input(s): TSH, T4TOTAL, FREET4, T3FREE, THYROIDAB in the last 72 hours. Anemia Panel: No results for input(s): VITAMINB12, FOLATE, FERRITIN, TIBC, IRON, RETICCTPCT in the last 72 hours. Sepsis Labs: Recent Labs  Lab 08/19/17 2022  LATICACIDVEN 1.2    Recent Results (from the past 240 hour(s))  Blood Cultures x 2 sites     Status: None (Preliminary result)   Collection Time: 08/19/17  8:22 PM  Result Value Ref Range Status   Specimen Description BLOOD LEFT ARM  Final   Special Requests   Final    BOTTLES DRAWN AEROBIC AND ANAEROBIC Blood Culture results may not be optimal due to an inadequate volume of blood received in culture bottles   Culture NO GROWTH 3 DAYS  Final   Report Status PENDING  Incomplete  Blood Cultures x 2 sites     Status: None (Preliminary result)   Collection Time: 08/19/17  8:26 PM  Result Value Ref Range Status   Specimen Description BLOOD RIGHT HAND  Final   Special Requests   Final    BOTTLES DRAWN AEROBIC AND  ANAEROBIC Blood Culture adequate volume   Culture NO GROWTH 3 DAYS  Final   Report Status PENDING  Incomplete  Surgical pcr screen     Status: None   Collection Time: 08/21/17  8:12 AM  Result Value Ref Range Status   MRSA, PCR NEGATIVE NEGATIVE Final   Staphylococcus aureus NEGATIVE NEGATIVE Final    Comment: (NOTE) The Xpert SA Assay (FDA approved for NASAL specimens in patients 17 years of age and older), is one component of a comprehensive surveillance program. It is not intended to diagnose infection nor to guide or monitor treatment.     Radiology Studies: [place] Arterial Abi (screening Lower Extremity)  Result Date: 08/20/2017 CLINICAL DATA:  Diabetic foot ulceration, osteomyelitis, status post left great toe amputation EXAM: NONINVASIVE PHYSIOLOGIC VASCULAR STUDY OF BILATERAL  LOWER EXTREMITIES TECHNIQUE: Evaluation of both lower extremities were performed at rest, including calculation of ankle-brachial indices with single level Doppler, pressure and pulse volume recording. COMPARISON:  None. FINDINGS: Right ABI:  0.96 Left ABI:  0.96 Right Lower Extremity:  Normal arterial waveforms at the ankle. Left Lower Extremity: Biphasic left posterior tibial waveform and flow. Left dorsalis pedis artery is not successful because of the healing wound. 0.9-0.99 Borderline PAD IMPRESSION: Resting ABIs as above, within the range of borderline peripheral artery disease. Electronically Signed   By: Judie PetitM.  Shick M.D.   On: 08/20/2017 13:33   Dg Foot Complete Left  Result Date: 08/22/2017 CLINICAL DATA:  Left foot transmetatarsal amputation. EXAM: LEFT FOOT - COMPLETE 3+ VIEW COMPARISON:  11/09/ 2018 . FINDINGS: Transmetatarsal amputation. Plaster splint noted . Plaster splint makes evaluation difficult. Drainage catheter noted. Diffuse degenerative change. IMPRESSION: Postsurgical changes left foot with transmetatarsal amputation. Electronically Signed   By: Maisie Fushomas  Register   On: 08/22/2017 11:14     Scheduled Meds: . Melene Muller[START ON 08/23/2017] enoxaparin (LOVENOX) injection  40 mg Subcutaneous Q24H  . feeding supplement (ENSURE ENLIVE)  237 mL Oral BID BM  . folic acid  1 mg Oral Daily  . insulin aspart  0-9 Units Subcutaneous TID WC  . insulin aspart  5 Units Subcutaneous TID WC  . insulin glargine  15 Units Subcutaneous Daily  . lipase/protease/amylase  36,000 Units Oral TID WC & HS  . LORazepam  0-4 mg Intravenous Q12H  . multivitamin with minerals  1 tablet Oral Daily  . nutrition supplement (JUVEN)  1 packet Oral BID BM  . pantoprazole  40 mg Oral Daily  . thiamine  100 mg Oral Daily   Or  . thiamine  100 mg Intravenous Daily   Continuous Infusions: . ceFEPime (MAXIPIME) IV    . dextrose 5 % and 0.9 % NaCl with KCl 20 mEq/L 60 mL/hr at 08/21/17 2200  . lactated ringers 1,000 mL (08/22/17 0642)  . metronidazole 500 mg (08/22/17 1123)  . vancomycin 1,000 mg (08/21/17 2201)    LOS: 3 days   Time spent: 25mins  Standley Dakinslanford Prue Lingenfelter, MD [facility] Pager [phone]  If 7PM-7AM, please contact night-coverage [email or web address] Password [email or web address] 08/22/2017, 12:08 PM

## 2017-08-22 NOTE — Anesthesia Postprocedure Evaluation (Signed)
Anesthesia Post Note  Patient: [name]  Procedure(s) Performed: TRANSMETATARSAL AMPUTATION (amputation of all remaining toes and a portion of the left foot) LEFT FOOT (Left ) LENGTHENING OF THE LEFT ACHILLES TENDON (Left )  Patient location during evaluation: PACU Anesthesia Type: MAC Level of consciousness: awake and alert and patient cooperative Pain management: pain level controlled Vital Signs Assessment: post-procedure vital signs reviewed and stable Respiratory status: spontaneous breathing, nonlabored ventilation and respiratory function stable Cardiovascular status: blood pressure returned to baseline Postop Assessment: no apparent nausea or vomiting Anesthetic complications: no     Last Vitals:  Vitals:   08/22/17 0700 08/22/17 1000  BP: (!) (P) 134/93 (!) 133/95  Pulse:  96  Resp: 20 17  Temp:  36.9 C  SpO2: 100% 100%    Last Pain:  Vitals:   08/22/17 0640  TempSrc: Oral  PainSc: 7                  [name]

## 2017-08-22 NOTE — Transfer of Care (Signed)
Immediate Anesthesia Transfer of Care Note  Patient: Breken L Pusch  Procedure(s) Performed: TRANSMETATARSAL AMPUTATION (amputation of all remaining toes and a portion of the left foot) LEFT FOOT (Left ) LENGTHENING OF THE LEFT ACHILLES TENDON (Left )  Patient Location: PACU  Anesthesia Type:General  Level of Consciousness: awake and patient cooperative  Airway & Oxygen Therapy: Patient Spontanous Breathing and Patient connected to face mask oxygen  Post-op Assessment: Report given to RN, Post -op Vital signs reviewed and stable and Patient moving all extremities  Post vital signs: Reviewed and stable  Last Vitals:  Vitals:   08/22/17 0645 08/22/17 0700  BP: (!) 130/95 (!) (P) 134/93  Pulse:    Resp: 14 20  Temp:    SpO2: 100% 100%    Last Pain:  Vitals:   08/22/17 0640  TempSrc: Oral  PainSc: 7       Patients Stated Pain Goal: 9 (08/22/17 0640)  Complications: No apparent anesthesia complications

## 2017-08-22 NOTE — Op Note (Addendum)
OPERATIVE NOTE  DATE OF PROCEDURE 08/22/2017  SURGEON Dallas SchimkeBenjamin Ivan Corbitt Cloke, DPM  ASSISTANT SURGEON Delton SeePrayash Patel, DPM  OR STAFF Circulator: Lennox Pippinsallas, Debra P, RN Scrub Person: Jonell CluckWitt, Angela M, CST   PREOPERATIVE DIAGNOSIS 1.  Osteomyelitis, left foot 2.  Ulceration, left foot 3.  Diabetes mellitus with peripheral neuropathy  POSTOPERATIVE DIAGNOSIS Same  PROCEDURE 1.  Transmetatarsal amputation, left foot 2.  Percutaneous TAL, left foot  ANESTHESIA General   HEMOSTASIS Pneumatic thigh tourniquet set at 300 mmHg  ESTIMATED BLOOD LOSS ~50 cc  MATERIALS USED TLS drain  INJECTABLES 0.5% Marcaine plain  PATHOLOGY Left forefoot  COMPLICATIONS None   INDICATIONS:  Osteomyelitis of the left foot confirmed on MRI.  DESCRIPTION OF THE PROCEDURE:  The patient was brought to the operating room and placed on the operative table in supine position.  A pneumatic thigh tourniquet was applied to the left lower extremity.  Following induction of general anesthesia, the left lower extremity was scrubbed prepped and draped in the usual sterile manner.  The left foot was elevated and the pneumatic thigh tourniquet inflated to 300 mmHg.  Attention was directed to the dorsal aspect of the left forefoot where a transverse curvilinear incision was made.  A second incision was performed along the plantar aspect of the left foot just proximal to the web spaces in a similar fashion.  The dorsal incision was continued through the soft tissue down to the metatarsalat shafts.  Using a sagittal saw a transverse through and through osteotomy of metatarsals 1 through 5 were performed.  The osteotomies were performed creating a parabolic shape to the length of the metatarsals.  All bone edges were smoothed with a power rasp.  The amputation was completed creating a healthy flap plantarly.  The specimen was passed off the field.  No purulence was encountered.  The remaining tissues were evaluated and  appeared viable.  The thigh tourniquet was released.  Hemostasis was obtained with electrocautery and 3-0 Vicryl.  The pneumatic thigh tourniquet was reinflated to 300 mmHg.  Subcutaneous structures were reapproximated using 3-0 Vicryl.  The skin was reapproximated using 3-0 nylon.  Attention was directed to the posterior aspect of the left heel.  3 percutaneous hemisections of the Achilles tendon were performed.  2 sections were performed medially and 1 laterally.  The equinus deformity reduced.  I was able to dorsiflex the foot to 15.  Each incision was reapproximated using 3-0 nylon.  A sterile compressive dressing was applied to the left lower extremity.  The pneumatic thigh tourniquet was released.  A high ankle block of the left lower extremity was performed using 0.5% Marcaine plain.  A posterior splint was applied to the left lower extremity with the ankle joint dorsiflexed.    The patient tolerated the procedures well.  The patient was then transferred to PACU with vital signs stable.

## 2017-08-22 NOTE — Brief Op Note (Signed)
BRIEF OPERATIVE NOTE  DATE OF PROCEDURE 08/22/2017  SURGEON Dallas SchimkeBenjamin Ivan Deakon Frix, DPM  ASSISTANT SURGEON Delton SeePrayash Patel, DPM  OR STAFF Circulator: Lennox Pippinsallas, Debra P, RN Scrub Person: Jonell CluckWitt, Angela M, CST   PREOPERATIVE DIAGNOSIS 1.  Osteomyelitis, left foot 2.  Ulceration, left foot 3.  Diabetes mellitus with peripheral neuropathy  POSTOPERATIVE DIAGNOSIS Same  PROCEDURE 1.  Transmetatarsal amputation, left foot 2.  Percutaneous TAL, left foot  ANESTHESIA General   HEMOSTASIS Pneumatic thigh tourniquet set at 350 mmHg  ESTIMATED BLOOD LOSS ~50 cc  MATERIALS USED TLS drain  INJECTABLES 0.5% Marcaine plain  PATHOLOGY Left forefoot  COMPLICATIONS None

## 2017-08-22 NOTE — Progress Notes (Signed)
Podiatry Progress Note  Subjective [name] scheduled for TMA with TAL of left lower extremity this AM.  Feeling okay.  No new complaints.  Has been NPO since midnight.  Objective Vital signs in last 24 hours:   Temp:  [98.2 F (36.8 C)-99.4 F (37.4 C)] 98.7 F (37.1 C) (12/06 0640) Pulse Rate:  [70-96] 70 (12/06 0535) Resp:  [14-20] 20 (12/06 0700) BP: (114-145)/(94-106) (P) 134/93 (12/06 0700) SpO2:  [100 %] 100 % (12/06 0700)  DRESSING:  Clean, dry and intact without strikethrough.  Lab/Test Results  Recent Labs    08/21/17 0508 08/21/17 1911 08/22/17 0510  WBC 13.0*  --  8.4  HGB 12.8*  --  12.8*  HCT 37.8*  --  38.4*  PLT 353  --  337  NA 134* 132* 134*  K 2.5* 3.8 3.7  CL 104 106 104  CO2 22 20* 23  BUN 7 10 6   CREATININE 0.64 0.70 0.65  GLUCOSE 46* 180* 242*  CALCIUM 9.2 8.4* 9.1    Recent Results (from the past 240 hour(s))  Blood Cultures x 2 sites     Status: None (Preliminary result)   Collection Time: 08/19/17  8:22 PM  Result Value Ref Range Status   Specimen Description BLOOD LEFT ARM  Final   Special Requests   Final    BOTTLES DRAWN AEROBIC AND ANAEROBIC Blood Culture results may not be optimal due to an inadequate volume of blood received in culture bottles   Culture NO GROWTH 3 DAYS  Final   Report Status PENDING  Incomplete  Blood Cultures x 2 sites     Status: None (Preliminary result)   Collection Time: 08/19/17  8:26 PM  Result Value Ref Range Status   Specimen Description BLOOD RIGHT HAND  Final   Special Requests   Final    BOTTLES DRAWN AEROBIC AND ANAEROBIC Blood Culture adequate volume   Culture NO GROWTH 3 DAYS  Final   Report Status PENDING  Incomplete  Surgical pcr screen     Status: None   Collection Time: 08/21/17  8:12 AM  Result Value Ref Range Status   MRSA, PCR NEGATIVE NEGATIVE Final   Staphylococcus aureus NEGATIVE NEGATIVE Final    Comment: (NOTE) The Xpert SA Assay (FDA approved for NASAL specimens in patients  47 years of age and older), is one component of a comprehensive surveillance program. It is not intended to diagnose infection nor to guide or monitor treatment.      [place] Arterial Abi (screening Lower Extremity)  Result Date: 08/20/2017 CLINICAL DATA:  Diabetic foot ulceration, osteomyelitis, status post left great toe amputation EXAM: NONINVASIVE PHYSIOLOGIC VASCULAR STUDY OF BILATERAL LOWER EXTREMITIES TECHNIQUE: Evaluation of both lower extremities were performed at rest, including calculation of ankle-brachial indices with single level Doppler, pressure and pulse volume recording. COMPARISON:  None. FINDINGS: Right ABI:  0.96 Left ABI:  0.96 Right Lower Extremity:  Normal arterial waveforms at the ankle. Left Lower Extremity: Biphasic left posterior tibial waveform and flow. Left dorsalis pedis artery is not successful because of the healing wound. 0.9-0.99 Borderline PAD IMPRESSION: Resting ABIs as above, within the range of borderline peripheral artery disease. Electronically Signed   By: Judie PetitM.  Shick M.D.   On: 08/20/2017 13:33    Medications Scheduled Meds: . [MAR Hold] enoxaparin (LOVENOX) injection  40 mg Subcutaneous Q24H  . [MAR Hold] feeding supplement (ENSURE ENLIVE)  237 mL Oral BID BM  . [MAR Hold] folic acid  1  mg Oral Daily  . [MAR Hold] insulin aspart  0-9 Units Subcutaneous TID WC  . [MAR Hold] insulin aspart  5 Units Subcutaneous TID WC  . [MAR Hold] insulin glargine  15 Units Subcutaneous Daily  . [MAR Hold] lipase/protease/amylase  36,000 Units Oral TID WC & HS  . [MAR Hold] LORazepam  0-4 mg Intravenous Q12H  . [MAR Hold] multivitamin with minerals  1 tablet Oral Daily  . [MAR Hold] nutrition supplement (JUVEN)  1 packet Oral BID BM  . [MAR Hold] pantoprazole  40 mg Oral Daily  . [MAR Hold] thiamine  100 mg Oral Daily   Or  . [MAR Hold] thiamine  100 mg Intravenous Daily   Continuous Infusions: . [MAR Hold] ceFEPime (MAXIPIME) IV 2 g (08/21/17 2201)  . dextrose 5 %  and 0.9 % NaCl with KCl 20 mEq/L 60 mL/hr at 08/21/17 2200  . lactated ringers 1,000 mL (08/22/17 0642)  . [MAR Hold] metronidazole 500 mg (08/22/17 0430)  . [MAR Hold] vancomycin 1,000 mg (08/21/17 2201)   PRN Meds:.[MAR Hold] HYDROcodone-acetaminophen, [MAR Hold] LORazepam **OR** [MAR Hold] LORazepam  Assessment 1. 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 2. Cellulitis, left foot. 3. Non-healing ulceration, left foot. 4. Diabetes mellitus with peripheral neuropathy.   Plan Lovenox was held.  Proceed with TMA and TAL as planned.    Kirby Argueta IVAN 08/22/2017, 7:35 AM

## 2017-08-22 NOTE — Anesthesia Preprocedure Evaluation (Addendum)
Anesthesia Evaluation  Patient identified by MRN, date of birth, ID band Patient awake    Airway Mallampati: I  TM Distance: >3 FB Neck ROM: Full    Dental  (+) Teeth Intact   Pulmonary Current Smoker,    Pulmonary exam normal        Cardiovascular Exercise Tolerance: Poor hypertension,  Rhythm:Regular Rate:Normal  Normal sinus rhythm Possible Anterior infarct , age undetermined Abnormal ECG   (2018, recent)   Neuro/Psych Anxiety    GI/Hepatic GERD  Medicated and Controlled,  Endo/Other  diabetes, Poorly Controlled, Type 1  Renal/GU Results for [name], [name] (MRN [id number]) as of 08/22/2017 06:42  08/22/2017 05:10 Sodium: 134 ([name]) Potassium: 3.7 Chloride: 104 CO2: 23 Glucose: 242 (H) BUN: 6 Creatinine: 0.65      Musculoskeletal   Abdominal   Peds  Hematology Results for [name], [name] (MRN [id number]) as of 08/22/2017 06:42  08/22/2017 05:10 RBC: 3.90 ([name]) Hemoglobin: 12.8 ([name]) HCT: 38.4 ([name]) MCV: 98.5 MCH: 32.8 MCHC: 33.3 RDW: 11.9 Platelets: 337    Anesthesia Other Findings   Reproductive/Obstetrics                             Anesthesia Physical Anesthesia Plan  ASA: III  Anesthesia Plan: General   Post-op Pain Management:    Induction: Intravenous  PONV Risk Score and Plan:   Airway Management Planned: Oral ETT  Additional Equipment:   Intra-op Plan:   Post-operative Plan: Extubation in OR  Informed Consent: I have reviewed the patients History and Physical, chart, labs and discussed the procedure including the risks, benefits and alternatives for the proposed anesthesia with the patient or authorized representative who has indicated his/her understanding and acceptance.   Dental advisory given  Plan Discussed with: CRNA and Surgeon  Anesthesia Plan Comments:       Anesthesia Quick Evaluation

## 2017-08-22 NOTE — Progress Notes (Signed)
Transported from pacu after left transmetatarsil amputation and achilles tendon lengthening.  Foot wrapped in soft cast, kerlix and ace and TLS drain attached which has bloody drainage.  Alert and oriented and vitals stable

## 2017-08-22 NOTE — Progress Notes (Signed)
Pharmacy Antibiotic Note  Henri MedalJerel L Check is a 46 y.o. male admitted on 08/19/2017 with diabetic foot infection/osteomyelitis.  Pharmacy has been consulted for Vancomycin and Cefepime dosing. Vancomycin trough this AM was 3617mcg/ml. Therapeutic for osteomyelitis. Patient had transmetatarsal amputation of left foot 08/22/17.  Plan: Continue Vancomycin 1gm IV q8h. Goal trough 15-6120mcg/ml Increase Cefepime 2gm IV q8h Also, on Flagyl 500mg  IV q8h F/U LOT  Continue to monitor clinical progress and labs  Height: 6\' 1"  (185.4 cm) Weight: 152 lb 12.5 oz (69.3 kg) IBW/kg (Calculated) : 79.9  Temp (24hrs), Avg:98.7 F (37.1 C), Min:97.9 F (36.6 C), Max:99.4 F (37.4 C)  Recent Labs  Lab 08/19/17 1905 08/19/17 2022 08/20/17 0446 08/21/17 0508 08/21/17 1911 08/22/17 0510  WBC 17.3*  --  13.3* 13.0*  --  8.4  CREATININE 1.01  --  0.78 0.64 0.70 0.65  LATICACIDVEN  --  1.2  --   --   --   --   VANCOTROUGH  --   --   --   --   --  17    Estimated Creatinine Clearance: 111.9 mL/min (by C-G formula based on SCr of 0.65 mg/dL).    No Known Allergies  Antimicrobials this admission: Vanc 12/3 >>  Cefepime 12/3 >>  Flagyl 12/3 >>  Microbiology results:  12/3 BCx: ngtd  12/3 MRSA PCR: negative  Thank you for allowing pharmacy to be a part of this patient's care.  Elder CyphersLorie Neesha Langton, BS Pharm D, [place] Clinical Pharmacist Pager [phone] 08/22/2017 11:51 AM

## 2017-08-22 NOTE — Progress Notes (Signed)
Inpatient Diabetes Program Recommendations  AACE/ADA: New Consensus Statement on Inpatient Glycemic Control (2015)  Target Ranges:  Prepandial:   less than 140 mg/dL      Peak postprandial:   less than 180 mg/dL (1-2 hours)      Critically ill patients:  140 - 180 mg/dL   Results for [name], [name] (MRN [id number]) as of 08/22/2017 13:23  Ref. Range 08/21/2017 07:32 08/21/2017 08:28 08/21/2017 11:21 08/21/2017 16:18 08/21/2017 21:55 08/22/2017 06:23 08/22/2017 10:01 08/22/2017 11:20  Glucose-Capillary Latest Ref Range: 65 - 99 mg/dL 42 (LL) 782155 (H) 956272 (H) 162 (H) 139 (H) 227 (H) 255 (H) 238 (H)  Results for [name], [name] (MRN [id number]) as of 08/22/2017 13:23  Ref. Range 08/20/2017 04:46  Hemoglobin A1C Latest Ref Range: 4.8 - 5.6 % 10.0 (H)   Review of Glycemic Control  Outpatient Diabetes medications: 70/30 90 units QAM with breakfast, 70/30 80 units with supper Current orders for Inpatient glycemic control: Lantus 18 units QHS, Novolog 6 units TID with meals, Novolog 0-9 units TID with meals  Inpatient Diabetes Program Recommendations: HgbA1C: A1C 10.0% on 08/20/17 indicating an average glucose fo 240 mg/dl over the past 2-3 months.  NOTE: Spoke with patient about diabetes and home regimen for diabetes control. Patient reports that he is followed by Dr. Fransico HimNida for diabetes management and currently he takes 70/30 90 units QAM with breakfast and 70/30 80 units with supper as an outpatient for diabetes control. Patient reports that he has been on Levemir and Novolog in the past but was changed to 70/30 BID about one year ago by Dr. Fransico HimNida. Patient reports that he is taking insulin consistently as prescribed and that he does not forget to take insulin. Patient states that he checks his glucose 3 times per day and that it is usually in the 200-300's mg/dl range.  Patient denies issues with hypoglycemia. Patient was given 70/30 90 units at 9:02 am on 12/4 and 70/30 60 units at 18:26 on 12/4 and the following  morning fasting glucose was noted to be 42 mg/dl on 46/05/6211/5/18 at 9:527:32 am.  Therefore, 70/30 was stopped and Lantus and Novolog (meal coverage and correction) was ordered for inpatient glycemic control. Discussed A1C results (10.0% on 08/20/17) and explained that his current A1C indicates an average glucose of 240 mg/dl over the past 2-3 months. Discussed glucose and A1C goals. Discussed importance of checking CBGs and maintaining good CBG control to prevent long-term and short-term complications. Explained how hyperglycemia leads to damage within blood vessels which lead to the common complications seen with uncontrolled diabetes. Stressed to the patient the importance of improving glycemic control to prevent further complications from uncontrolled diabetes especially post op to decrease risk of complications and increase wound healing. Discussed impact of nutrition, exercise, stress, sickness, and medications on diabetes control. Encouraged patient to check his glucose 4 times per day (before meals and at bedtime) and to keep a log book of glucose readings and insulin taken which he will need to take to doctor appointments. Explained how important it was that he provide information to Dr. Fransico HimNida so he could make appropriate insulin adjustments if needed. Patient verbalized understanding of information discussed and he states that he has no further questions at this time related to diabetes.  Thanks, Orlando PennerMarie Sinclair Alligood, RN, MSN, CDE Diabetes Coordinator Inpatient Diabetes Program [phone] (Team Pager)

## 2017-08-23 ENCOUNTER — Encounter (HOSPITAL_COMMUNITY): Payer: Self-pay | Admitting: Podiatry

## 2017-08-23 LAB — BASIC METABOLIC PANEL
Anion gap: 5 (ref 5–15)
BUN: 5 mg/dL — AB (ref 6–20)
CALCIUM: 8.5 mg/dL — AB (ref 8.9–10.3)
CO2: 25 mmol/L (ref 22–32)
Chloride: 103 mmol/L (ref 101–111)
Creatinine, Ser: 0.52 mg/dL — ABNORMAL LOW (ref 0.61–1.24)
Glucose, Bld: 178 mg/dL — ABNORMAL HIGH (ref 65–99)
Potassium: 3.3 mmol/L — ABNORMAL LOW (ref 3.5–5.1)
SODIUM: 133 mmol/L — AB (ref 135–145)

## 2017-08-23 LAB — GLUCOSE, CAPILLARY
GLUCOSE-CAPILLARY: 197 mg/dL — AB (ref 65–99)
GLUCOSE-CAPILLARY: 283 mg/dL — AB (ref 65–99)
Glucose-Capillary: 160 mg/dL — ABNORMAL HIGH (ref 65–99)
Glucose-Capillary: 267 mg/dL — ABNORMAL HIGH (ref 65–99)

## 2017-08-23 LAB — CBC WITH DIFFERENTIAL/PLATELET
BASOS PCT: 0 %
Basophils Absolute: 0 10*3/uL (ref 0.0–0.1)
EOS ABS: 0.1 10*3/uL (ref 0.0–0.7)
EOS PCT: 1 %
HCT: 33.1 % — ABNORMAL LOW (ref 39.0–52.0)
HEMOGLOBIN: 11.1 g/dL — AB (ref 13.0–17.0)
Lymphocytes Relative: 17 %
Lymphs Abs: 2 10*3/uL (ref 0.7–4.0)
MCH: 33.1 pg (ref 26.0–34.0)
MCHC: 33.5 g/dL (ref 30.0–36.0)
MCV: 98.8 fL (ref 78.0–100.0)
MONO ABS: 2.7 10*3/uL — AB (ref 0.1–1.0)
MONOS PCT: 24 %
NEUTROS PCT: 58 %
Neutro Abs: 6.7 10*3/uL (ref 1.7–7.7)
PLATELETS: 357 10*3/uL (ref 150–400)
RBC: 3.35 MIL/uL — ABNORMAL LOW (ref 4.22–5.81)
RDW: 12 % (ref 11.5–15.5)
WBC: 11.5 10*3/uL — ABNORMAL HIGH (ref 4.0–10.5)

## 2017-08-23 MED ORDER — INSULIN GLARGINE 100 UNIT/ML ~~LOC~~ SOLN
20.0000 [IU] | Freq: Every day | SUBCUTANEOUS | Status: DC
  Administered 2017-08-23 – 2017-08-24 (×2): 20 [IU] via SUBCUTANEOUS
  Filled 2017-08-23 (×4): qty 0.2

## 2017-08-23 MED ORDER — SODIUM CHLORIDE 0.9% FLUSH
3.0000 mL | Freq: Two times a day (BID) | INTRAVENOUS | Status: DC
  Administered 2017-08-23 – 2017-08-24 (×2): 3 mL via INTRAVENOUS

## 2017-08-23 MED ORDER — GLUCERNA SHAKE PO LIQD
237.0000 mL | Freq: Two times a day (BID) | ORAL | Status: DC
  Administered 2017-08-23 – 2017-08-24 (×3): 237 mL via ORAL

## 2017-08-23 MED ORDER — SODIUM CHLORIDE 0.9% FLUSH: 3.0000 mL | INTRAVENOUS | Status: DC | PRN

## 2017-08-23 MED ORDER — POTASSIUM CHLORIDE CRYS ER 20 MEQ PO TBCR
40.0000 meq | EXTENDED_RELEASE_TABLET | Freq: Once | ORAL | Status: AC
  Administered 2017-08-23: 40 meq via ORAL
  Filled 2017-08-23: qty 2

## 2017-08-23 MED ORDER — SODIUM CHLORIDE 0.9 % IV SOLN
250.0000 mL | INTRAVENOUS | Status: DC | PRN
  Administered 2017-08-23: 250 mL via INTRAVENOUS

## 2017-08-23 NOTE — Progress Notes (Signed)
PROGRESS NOTE   [name]  JXB:[id number] DOB: Sep 24, 1970 DOA: 08/19/2017 PCP: [facility]   Brief Narrative:  46 year old male with a history of diabetes, presents with left foot wound infection.  Patient reports having chronic blister on the plantar aspect of his left foot for 3-4 months now.  This is been nonhealing.  He has taken antibiotics for this as an outpatient.  MRI imaging indicates possible underlying osteomyelitis.  He is on IV antibiotics.  Podiatry following for possible surgical management.  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 Diabetic foot ulcer on left foot with osteomyelitis.  Continue intravenous antibiotics.  ABIs were found to be 0.9 bilaterally.  He is s/p transmetatarsal amputation.   Podiatry is following.  IV abx per podiatry. They plan to recheck wound 12/8.   2. 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  Started lantus and prandial novolog.     Continue sliding scale.   3. Hypoglycemia - resolved now.  DC 70/30 insulin for now. 4. Hypokalemia - replacing orally.  Magnesium was 2.1.   5. Chronic pancreatitis related to alcohol abuse.  Continue on Creon capsules.  No complaints of abdominal pain at this time. 6. Alcohol abuse.  Currently on CIWA protocol with Ativan.  No signs of withdrawal at this time. 7. Hyponatremia.  Suspect related to hypovolemia.  Improving with IV fluids.  Continue current treatments. 8. GERD.  Continue on PPI  DVT prophylaxis: Lovenox Code Status: Full code Family Communication: No family present Disposition Plan: SNF   Consultants:   Podiatry  Procedures:   12/6 - transmetatarsal amputation  Antimicrobials:     Vancomycin 12/3 >  Flagyl 12/3 >  Cefepime 12/3 >  Subjective: Pt tolerated surgery well, he did have some fever overnight and some pain but tolerable    Objective: Vitals:   08/22/17 1402 08/22/17 1919 08/22/17 2000 08/23/17 0539  BP: 121/86 122/87 125/80 121/80  Pulse: 94 99 98 90  Resp: 16 16 20 20   Temp: 97.9 F (36.6 C) 100.3 F (37.9 C) (!) 101 F (38.3 C) 99.1 F (37.3 C)  TempSrc: Oral Oral Oral Oral  SpO2: 100% 100% 98% 100%  Weight:      Height:        Intake/Output Summary (Last 24 hours) at 08/23/2017 1415 Last data filed at 08/23/2017 1256 Gross per 24 hour  Intake 2725.17 ml  Output 2050 ml  Net 675.17 ml   Filed Weights   08/19/17 1435 08/19/17 2313  Weight: 70.3 kg (155 lb) 69.3 kg (152 lb 12.5 oz)    Examination:  General exam: Appears calm and comfortable  Respiratory system: Clear to auscultation. Respiratory effort normal. Cardiovascular system: S1 & S2 heard, RRR. No JVD, murmurs, rubs, gallops or clicks. No pedal edema. Gastrointestinal system: Abdomen is nondistended, soft and nontender. No organomegaly or masses felt. Normal bowel sounds heard. Central nervous system: Alert and oriented. No focal neurological deficits. Extremities: wound clean, dry, intact Skin: No rashes, lesions or ulcers Psychiatry: Judgement and insight appear normal. Mood & affect appropriate.   Data Reviewed: I have personally reviewed following labs and imaging studies  CBC: Recent Labs  Lab 08/19/17 1905 08/20/17 0446 08/21/17 0508 08/22/17 0510 08/23/17 0547  WBC 17.3* 13.3* 13.0* 8.4 11.5*  NEUTROABS 12.2* 8.7* 8.3* 4.2 6.7  HGB 13.1 12.0* 12.8* 12.8* 11.1*  HCT 38.6* 35.8* 37.8* 38.4* 33.1*  MCV 99.7 99.4 97.9 98.5 98.8  PLT 339 307 353 337 357   Basic Metabolic Panel: Recent Labs  Lab 08/19/17 1905 08/20/17 0446 08/21/17 0508 08/21/17 1911 08/22/17 0510 08/23/17 0547  NA 127* 130* 134* 132* 134* 133*  K 3.8 3.8 2.5* 3.8 3.7 3.3*  CL 95*  101 104 106 104 103  CO2 15* 17* 22 20* 23 25  GLUCOSE 272* 184* 46* 180* 242* 178*  BUN 9 9 7 10 6  5*  CREATININE 1.01 0.78 0.64 0.70 0.65 0.52*  CALCIUM 9.5 9.2 9.2 8.4* 9.1 8.5*  MG 1.9  --  2.1  --   --   --    GFR: Estimated Creatinine Clearance: 111.9 mL/min (A) (by C-G formula based on SCr of 0.52 mg/dL (L)). Liver Function Tests: No results for input(s): AST, ALT, ALKPHOS, BILITOT, PROT, ALBUMIN in the last 168 hours. No results for input(s): LIPASE, AMYLASE in the last 168 hours. No results for input(s): AMMONIA in the last 168 hours. Coagulation Profile: No results for input(s): INR, PROTIME in the last 168 hours. Cardiac Enzymes: No results for input(s): CKTOTAL, CKMB, CKMBINDEX, TROPONINI in the last 168 hours. BNP (last 3 results) No results for input(s): PROBNP in the last 8760 hours. HbA1C: No results for input(s): HGBA1C in the last 72 hours. CBG: Recent Labs  Lab 08/22/17 1120 08/22/17 1621 08/22/17 2055 08/23/17 0734 08/23/17 1109  GLUCAP 238* 227* 233* 197* 283*   Lipid Profile: No results for input(s): CHOL, HDL, LDLCALC, TRIG, CHOLHDL, LDLDIRECT in the last 72 hours. Thyroid Function Tests: No results for input(s): TSH, T4TOTAL, FREET4, T3FREE, THYROIDAB in the last 72 hours. Anemia Panel: No results for input(s): VITAMINB12, FOLATE, FERRITIN, TIBC, IRON, RETICCTPCT in the last 72 hours. Sepsis Labs: Recent Labs  Lab 08/19/17 2022  LATICACIDVEN 1.2    Recent Results (from the past 240 hour(s))  Blood Cultures x 2 sites     Status: None (Preliminary result)   Collection Time: 08/19/17  8:22 PM  Result Value Ref Range Status   Specimen Description BLOOD LEFT ARM  Final   Special Requests   Final    BOTTLES DRAWN AEROBIC AND ANAEROBIC Blood Culture results may not be optimal due to an inadequate volume of blood received in culture bottles   Culture NO GROWTH 4 DAYS  Final   Report Status PENDING  Incomplete  Blood Cultures x 2 sites     Status:  None (Preliminary result)   Collection Time: 08/19/17  8:26 PM  Result Value Ref Range Status   Specimen Description BLOOD RIGHT HAND  Final   Special Requests   Final    BOTTLES DRAWN AEROBIC AND ANAEROBIC Blood Culture adequate volume   Culture NO GROWTH 4 DAYS  Final   Report Status PENDING  Incomplete  Surgical pcr screen     Status: None   Collection Time: 08/21/17  8:12 AM  Result Value Ref Range Status   MRSA, PCR NEGATIVE NEGATIVE Final   Staphylococcus aureus NEGATIVE NEGATIVE Final    Comment: (NOTE) The Xpert SA Assay (FDA approved for NASAL specimens in patients 47 years of age and older), is one component of a comprehensive surveillance program. It is not intended to diagnose infection nor to guide or  monitor treatment.     Radiology Studies: Dg Foot Complete Left  Result Date: 08/22/2017 CLINICAL DATA:  Left foot transmetatarsal amputation. EXAM: LEFT FOOT - COMPLETE 3+ VIEW COMPARISON:  11/09/ 2018 . FINDINGS: Transmetatarsal amputation. Plaster splint noted . Plaster splint makes evaluation difficult. Drainage catheter noted. Diffuse degenerative change. IMPRESSION: Postsurgical changes left foot with transmetatarsal amputation. Electronically Signed   By: Maisie Fushomas  Register   On: 08/22/2017 11:14    Scheduled Meds: . enoxaparin (LOVENOX) injection  40 mg Subcutaneous Q24H  . feeding supplement (GLUCERNA SHAKE)  237 mL Oral BID BM  . folic acid  1 mg Oral Daily  . insulin aspart  0-9 Units Subcutaneous TID WC  . insulin aspart  6 Units Subcutaneous TID WC  . insulin glargine  20 Units Subcutaneous Daily  . lipase/protease/amylase  36,000 Units Oral TID WC & HS  . LORazepam  0-4 mg Intravenous Q12H  . multivitamin with minerals  1 tablet Oral Daily  . nutrition supplement (JUVEN)  1 packet Oral BID BM  . pantoprazole  40 mg Oral Daily  . potassium chloride  40 mEq Oral Once  . thiamine  100 mg Oral Daily   Or  . thiamine  100 mg Intravenous Daily   Continuous  Infusions: . ceFEPime (MAXIPIME) IV Stopped (08/23/17 0730)  . lactated ringers Stopped (08/22/17 1221)  . metronidazole 500 mg (08/23/17 1254)  . vancomycin Stopped (08/23/17 0716)    LOS: 4 days   Time spent: 25 mins  Standley Dakinslanford Kileigh Ortmann, MD [facility] Pager [phone]  If 7PM-7AM, please contact night-coverage [email or web address] Password [email or web address] 08/23/2017, 2:15 PM

## 2017-08-23 NOTE — Evaluation (Signed)
Physical Therapy Evaluation Patient Details Name: [name] MRN: [id number] DOB: [birth year] Today's Date: 08/23/2017   History of Present Illness  46 yo male with onset of infection on L foot created need for transmet amputation, from diabetic ulcer and osteomyelitis.  PMHx:  pancreatitis, EtOH abuse, DM, GERD, HTN, prev L foot amp of first toe and R fifth toe,   Clinical Impression  Pt was able to walk a short trip with NWB on LLE but is definitely requiring assistance to maintain safety.  Asked pt to go through exercises to increase ROM to ankles and strengthen LE's and will anticipate he dc to SNF when finished with acute medical care.  Follow acutely for strength and balance to increase safety at home and shorten SNF stay.    Follow Up Recommendations SNF    Equipment Recommendations  None recommended by PT    Recommendations for Other Services       Precautions / Restrictions Precautions Precautions: Fall Required Braces or Orthoses: Other Brace/Splint(has a protective hard splint on posterior L ankle and foot) Restrictions Weight Bearing Restrictions: Yes LLE Weight Bearing: Non weight bearing      Mobility  Bed Mobility Overal bed mobility: Needs Assistance Bed Mobility: Supine to Sit;Sit to Supine     Supine to sit: Min guard Sit to supine: Min assist      Transfers Overall transfer level: Needs assistance Equipment used: Rolling walker (2 wheeled);1 person hand held assist Transfers: Sit to/from Stand Sit to Stand: Min assist         General transfer comment: needs min assist to power up   Ambulation/Gait Ambulation/Gait assistance: Min assist Ambulation Distance (Feet): 75 Feet Assistive device: Rolling walker (2 wheeled);1 person hand held assist   Gait velocity: reduced Gait velocity interpretation: Below normal speed for age/gender General Gait Details: hops on RLE with RW and maintaining NWB on LLE with minor verbal and tactile  cues  Stairs            Wheelchair Mobility    Modified Rankin (Stroke Patients Only)       Balance Overall balance assessment: Needs assistance Sitting-balance support: Feet supported Sitting balance-Leahy Scale: Good     Standing balance support: Bilateral upper extremity supported;During functional activity Standing balance-Leahy Scale: Poor                               Pertinent Vitals/Pain Pain Assessment: 0-10 Pain Score: 7  Pain Location: L foot Pain Descriptors / Indicators: Operative site guarding Pain Intervention(s): Limited activity within patient's tolerance;Monitored during session;Premedicated before session;Repositioned    Home Living Family/patient expects to be discharged to:: Private residence Living Arrangements: Other relatives Available Help at Discharge: Family;Available 24 hours/day Type of Home: House Home Access: Stairs to enter   Enter[employer] CorporationEntrance Stairs-Number of Steps: 3 Home Layout: Laundry or work area in basement;Two level;Able to live on main level with bedroom/bathroom Home Equipment: Crutches;Shower seat Additional Comments: has expectation of going to SNF    Prior Function Level of Independence: Independent               Hand Dominance   Dominant Hand: Right    Extremity/Trunk Assessment   Upper Extremity Assessment Upper Extremity Assessment: Overall WFL for tasks assessed    Lower Extremity Assessment Lower Extremity Assessment: Generalized weakness    Cervical / Trunk Assessment Cervical / Trunk Assessment: Normal  Communication   Communication: No difficulties  Cognition Arousal/Alertness: Awake/alert Behavior During Therapy: WFL for tasks assessed/performed Overall Cognitive Status: Within Functional Limits for tasks assessed                                        General Comments General comments (skin integrity, edema, etc.): clean bandage on L foot with drain in place, left  on elevated surface after therapy    Exercises Other Exercises Other Exercises: instructed SLR's, hip abd/add, quad sets, glut sets and ankle pumps   Assessment/Plan    PT Assessment Patient needs continued PT services  PT Problem List Decreased strength;Decreased range of motion;Decreased activity tolerance;Decreased balance;Decreased mobility;Decreased coordination;Decreased knowledge of use of DME;Decreased safety awareness;Cardiopulmonary status limiting activity;Decreased skin integrity;Pain       PT Treatment Interventions DME instruction;Gait training;Functional mobility training;Therapeutic activities;Therapeutic exercise;Balance training;Neuromuscular re-education;Patient/family education    PT Goals (Current goals can be found in the Care Plan section)  Acute Rehab PT Goals Patient Stated Goal: to get stronger and get home PT Goal Formulation: With patient Time For Goal Achievement: 09/06/17 Potential to Achieve Goals: Good    Frequency Min 3X/week   Barriers to discharge        Co-evaluation               AM-PAC PT "6 Clicks" Daily Activity  Outcome Measure Difficulty turning over in bed (including adjusting bedclothes, sheets and blankets)?: A Little Difficulty moving from lying on back to sitting on the side of the bed? : A Little Difficulty sitting down on and standing up from a chair with arms (e.g., wheelchair, bedside commode, etc,.)?: Unable Help needed moving to and from a bed to chair (including a wheelchair)?: A Little Help needed walking in hospital room?: A Little Help needed climbing 3-5 steps with a railing? : A Little 6 Click Score: 16    End of Session Equipment Utilized During Treatment: Gait belt Activity Tolerance: Patient tolerated treatment well;Patient limited by fatigue Patient left: in bed;with call bell/phone within reach Nurse Communication: Mobility status PT Visit Diagnosis: Unsteadiness on feet (R26.81);Muscle weakness  (generalized) (M62.81);Difficulty in walking, not elsewhere classified (R26.2);Pain Pain - Right/Left: Left Pain - part of body: Ankle and joints of foot    Time: 1425-1453 PT Time Calculation (min) (ACUTE ONLY): 28 min   Charges:   PT Evaluation $PT Eval Moderate Complexity: 1 Mod PT Treatments $Gait Training: 8-22 mins   PT G Codes:   PT G-Codes **NOT FOR INPATIENT CLASS** Functional Assessment Tool Used: AM-PAC 6 Clicks Basic Mobility    [name] 08/23/2017, 5:47 PM   Samul Dadauth Kingston Shawgo, PT MS Acute Rehab Dept. Number: [facility] [phone] and [facility] [phone]

## 2017-08-23 NOTE — Progress Notes (Signed)
Podiatry Progress Note  Subjective [name] POD 1 S/P TMA with TAL of left foot.  Pain controlled with medication.  No nausea, vomiting, chills, chest pain or shortness of pain.  Objective Vital signs in last 24 hours:   Temp:  [97.9 F (36.6 C)-101 F (38.3 C)] 99.1 F (37.3 C) (12/07 0539) Pulse Rate:  [90-99] 90 (12/07 0539) Resp:  [16-20] 20 (12/07 0539) BP: (121-125)/(80-87) 121/80 (12/07 0539) SpO2:  [98 %-100 %] 100 % (12/07 0539)  DRESSING:  Dressing and splint clean, dry and intact.  No strikethrough. DRAIN:  Intact with blood in vacutainer. MUSCULOSKELETAL:  Calves are soft and nontender bilaterally.  Lab/Test Results  Recent Labs    08/22/17 0510 08/23/17 0547  WBC 8.4 11.5*  HGB 12.8* 11.1*  HCT 38.4* 33.1*  PLT 337 357  NA 134* 133*  K 3.7 3.3*  CL 104 103  CO2 23 25  BUN 6 5*  CREATININE 0.65 0.52*  GLUCOSE 242* 178*  CALCIUM 9.1 8.5*    Recent Results (from the past 240 hour(s))  Blood Cultures x 2 sites     Status: None (Preliminary result)   Collection Time: 08/19/17  8:22 PM  Result Value Ref Range Status   Specimen Description BLOOD LEFT ARM  Final   Special Requests   Final    BOTTLES DRAWN AEROBIC AND ANAEROBIC Blood Culture results may not be optimal due to an inadequate volume of blood received in culture bottles   Culture NO GROWTH 4 DAYS  Final   Report Status PENDING  Incomplete  Blood Cultures x 2 sites     Status: None (Preliminary result)   Collection Time: 08/19/17  8:26 PM  Result Value Ref Range Status   Specimen Description BLOOD RIGHT HAND  Final   Special Requests   Final    BOTTLES DRAWN AEROBIC AND ANAEROBIC Blood Culture adequate volume   Culture NO GROWTH 4 DAYS  Final   Report Status PENDING  Incomplete  Surgical pcr screen     Status: None   Collection Time: 08/21/17  8:12 AM  Result Value Ref Range Status   MRSA, PCR NEGATIVE NEGATIVE Final   Staphylococcus aureus NEGATIVE NEGATIVE Final    Comment:  (NOTE) The Xpert SA Assay (FDA approved for NASAL specimens in patients 47 years of age and older), is one component of a comprehensive surveillance program. It is not intended to diagnose infection nor to guide or monitor treatment.      Dg Foot Complete Left  Result Date: 08/22/2017 CLINICAL DATA:  Left foot transmetatarsal amputation. EXAM: LEFT FOOT - COMPLETE 3+ VIEW COMPARISON:  11/09/ 2018 . FINDINGS: Transmetatarsal amputation. Plaster splint noted . Plaster splint makes evaluation difficult. Drainage catheter noted. Diffuse degenerative change. IMPRESSION: Postsurgical changes left foot with transmetatarsal amputation. Electronically Signed   By: [name] Fushomas  [name]   On: 08/22/2017 11:14    Medications Scheduled Meds: . enoxaparin (LOVENOX) injection  40 mg Subcutaneous Q24H  . feeding supplement (GLUCERNA SHAKE)  237 mL Oral BID BM  . folic acid  1 mg Oral Daily  . insulin aspart  0-9 Units Subcutaneous TID WC  . insulin aspart  6 Units Subcutaneous TID WC  . insulin glargine  20 Units Subcutaneous Daily  . lipase/protease/amylase  36,000 Units Oral TID WC & HS  . LORazepam  0-4 mg Intravenous Q12H  . multivitamin with minerals  1 tablet Oral Daily  . nutrition supplement (JUVEN)  1 packet Oral BID BM  .  pantoprazole  40 mg Oral Daily  . thiamine  100 mg Oral Daily   Or  . thiamine  100 mg Intravenous Daily   Continuous Infusions: . ceFEPime (MAXIPIME) IV Stopped (08/23/17 0730)  . dextrose 5 % and 0.9 % NaCl with KCl 20 mEq/L 60 mL/hr at 08/23/17 0618  . lactated ringers Stopped (08/22/17 1221)  . metronidazole 500 mg (08/23/17 1254)  . vancomycin Stopped (08/23/17 0716)   PRN Meds:.HYDROcodone-acetaminophen, morphine injection  Assessment 1. POD 1 S/P TMA with TAL for osteomyelitis of the 2nd metatarsal, left foot. 2. Cellulitis, left foot. 3. Non-healing ulceration, left foot. 4. Diabetes mellitus with peripheral neuropathy.  Plan Increase in WBC is  secondary to recent surgery.  I will change dressing 08/24/2017.  If surgical wound is progressing well, I will okay for transfer from my standpoint.  He will need SNF placement.  He will be NWB on LLE for at least 3 weeks.  I recommend a 2 week course of PO abx from time of transfer to SNF.  [name] 08/23/2017, 1:03 PM

## 2017-08-23 NOTE — Addendum Note (Signed)
Addendum  created 08/23/17 0920 by Franco NonesYates, Kyarra Vancamp S, CRNA   Sign clinical note

## 2017-08-23 NOTE — Anesthesia Postprocedure Evaluation (Signed)
Anesthesia Post Note  Patient: [name]  Procedure(s) Performed: TRANSMETATARSAL AMPUTATION (amputation of all remaining toes and a portion of the left foot) LEFT FOOT (Left ) LENGTHENING OF THE LEFT ACHILLES TENDON (Left )  Patient location during evaluation: Nursing Unit Anesthesia Type: MAC Level of consciousness: awake and alert Pain management: satisfactory to patient Vital Signs Assessment: post-procedure vital signs reviewed and stable Respiratory status: spontaneous breathing Cardiovascular status: stable Postop Assessment: no apparent nausea or vomiting Anesthetic complications: no     Last Vitals:  Vitals:   08/22/17 2000 08/23/17 0539  BP: 125/80 121/80  Pulse: 98 90  Resp: 20 20  Temp: (!) 38.3 C 37.3 C  SpO2: 98% 100%    Last Pain:  Vitals:   08/23/17 0539  TempSrc: Oral  PainSc:                  Drucie Opitz

## 2017-08-23 NOTE — Care Management Important Message (Signed)
Important Message  Patient Details  Name: [name] MRN: [id number] Date of Birth: [birth year]   Medicare Important Message Given:  Yes    Malcolm MetroChildress, Jarriel Papillion Demske, RN 08/23/2017, 10:56 AM

## 2017-08-24 DIAGNOSIS — F1011 Alcohol abuse, in remission: Secondary | ICD-10-CM | POA: Diagnosis not present

## 2017-08-24 DIAGNOSIS — Z89412 Acquired absence of left great toe: Secondary | ICD-10-CM | POA: Diagnosis not present

## 2017-08-24 DIAGNOSIS — Z7401 Bed confinement status: Secondary | ICD-10-CM | POA: Diagnosis not present

## 2017-08-24 DIAGNOSIS — M869 Osteomyelitis, unspecified: Secondary | ICD-10-CM | POA: Diagnosis not present

## 2017-08-24 DIAGNOSIS — Z720 Tobacco use: Secondary | ICD-10-CM | POA: Diagnosis not present

## 2017-08-24 DIAGNOSIS — K219 Gastro-esophageal reflux disease without esophagitis: Secondary | ICD-10-CM | POA: Diagnosis not present

## 2017-08-24 DIAGNOSIS — M6281 Muscle weakness (generalized): Secondary | ICD-10-CM | POA: Diagnosis not present

## 2017-08-24 DIAGNOSIS — E1169 Type 2 diabetes mellitus with other specified complication: Secondary | ICD-10-CM | POA: Diagnosis not present

## 2017-08-24 DIAGNOSIS — M86172 Other acute osteomyelitis, left ankle and foot: Secondary | ICD-10-CM | POA: Diagnosis not present

## 2017-08-24 DIAGNOSIS — Z23 Encounter for immunization: Secondary | ICD-10-CM | POA: Diagnosis not present

## 2017-08-24 DIAGNOSIS — E11621 Type 2 diabetes mellitus with foot ulcer: Secondary | ICD-10-CM | POA: Diagnosis not present

## 2017-08-24 DIAGNOSIS — E871 Hypo-osmolality and hyponatremia: Secondary | ICD-10-CM | POA: Diagnosis not present

## 2017-08-24 DIAGNOSIS — Z89421 Acquired absence of other right toe(s): Secondary | ICD-10-CM | POA: Diagnosis not present

## 2017-08-24 DIAGNOSIS — I10 Essential (primary) hypertension: Secondary | ICD-10-CM | POA: Diagnosis not present

## 2017-08-24 DIAGNOSIS — K860 Alcohol-induced chronic pancreatitis: Secondary | ICD-10-CM | POA: Diagnosis not present

## 2017-08-24 DIAGNOSIS — R2681 Unsteadiness on feet: Secondary | ICD-10-CM | POA: Diagnosis not present

## 2017-08-24 DIAGNOSIS — F1010 Alcohol abuse, uncomplicated: Secondary | ICD-10-CM | POA: Diagnosis not present

## 2017-08-24 DIAGNOSIS — E119 Type 2 diabetes mellitus without complications: Secondary | ICD-10-CM | POA: Diagnosis not present

## 2017-08-24 DIAGNOSIS — Z89432 Acquired absence of left foot: Secondary | ICD-10-CM | POA: Diagnosis not present

## 2017-08-24 DIAGNOSIS — R279 Unspecified lack of coordination: Secondary | ICD-10-CM | POA: Diagnosis not present

## 2017-08-24 DIAGNOSIS — Z794 Long term (current) use of insulin: Secondary | ICD-10-CM | POA: Diagnosis not present

## 2017-08-24 DIAGNOSIS — L97509 Non-pressure chronic ulcer of other part of unspecified foot with unspecified severity: Secondary | ICD-10-CM | POA: Diagnosis not present

## 2017-08-24 LAB — GLUCOSE, CAPILLARY
GLUCOSE-CAPILLARY: 291 mg/dL — AB (ref 65–99)
Glucose-Capillary: 186 mg/dL — ABNORMAL HIGH (ref 65–99)
Glucose-Capillary: 220 mg/dL — ABNORMAL HIGH (ref 65–99)
Glucose-Capillary: 257 mg/dL — ABNORMAL HIGH (ref 65–99)
Glucose-Capillary: 197 mg/dL — ABNORMAL HIGH (ref 65–99)

## 2017-08-24 LAB — CBC WITH DIFFERENTIAL/PLATELET
BASOS PCT: 0 %
Basophils Absolute: 0 10*3/uL (ref 0.0–0.1)
EOS PCT: 1 %
Eosinophils Absolute: 0.1 10*3/uL (ref 0.0–0.7)
HEMATOCRIT: 34.7 % — AB (ref 39.0–52.0)
Hemoglobin: 11.5 g/dL — ABNORMAL LOW (ref 13.0–17.0)
LYMPHS PCT: 18 %
Lymphs Abs: 1.9 10*3/uL (ref 0.7–4.0)
MCH: 33.1 pg (ref 26.0–34.0)
MCHC: 33.1 g/dL (ref 30.0–36.0)
MCV: 100 fL (ref 78.0–100.0)
MONO ABS: 2.3 10*3/uL — AB (ref 0.1–1.0)
MONOS PCT: 22 %
NEUTROS ABS: 6.1 10*3/uL (ref 1.7–7.7)
Neutrophils Relative %: 59 %
PLATELETS: 367 10*3/uL (ref 150–400)
RBC: 3.47 MIL/uL — ABNORMAL LOW (ref 4.22–5.81)
RDW: 12 % (ref 11.5–15.5)
WBC: 10.4 10*3/uL (ref 4.0–10.5)

## 2017-08-24 LAB — CULTURE, BLOOD (ROUTINE X 2)
CULTURE: NO GROWTH
CULTURE: NO GROWTH
SPECIAL REQUESTS: ADEQUATE

## 2017-08-24 LAB — BASIC METABOLIC PANEL
Anion gap: 5 (ref 5–15)
BUN: 9 mg/dL (ref 6–20)
CALCIUM: 8.6 mg/dL — AB (ref 8.9–10.3)
CO2: 28 mmol/L (ref 22–32)
CREATININE: 0.64 mg/dL (ref 0.61–1.24)
Chloride: 98 mmol/L — ABNORMAL LOW (ref 101–111)
GFR calc Af Amer: >60 mL/min (ref >60)
GFR calc non Af Amer: >60 mL/min (ref >60)
GLUCOSE: 236 mg/dL — AB (ref 65–99)
Potassium: 3.7 mmol/L (ref 3.5–5.1)
Sodium: 131 mmol/L — ABNORMAL LOW (ref 135–145)

## 2017-08-24 LAB — MAGNESIUM: Magnesium: 1.8 mg/dL (ref 1.7–2.4)

## 2017-08-24 MED ORDER — PANTOPRAZOLE SODIUM 40 MG PO TBEC: 40.0000 mg | DELAYED_RELEASE_TABLET | Freq: Every day | ORAL | Status: DC

## 2017-08-24 MED ORDER — GLUCERNA SHAKE PO LIQD: 237.0000 mL | Freq: Two times a day (BID) | ORAL | 0 refills | Status: DC

## 2017-08-24 MED ORDER — FLORANEX PO PACK: 1.0000 g | PACK | Freq: Three times a day (TID) | ORAL | Status: DC

## 2017-08-24 MED ORDER — AMOXICILLIN-POT CLAVULANATE 875-125 MG PO TABS
1.0000 | ORAL_TABLET | Freq: Two times a day (BID) | ORAL | 0 refills | Status: DC
Start: 2017-08-24 — End: 2017-08-24

## 2017-08-24 MED ORDER — CIPROFLOXACIN HCL 500 MG PO TABS: 500.0000 mg | ORAL_TABLET | Freq: Two times a day (BID) | ORAL | 0 refills | Status: AC

## 2017-08-24 MED ORDER — INSULIN ASPART 100 UNIT/ML ~~LOC~~ SOLN: 6.0000 [IU] | Freq: Three times a day (TID) | SUBCUTANEOUS | 11 refills | Status: DC

## 2017-08-24 MED ORDER — SULFAMETHOXAZOLE-TRIMETHOPRIM 400-80 MG PO TABS: 2.0000 | ORAL_TABLET | Freq: Two times a day (BID) | ORAL | 0 refills | Status: DC

## 2017-08-24 MED ORDER — ADULT MULTIVITAMIN W/MINERALS CH: 1.0000 | ORAL_TABLET | Freq: Every day | ORAL | Status: DC

## 2017-08-24 MED ORDER — FOLIC ACID 1 MG PO TABS: 1.0000 mg | ORAL_TABLET | Freq: Every day | ORAL | Status: DC

## 2017-08-24 MED ORDER — INSULIN GLARGINE 100 UNIT/ML ~~LOC~~ SOLN: 20.0000 [IU] | Freq: Every day | SUBCUTANEOUS | 11 refills | Status: DC

## 2017-08-24 MED ORDER — CLINDAMYCIN HCL 300 MG PO CAPS: 300.0000 mg | ORAL_CAPSULE | Freq: Three times a day (TID) | ORAL | 0 refills | Status: AC

## 2017-08-24 MED ORDER — THIAMINE HCL 100 MG PO TABS: 100.0000 mg | ORAL_TABLET | Freq: Every day | ORAL | Status: DC

## 2017-08-24 MED ORDER — HYDROCODONE-ACETAMINOPHEN 7.5-325 MG PO TABS: 1.0000 | ORAL_TABLET | Freq: Four times a day (QID) | ORAL | 0 refills | Status: AC

## 2017-08-24 NOTE — Progress Notes (Signed)
Podiatry Progress Note  Subjective [name] is POD 2 S/P TMA L foot with TAL.  Pain controlled with meds.  No nausea, vomiting, fever or chills.  No chest pain, shortness of breath or calf pain.  Objective Vital signs in last 24 hours:   Temp:  [98.5 F (36.9 C)-98.7 F (37.1 C)] 98.7 F (37.1 C) (12/08 0356) Pulse Rate:  [89-96] 90 (12/08 0600) Resp:  [16] 16 (12/08 0356) BP: (106-129)/(67-90) 112/82 (12/08 0600) SpO2:  [100 %] 100 % (12/08 0356) Weight:  [152 lb 12.5 oz (69.3 kg)] 152 lb 12.5 oz (69.3 kg) (12/08 0356)  DRESSING:  Intact with focal pink drainage consistent serosanguinous exudte. DRAIN:  <0.5 cc blood. INTEGUMENT:  Incisions are well coapted with suture in place.  No acute signs of infection. MUSCULOSKELETAL:  Calves are soft and nontender.  Lab/Test Results  Recent Labs    08/23/17 0547 08/24/17 0630  WBC 11.5* 10.4  HGB 11.1* 11.5*  HCT 33.1* 34.7*  PLT 357 367  NA 133* 131*  K 3.3* 3.7  CL 103 98*  CO2 25 28  BUN 5* 9  CREATININE 0.52* 0.64  GLUCOSE 178* 236*  CALCIUM 8.5* 8.6*    Recent Results (from the past 240 hour(s))  Blood Cultures x 2 sites     Status: None   Collection Time: 08/19/17  8:22 PM  Result Value Ref Range Status   Specimen Description BLOOD LEFT ARM  Final   Special Requests   Final    BOTTLES DRAWN AEROBIC AND ANAEROBIC Blood Culture results may not be optimal due to an inadequate volume of blood received in culture bottles   Culture NO GROWTH 5 DAYS  Final   Report Status 08/24/2017 FINAL  Final  Blood Cultures x 2 sites     Status: None   Collection Time: 08/19/17  8:26 PM  Result Value Ref Range Status   Specimen Description BLOOD RIGHT HAND  Final   Special Requests   Final    BOTTLES DRAWN AEROBIC AND ANAEROBIC Blood Culture adequate volume   Culture NO GROWTH 5 DAYS  Final   Report Status 08/24/2017 FINAL  Final  Surgical pcr screen     Status: None   Collection Time: 08/21/17  8:12 AM  Result Value Ref  Range Status   MRSA, PCR NEGATIVE NEGATIVE Final   Staphylococcus aureus NEGATIVE NEGATIVE Final    Comment: (NOTE) The Xpert SA Assay (FDA approved for NASAL specimens in patients 822 years of age and older), is one component of a comprehensive surveillance program. It is not intended to diagnose infection nor to guide or monitor treatment.      No results found.  Medications Scheduled Meds: . enoxaparin (LOVENOX) injection  40 mg Subcutaneous Q24H  . feeding supplement (GLUCERNA SHAKE)  237 mL Oral BID BM  . folic acid  1 mg Oral Daily  . insulin aspart  0-9 Units Subcutaneous TID WC  . insulin aspart  6 Units Subcutaneous TID WC  . insulin glargine  20 Units Subcutaneous Daily  . lipase/protease/amylase  36,000 Units Oral TID WC & HS  . multivitamin with minerals  1 tablet Oral Daily  . nutrition supplement (JUVEN)  1 packet Oral BID BM  . pantoprazole  40 mg Oral Daily  . sodium chloride flush  3 mL Intravenous Q12H  . thiamine  100 mg Oral Daily   Or  . thiamine  100 mg Intravenous Daily   Continuous Infusions: . sodium  chloride 250 mL (08/23/17 1616)  . ceFEPime (MAXIPIME) IV Stopped (08/24/17 0535)  . lactated ringers Stopped (08/22/17 1221)  . metronidazole 500 mg (08/24/17 1210)  . vancomycin Stopped (08/24/17 0706)   PRN Meds:.sodium chloride, HYDROcodone-acetaminophen, morphine injection, sodium chloride flush  Assessment 1. POD 2 S/P TMA with TAL for osteomyelitis of the 2nd metatarsal, left foot. 2. Cellulitis, left foot. 3. Non-healing ulceration, left foot. 4. Diabetes mellitus with peripheral neuropathy.  Plan Dressing changed.  TLS drain removed.  Okay for transfer to SNF from my standpoint.  NWB on LLE.  Cindamycin 300 mg TID + ciprofloxacin 500 mg BID x 10 days from time of transfer to SNF.  Follow-up with me at my [place] office on 08/30/1017 at 2:00 PM.  [name] 08/24/2017, 12:37 PM

## 2017-08-24 NOTE — Progress Notes (Signed)
Physical Therapy Treatment Patient Details Name: [name] MedalJerel L Markos MRN: [id number] DOB: 07/16/1971 Today's Date: 08/24/2017    History of Present Illness 46 yo male with onset of infection on L foot created need for transmet amputation, from diabetic ulcer and osteomyelitis.  PMHx:  pancreatitis, EtOH abuse, DM, GERD, HTN, prev L foot amp of first toe and R fifth toe,     PT Comments    Pt was seen for evaluation of mobility to day and talked with him about positioning L foot as he declined exercises.  He is able to walk but is too impulsive at times, tends to start off at a faster pace then fatigued.  Talked with him about pacing activity.  He understands the use of a support of a pillow or the foam eggcrate pieces to position and elevate LLE, but is sitting at end of session with feet down side of bed.  Talked with him about managing comfort but he is not invested in the explanation from PT.  Continue to educate pt on the importance of his therapy.   Follow Up Recommendations  SNF     Equipment Recommendations  None recommended by PT    Recommendations for Other Services       Precautions / Restrictions Precautions Precautions: Fall Restrictions Weight Bearing Restrictions: Yes LLE Weight Bearing: Non weight bearing    Mobility  Bed Mobility Overal bed mobility: Needs Assistance Bed Mobility: Supine to Sit;Sit to Supine     Supine to sit: Supervision Sit to supine: Supervision      Transfers Overall transfer level: Needs assistance Equipment used: Rolling walker (2 wheeled) Transfers: Sit to/from Stand Sit to Stand: Supervision         General transfer comment: able to stand with PT near  Ambulation/Gait Ambulation/Gait assistance: Min guard Ambulation Distance (Feet): 80 Feet Assistive device: Rolling walker (2 wheeled);1 person hand held assist   Gait velocity: reduced Gait velocity interpretation: Below normal speed for age/gender General Gait Details:  hopping RLE with care not to overdo the dependent position on LLE   Stairs            Wheelchair Mobility    Modified Rankin (Stroke Patients Only)       Balance Overall balance assessment: Needs assistance Sitting-balance support: Feet supported Sitting balance-Leahy Scale: Good     Standing balance support: Bilateral upper extremity supported Standing balance-Leahy Scale: Poor                              Cognition Arousal/Alertness: Awake/alert Behavior During Therapy: WFL for tasks assessed/performed Overall Cognitive Status: Within Functional Limits for tasks assessed                                 General Comments: pt self limiting tx       Exercises      General Comments General comments (skin integrity, edema, etc.): splint removed from L foot now, just a clean bandage in place with ace wrap over      Pertinent Vitals/Pain Pain Assessment: 0-10 Pain Score: 7  Pain Location: L foot Pain Descriptors / Indicators: Operative site guarding Pain Intervention(s): Monitored during session;Repositioned    Home Living                      Prior Function  PT Goals (current goals can now be found in the care plan section) Acute Rehab PT Goals Patient Stated Goal: to get stronger and get home Progress towards PT goals: Progressing toward goals    Frequency    Min 3X/week      PT Plan Current plan remains appropriate    Co-evaluation              AM-PAC PT "6 Clicks" Daily Activity  Outcome Measure  Difficulty turning over in bed (including adjusting bedclothes, sheets and blankets)?: None Difficulty moving from lying on back to sitting on the side of the bed? : A Little Difficulty sitting down on and standing up from a chair with arms (e.g., wheelchair, bedside commode, etc,.)?: A Little Help needed moving to and from a bed to chair (including a wheelchair)?: A Little Help needed walking in  hospital room?: A Little Help needed climbing 3-5 steps with a railing? : A Little 6 Click Score: 19    End of Session Equipment Utilized During Treatment: Gait belt Activity Tolerance: Patient tolerated treatment well;Patient limited by fatigue Patient left: in bed;with call bell/phone within reach Nurse Communication: Mobility status PT Visit Diagnosis: Unsteadiness on feet (R26.81);Muscle weakness (generalized) (M62.81);Difficulty in walking, not elsewhere classified (R26.2);Pain Pain - Right/Left: Left Pain - part of body: Ankle and joints of foot     Time: 1415-1439 PT Time Calculation (min) (ACUTE ONLY): 24 min  Charges:  $Gait Training: 8-22 mins $Therapeutic Activity: 8-22 mins                    G Codes:  Functional Assessment Tool Used: AM-PAC 6 Clicks Basic Mobility     Ivar DrapeRuth E Winfield Caba 08/24/2017, 2:55 PM   Samul Dadauth Mannix Kroeker, PT MS Acute Rehab Dept. Number: [facility] [phone] and [facility] [phone]

## 2017-08-24 NOTE — Progress Notes (Signed)
Patient was discharged at 2320 on 08/24/2017 by EMS [facility] [place]. Patient was in stable condition, telementry and IV were removed by previous nurse.  All questions and concerns the patient had were answered.

## 2017-08-24 NOTE — Progress Notes (Signed)
Patient is to be [facility] [place] in stable condition. Patient's IV and telemetry removed, WNL. Patient made aware of discharge and is agreeable. Report called to RN at facility.  Quita SkyeMorgan P Dishmon, RN

## 2017-08-24 NOTE — Discharge Summary (Addendum)
Physician Discharge Summary  [name] Carr YQM:[id number] DOB: 08-18-1971 DOA: 08/19/2017  [facility] Surgeon: Dr. Currie ParisBen McKinney Endocrinologist: Dr. Fransico HimNida  Admit date: 08/19/2017 Discharge date: 08/24/2017  Admitted From: Home  Disposition: SNF  Recommendations for Outpatient Follow-up:  1. Follow up with podiatrist on 08/30/17 at 2:00 pm as scheduled 2. Follow up with endocrinologist next month as scheduled 3. Follow up with PCP in 2 weeks 4. Monitor blood sugars 4 times per day.  5. Adjust insulin doses as needed for better blood sugar control. 6. Please obtain BMP/CBC in one week 7. Please continue antibiotics for 10 days then stop.   8. Please consult wound care nurse for dressing changes and wound care.  9. Nonweight bearing on LLE for at least 3 weeks.  10. Please follow up on the following pending results: final culture data   Discharge Condition: STABLE   CODE STATUS: FULL    Brief Hospitalization Summary: Please see all hospital notes, images, labs for full details of the hospitalization.  HPI: [name] Backer is a 46 y.o. male with medical history significant of alcohol abuse.  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  He was seen by podiatry, Dr. Logan BoresEvans on Friday, 08/16/2017 who recommended for him to come to the emergency department for IV antibiotic therapy.  However, the patient declined to come to the hospital and was given a prescription for ciprofloxacin 500 mg p.o. twice daily for 2 weeks and Bactrim DS p.o. twice daily for 2 weeks.  Unfortunately, over the weekend the foot infection has become worse and he came to the emergency department.  He denies fever, chills, sore throat, dyspnea, chest pain, dizziness, palpitations,  diaphoresis, pitting edema of the lower extremities, PND orthopnea.  Denies abdominal pain, nausea, emesis, diarrhea, constipation, melena or hematochezia.  No dysuria or hematuria.  He denies blurred vision, polyuria or polydipsia.  ED Course: Initial vital signs temperature 36.9C, pulse 111, respirations 20 and blood pressure 147/85 mmHg.  His O2 sat was 98%.  He was given vancomycin cefepime and Flagyl in the emergency department along with normal saline bolus fluids.  His workup shows WBC of 17.3 with 70% neutrophils, hemoglobin 13.1 g/dL and platelets 528339.  His lactic acid was 1.2 mmol/[name].  His BMP shows a sodium of 127, potassium 3.8, chloride 95, CO2 of 15 and anion gap of 17 mmol/[name].  His blood glucose was 272.    MR foot left without contrast IMPRESSION: Severe patient motion degrading image quality limiting evaluation.  1. Soft tissue ulcer involving the second metatarsal head extending to the cortex with cortical destruction and marrow signal abnormality concerning for osteomyelitis, but it is difficult to be definitive given the degree of patient motion.  Brief Narrative:  46 year old male with a history of diabetes, presents with left foot wound infection.  Patient reports having chronic blister on the plantar aspect of his left foot for 3-4 months now.  This is been nonhealing.  He has taken antibiotics for this as an outpatient.  MRI imaging indicates possible underlying osteomyelitis.  He is on IV antibiotics.  Podiatry following for possible surgical management.  Assessment & Plan:   Principal Problem:   Diabetic foot ulcer with osteomyelitis (HCC) Active Problems:   Alcohol abuse   Tobacco  use   GERD   Alcohol-induced chronic pancreatitis (HCC)   Essential hypertension, benign   Hyponatremia   Type 2 diabetes mellitus with foot ulcer (HCC)  1. Diabetic foot ulcer on left foot with osteomyelitis.  Treated with intravenous antibiotics in hospital.  ABIs were found  to be 0.9 bilaterally.  He is s/p transmetatarsal amputation.  He tolerated surgery well.   Podiatry followed in hospital.  He will need to continue the oral antibiotics for 10 days per surgery.      2. 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  Monitor blood sugars 4 times per day and adjust therapy outpatient.  He has an appt with endocrinologist Dr. Fransico HimNida on 10/14/17 for further outpatient follow up.    3. Hypoglycemia - resolved now.  discontinued 70/30 insulin.  4. Hypokalemia - replaced orally.  Magnesium was 2.1.   5. Chronic pancreatitis related to alcohol abuse.  Continue on Creon capsules.  No complaints of abdominal pain at this time. 6. Alcohol abuse.  Currently on CIWA protocol with Ativan.  No signs of withdrawal. 7. Hyponatremia.  Suspect related to hypovolemia.  Improving with IV fluids.  Continue current treatments. 8. GERD.  Continue on PPI  DVT prophylaxis: Lovenox Code Status: Full code Family Communication: No family present Disposition Plan: SNF   Consultants:   Podiatry  Procedures:   12/6 - transmetatarsal amputation  Antimicrobials:   Vancomycin 12/3 >  Flagyl 12/3 >  Cefepime 12/3 >  Discharge Diagnoses:  Principal Problem:   Diabetic foot ulcer with osteomyelitis (HCC) Active Problems:   Alcohol abuse   Tobacco use   GERD   Alcohol-induced chronic pancreatitis (HCC)   Essential hypertension, benign   Hyponatremia   Type 2 diabetes mellitus with foot ulcer (HCC)  Discharge Instructions:  Allergies as of 08/24/2017   No Known Allergies     Medication List    STOP taking these medications   gentamicin cream 0.1 % Commonly known as:  GARAMYCIN   insulin aspart protamine - aspart (70-30) 100 UNIT/ML FlexPen Commonly known as:  NOVOLOG MIX 70/30 FLEXPEN   sulfamethoxazole-trimethoprim 400-80 MG tablet Commonly known as:  BACTRIM     TAKE these medications   acetaminophen 500 MG tablet Commonly known  as:  TYLENOL Take 1 tablet (500 mg total) by mouth every 6 (six) hours as needed.   ciprofloxacin 500 MG tablet Commonly known as:  CIPRO Take 1 tablet (500 mg total) by mouth 2 (two) times daily for 10 days.   clindamycin 300 MG capsule Commonly known as:  CLEOCIN Take 1 capsule (300 mg total) by mouth 3 (three) times daily for 10 days.   feeding supplement (GLUCERNA SHAKE) Liqd Take 237 mLs by mouth 2 (two) times daily between meals.   folic acid 1 MG tablet Commonly known as:  FOLVITE Take 1 tablet (1 mg total) by mouth daily.   HYDROcodone-acetaminophen 7.5-325 MG tablet Commonly known as:  NORCO Take 1 tablet by mouth every 6 (six) hours for 5 days.   insulin aspart 100 UNIT/ML injection Commonly known as:  novoLOG Inject 6 Units into the skin 3 (three) times daily with meals.   insulin glargine 100 UNIT/ML injection Commonly known as:  LANTUS Inject 0.2 mLs (20 Units total) into the skin daily.   lactobacillus Pack Take 1 packet (1 g total) by mouth 3 (three) times daily with meals.   lipase/protease/amylase [id number] UNITS Cpep capsule Commonly known as:  CREON TAKE  1 CAPSULE WITH MEALS AND SNACKS UPTO 5 TIMES A DAY What changed:    how much to take  how to take this  when to take this  additional instructions   multivitamin with minerals Tabs tablet Take 1 tablet by mouth daily.   pantoprazole 40 MG tablet Commonly known as:  PROTONIX Take 1 tablet (40 mg total) by mouth daily.   thiamine 100 MG tablet Take 1 tablet (100 mg total) by mouth daily.      Follow-up Information    [facility] Schedule an appointment as soon as possible for a visit in 2 week(s).   Contact information: [address] [place] [address] [phone]        Ferman Hamming, DPM. Schedule an appointment as soon as possible for a visit on 08/30/2017.   Specialty:  Podiatry Why:  Hospital Follow Up at 2:00 pm Contact information: [address] [place] [address] [phone]          No Known Allergies Allergies as of 08/24/2017   No Known Allergies     Medication List    STOP taking these medications   gentamicin cream 0.1 % Commonly known as:  GARAMYCIN   insulin aspart protamine - aspart (70-30) 100 UNIT/ML FlexPen Commonly known as:  NOVOLOG MIX 70/30 FLEXPEN   sulfamethoxazole-trimethoprim 400-80 MG tablet Commonly known as:  BACTRIM     TAKE these medications   acetaminophen 500 MG tablet Commonly known as:  TYLENOL Take 1 tablet (500 mg total) by mouth every 6 (six) hours as needed.   ciprofloxacin 500 MG tablet Commonly known as:  CIPRO Take 1 tablet (500 mg total) by mouth 2 (two) times daily for 10 days.   clindamycin 300 MG capsule Commonly known as:  CLEOCIN Take 1 capsule (300 mg total) by mouth 3 (three) times daily for 10 days.   feeding supplement (GLUCERNA SHAKE) Liqd Take 237 mLs by mouth 2 (two) times daily between meals.   folic acid 1 MG tablet Commonly known as:  FOLVITE Take 1 tablet (1 mg total) by mouth daily.   HYDROcodone-acetaminophen 7.5-325 MG tablet Commonly known as:  NORCO Take 1 tablet by mouth every 6 (six) hours for 5 days.   insulin aspart 100 UNIT/ML injection Commonly known as:  novoLOG Inject 6 Units into the skin 3 (three) times daily with meals.   insulin glargine 100 UNIT/ML injection Commonly known as:  LANTUS Inject 0.2 mLs (20 Units total) into the skin daily.   lactobacillus Pack Take 1 packet (1 g total) by mouth 3 (three) times daily with meals.   lipase/protease/amylase 52841 UNITS Cpep capsule Commonly known as:  CREON TAKE 1 CAPSULE WITH MEALS AND SNACKS UPTO 5 TIMES A DAY What changed:    how much to take  how to take this  when to take this  additional instructions   multivitamin with minerals Tabs tablet Take 1 tablet by mouth daily.   pantoprazole 40 MG tablet Commonly known as:  PROTONIX Take 1 tablet (40 mg  total) by mouth daily.   thiamine 100 MG tablet Take 1 tablet (100 mg total) by mouth daily.       Procedures/Studies: Mr Foot Left Wo Contrast  Result Date: 08/19/2017 CLINICAL DATA:  Chronic open wounds on the left foot. EXAM: MRI OF THE LEFT FOOT WITHOUT CONTRAST TECHNIQUE: Multiplanar, multisequence MR imaging of the left foot was performed. No intravenous contrast was administered. COMPARISON:  None.  FINDINGS: Severe patient motion degrading image quality limiting evaluation. Bones/Joint/Cartilage Transverse metatarsal amputation. Soft tissue ulcer involving the second metatarsal head extending to the cortex with cortical destruction and marrow signal abnormality concerning for osteomyelitis, but it is difficult to be definitive given the degree of patient motion. No fracture or dislocation. Normal alignment. Ligaments Ligaments are suboptimally evaluated by CT. Muscles and Tendons No muscle atrophy. Increased T2 signal within the plantar musculature likely neurogenic. Soft tissue No fluid collection or hematoma.  No soft tissue mass. IMPRESSION: Severe patient motion degrading image quality limiting evaluation. 1. Soft tissue ulcer involving the second metatarsal head extending to the cortex with cortical destruction and marrow signal abnormality concerning for osteomyelitis, but it is difficult to be definitive given the degree of patient motion. Electronically Signed   By: Elige Ko   On: 08/19/2017 20:33   US Arterial Abi (screening Lower Extremity)  Result Date: 08/20/2017 CLINICAL DATA:  Diabetic foot ulceration, osteomyelitis, status post left great toe amputation EXAM: NONINVASIVE PHYSIOLOGIC VASCULAR STUDY OF BILATERAL LOWER EXTREMITIES TECHNIQUE: Evaluation of both lower extremities were performed at rest, including calculation of ankle-brachial indices with single level Doppler, pressure and pulse volume recording. COMPARISON:  None. FINDINGS: Right ABI:  0.96 Left ABI:  0.96 Right  Lower Extremity:  Normal arterial waveforms at the ankle. Left Lower Extremity: Biphasic left posterior tibial waveform and flow. Left dorsalis pedis artery is not successful because of the healing wound. 0.9-0.99 Borderline PAD IMPRESSION: Resting ABIs as above, within the range of borderline peripheral artery disease. Electronically Signed   By: Judie Petit.  Shick M.D.   On: 08/20/2017 13:33   Dg Foot Complete Left  Result Date: 08/22/2017 CLINICAL DATA:  Left foot transmetatarsal amputation. EXAM: LEFT FOOT - COMPLETE 3+ VIEW COMPARISON:  11/09/ 2018 . FINDINGS: Transmetatarsal amputation. Plaster splint noted . Plaster splint makes evaluation difficult. Drainage catheter noted. Diffuse degenerative change. IMPRESSION: Postsurgical changes left foot with transmetatarsal amputation. Electronically Signed   By: Maisie Fus  Register   On: 08/22/2017 11:14   Dg Foot Complete Left  Result Date: 07/26/2017 Please see detailed radiograph report in office note.    Subjective: Pt without complaints. Pain in foot controlled.    Discharge Exam: Vitals:   08/24/17 0600 08/24/17 1400  BP: 112/82 129/78  Pulse: 90 86  Resp:  18  Temp:  98.3 F (36.8 C)  SpO2:  99%   Vitals:   08/23/17 2333 08/24/17 0356 08/24/17 0600 08/24/17 1400  BP: 124/90 106/67 112/82 129/78  Pulse: 94 89 90 86  Resp:  16  18  Temp:  98.7 F (37.1 C)  98.3 F (36.8 C)  TempSrc:  Oral  Oral  SpO2:  100%  99%  Weight:  69.3 kg (152 lb 12.5 oz)    Height:       General: Pt is alert, awake, not in acute distress Cardiovascular: RRR, S1/S2 +, no rubs, no gallops Respiratory: CTA bilaterally, no wheezing, no rhonchi Abdominal: Soft, NT, ND, bowel sounds + Extremities: wound clean, dry, intact   The results of significant diagnostics from this hospitalization (including imaging, microbiology, ancillary and laboratory) are listed below for reference.     Microbiology: Recent Results (from the past 240 hour(s))  Blood Cultures x  2 sites     Status: None   Collection Time: 08/19/17  8:22 PM  Result Value Ref Range Status   Specimen Description BLOOD LEFT ARM  Final   Special Requests   Final  BOTTLES DRAWN AEROBIC AND ANAEROBIC Blood Culture results may not be optimal due to an inadequate volume of blood received in culture bottles   Culture NO GROWTH 5 DAYS  Final   Report Status 08/24/2017 FINAL  Final  Blood Cultures x 2 sites     Status: None   Collection Time: 08/19/17  8:26 PM  Result Value Ref Range Status   Specimen Description BLOOD RIGHT HAND  Final   Special Requests   Final    BOTTLES DRAWN AEROBIC AND ANAEROBIC Blood Culture adequate volume   Culture NO GROWTH 5 DAYS  Final   Report Status 08/24/2017 FINAL  Final  Surgical pcr screen     Status: None   Collection Time: 08/21/17  8:12 AM  Result Value Ref Range Status   MRSA, PCR NEGATIVE NEGATIVE Final   Staphylococcus aureus NEGATIVE NEGATIVE Final    Comment: (NOTE) The Xpert SA Assay (FDA approved for NASAL specimens in patients 60 years of age and older), is one component of a comprehensive surveillance program. It is not intended to diagnose infection nor to guide or monitor treatment.      Labs: BNP (last 3 results) No results for input(s): BNP in the last 8760 hours. Basic Metabolic Panel: Recent Labs  Lab 08/19/17 1905  08/21/17 0508 08/21/17 1911 08/22/17 0510 08/23/17 0547 08/24/17 0630  NA 127*   < > 134* 132* 134* 133* 131*  K 3.8   < > 2.5* 3.8 3.7 3.3* 3.7  CL 95*   < > 104 106 104 103 98*  CO2 15*   < > 22 20* 23 25 28   GLUCOSE 272*   < > 46* 180* 242* 178* 236*  BUN 9   < > 7 10 6  5* 9  CREATININE 1.01   < > 0.64 0.70 0.65 0.52* 0.64  CALCIUM 9.5   < > 9.2 8.4* 9.1 8.5* 8.6*  MG 1.9  --  2.1  --   --   --  1.8   < > = values in this interval not displayed.   Liver Function Tests: No results for input(s): AST, ALT, ALKPHOS, BILITOT, PROT, ALBUMIN in the last 168 hours. No results for input(s): LIPASE, AMYLASE  in the last 168 hours. No results for input(s): AMMONIA in the last 168 hours. CBC: Recent Labs  Lab 08/20/17 0446 08/21/17 0508 08/22/17 0510 08/23/17 0547 08/24/17 0630  WBC 13.3* 13.0* 8.4 11.5* 10.4  NEUTROABS 8.7* 8.3* 4.2 6.7 6.1  HGB 12.0* 12.8* 12.8* 11.1* 11.5*  HCT 35.8* 37.8* 38.4* 33.1* 34.7*  MCV 99.4 97.9 98.5 98.8 100.0  PLT 307 353 337 357 367   Cardiac Enzymes: No results for input(s): CKTOTAL, CKMB, CKMBINDEX, TROPONINI in the last 168 hours. BNP: Invalid input(s): POCBNP CBG: Recent Labs  Lab 08/23/17 1610 08/23/17 2135 08/24/17 0354 08/24/17 0805 08/24/17 1140  GLUCAP 160* 267* 186* 220* 291*   D-Dimer No results for input(s): DDIMER in the last 72 hours. Hgb A1c No results for input(s): HGBA1C in the last 72 hours. Lipid Profile No results for input(s): CHOL, HDL, LDLCALC, TRIG, CHOLHDL, LDLDIRECT in the last 72 hours. Thyroid function studies No results for input(s): TSH, T4TOTAL, T3FREE, THYROIDAB in the last 72 hours.  Invalid input(s): FREET3 Anemia work up No results for input(s): VITAMINB12, FOLATE, FERRITIN, TIBC, IRON, RETICCTPCT in the last 72 hours. Urinalysis    Component Value Date/Time   COLORURINE YELLOW 02/15/2017 1958   APPEARANCEUR CLEAR 02/15/2017 1958   LABSPEC  1.027 02/15/2017 1958   PHURINE 6.0 02/15/2017 1958   GLUCOSEU >=500 (A) 02/15/2017 1958   HGBUR SMALL (A) 02/15/2017 1958   BILIRUBINUR NEGATIVE 02/15/2017 1958   KETONESUR 5 (A) 02/15/2017 1958   PROTEINUR 100 (A) 02/15/2017 1958   UROBILINOGEN 0.2 02/15/2013 1915   NITRITE NEGATIVE 02/15/2017 1958   LEUKOCYTESUR NEGATIVE 02/15/2017 1958   Sepsis Labs Invalid input(s): PROCALCITONIN,  WBC,  LACTICIDVEN Microbiology Recent Results (from the past 240 hour(s))  Blood Cultures x 2 sites     Status: None   Collection Time: 08/19/17  8:22 PM  Result Value Ref Range Status   Specimen Description BLOOD LEFT ARM  Final   Special Requests   Final    BOTTLES  DRAWN AEROBIC AND ANAEROBIC Blood Culture results may not be optimal due to an inadequate volume of blood received in culture bottles   Culture NO GROWTH 5 DAYS  Final   Report Status 08/24/2017 FINAL  Final  Blood Cultures x 2 sites     Status: None   Collection Time: 08/19/17  8:26 PM  Result Value Ref Range Status   Specimen Description BLOOD RIGHT HAND  Final   Special Requests   Final    BOTTLES DRAWN AEROBIC AND ANAEROBIC Blood Culture adequate volume   Culture NO GROWTH 5 DAYS  Final   Report Status 08/24/2017 FINAL  Final  Surgical pcr screen     Status: None   Collection Time: 08/21/17  8:12 AM  Result Value Ref Range Status   MRSA, PCR NEGATIVE NEGATIVE Final   Staphylococcus aureus NEGATIVE NEGATIVE Final    Comment: (NOTE) The Xpert SA Assay (FDA approved for NASAL specimens in patients 73 years of age and older), is one component of a comprehensive surveillance program. It is not intended to diagnose infection nor to guide or monitor treatment.    Time coordinating discharge: 38 mins  SIGNED:  Standley Dakins, MD  [facility] 08/24/2017, 2:39 PM Pager [phone]  If 7PM-7AM, please contact night-coverage [email or web address] Password [email or web address]

## 2017-08-24 NOTE — [place] FL2 (Signed)
[place] MEDICAID FL2 LEVEL OF CARE SCREENING TOOL     IDENTIFICATION  Patient Name: [name] Birthdate: Nov 12, 1970 Sex: male Admission Date (Current Location): 08/19/2017  [facility] [place] [place]   Facility and Address:  [facility],  [address], [place] [id number]      Provider Number: [id number]  Attending Physician Name and Address:  Cleora FleetJohnson, Clanford L, MD  Relative Name and Phone Number:       Current Level of Care: Hospital Recommended Level of Care: Skilled Nursing Facility Prior Approval Number:    Date Approved/Denied:   PASRR Number: [id number]  Discharge Plan: SNF    Current Diagnoses: Patient Active Problem List   Diagnosis Date Noted  . Diabetic foot ulcer with osteomyelitis (HCC) 08/19/2017  . Hyponatremia 08/19/2017  . 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 Acute osteomyelitis of left foot (HCC) 02/16/2017  . Protein-calorie malnutrition, severe 02/16/2017  . Subacute osteomyelitis of left foot (HCC)   . Diabetic foot (HCC) 11/28/2016  . Gangrene (HCC) 11/28/2016  . Uncontrolled type 1 diabetes mellitus with complication (HCC) 07/14/2015  . Essential hypertension, benign 07/14/2015  . Exocrine pancreatic insufficiency (HCC) 03/31/2013  . Alcohol-induced chronic pancreatitis (HCC) 02/16/2013  . Elevated LFTs 02/16/2013  . Pancreatic mass 02/16/2013  . Hypokalemia 02/16/2013  . GERD 10/31/2009  . IDDM (insulin dependent diabetes mellitus) (HCC) 08/30/2009  . Alcohol abuse 08/30/2009  . Tobacco use 08/30/2009  . Cannabis abuse 08/30/2009  . PANCREATITIS 08/30/2009    Orientation RESPIRATION BLADDER Height & Weight     Self, Time, Situation  Normal Continent Weight: 152 lb 12.5 oz (69.3 kg) Height:  6\' 1"  (185.4 cm)  BEHAVIORAL SYMPTOMS/MOOD NEUROLOGICAL BOWEL NUTRITION STATUS    (NA) Continent (NA)  AMBULATORY  STATUS COMMUNICATION OF NEEDS Skin   Limited Assist Verbally Normal                       Personal Care Assistance Level of Assistance  Bathing, Dressing Bathing Assistance: Limited assistance Feeding assistance: Independent Dressing Assistance: Limited assistance     Functional Limitations Info  (NA) Sight Info: Adequate Hearing Info: Adequate Speech Info: Adequate    SPECIAL CARE FACTORS FREQUENCY  PT (By licensed PT)     PT Frequency: 3 times a week              Contractures Contractures Info: Not present    Additional Factors Info  Code Status Code Status Info: Full             Current Medications (08/24/2017):  This is the current hospital active medication list Current Facility-Administered Medications  Medication Dose Route Frequency Provider Last Rate Last Dose  . 0.9 %  sodium chloride infusion  250 mL Intravenous PRN Johnson, Clanford L, MD 0 mL/hr at 08/23/17 1616 250 mL at 08/23/17 1616  . ceFEPIme (MAXIPIME) 2 g in dextrose 5 % 50 mL IVPB  2 g Intravenous Q8H Johnson, Clanford L, MD   Stopped at 08/24/17 0535  . enoxaparin (LOVENOX) injection 40 mg  40 mg Subcutaneous Q24H Ferman HammingMcKinney, Benjamin, DPM   40 mg at 08/24/17 1021  . feeding supplement (GLUCERNA SHAKE) (GLUCERNA SHAKE) liquid 237 mL  237 mL Oral BID BM Johnson, Clanford L, MD   237 mL at 08/24/17 1516  . folic acid (FOLVITE) tablet 1 mg  1 mg  Oral Daily Bobette Mortiz, David Manuel, MD   1 mg at 08/24/17 1021  . HYDROcodone-acetaminophen (NORCO) 7.5-325 MG per tablet 1 tablet  1 tablet Oral Q6H PRN Bobette Mortiz, David Manuel, MD   1 tablet at 08/24/17 1227  . insulin aspart (novoLOG) injection 0-9 Units  0-9 Units Subcutaneous TID WC Laural BenesJohnson, Clanford L, MD   5 Units at 08/24/17 1209  . insulin aspart (novoLOG) injection 6 Units  6 Units Subcutaneous TID WC Johnson, Clanford L, MD   6 Units at 08/24/17 1210  . insulin glargine (LANTUS) injection 20 Units  20 Units Subcutaneous Daily Johnson, Clanford L, MD    20 Units at 08/24/17 1021  . lactated ringers infusion   Intravenous Continuous Carlyle BasquesSchultz, John R, MD   Stopped at 08/22/17 1221  . lipase/protease/amylase (CREON) capsule 36,000 Units  36,000 Units Oral TID WC & HS Bobette Mortiz, David Manuel, MD   36,000 Units at 08/24/17 1210  . metroNIDAZOLE (FLAGYL) IVPB 500 mg  500 mg Intravenous Q8H Samuel JesterMcManus, Kathleen, DO   Stopped at 08/24/17 1310  . morphine 2 MG/ML injection 1 mg  1 mg Intravenous Q4H PRN Eduard ClosKakrakandy, Arshad N, MD   1 mg at 08/24/17 [id number]  . multivitamin with minerals tablet 1 tablet  1 tablet Oral Daily Bobette Mortiz, David Manuel, MD   1 tablet at 08/24/17 1021  . nutrition supplement (JUVEN) (JUVEN) powder packet 1 packet  1 packet Oral BID BM Erick BlinksMemon, Jehanzeb, MD   1 packet at 08/24/17 1516  . pantoprazole (PROTONIX) EC tablet 40 mg  40 mg Oral Daily Bobette Mortiz, David Manuel, MD   40 mg at 08/24/17 1021  . sodium chloride flush (NS) 0.9 % injection 3 mL  3 mL Intravenous Q12H Johnson, Clanford L, MD   3 mL at 08/24/17 0830  . sodium chloride flush (NS) 0.9 % injection 3 mL  3 mL Intravenous PRN Johnson, Clanford L, MD      . thiamine (VITAMIN B-1) tablet 100 mg  100 mg Oral Daily Bobette Mortiz, David Manuel, MD   100 mg at 08/24/17 1021   Or  . thiamine (B-1) injection 100 mg  100 mg Intravenous Daily Bobette Mortiz, David Manuel, MD      . vancomycin [facility](VANCOCIN) IVPB 1000 mg/200 mL premix  1,000 mg Intravenous Q8H Samuel JesterMcManus, Kathleen, DO   Stopped at 08/24/17 1617     Discharge Medications: Please see discharge summary for a list of discharge medications.  Relevant Imaging Results:  Relevant Lab Results:   Additional Information SSN [id number]   Charmayne Odell W Nell[place] AFBOliver, [place]

## 2017-08-24 NOTE — Progress Notes (Signed)
Patient will Discharge To: [facility] Anticipated DC Date:08-24-17 Family Notified:yes [name] [phone]) [facility]: [facility]   Per MD patient ready for DC to [facility]  . RN, patient, patient's family, and facility notified of DC. Assessment, Fl2/Pasrr, and Discharge Summary sent to facility. RN given number for report [phone]). DC packet on chart. Ambulance transport requested for patient.   CSW signing off.  Budd Palmerara Amillia Biffle LCSWA [phone]

## 2017-08-27 DIAGNOSIS — K860 Alcohol-induced chronic pancreatitis: Secondary | ICD-10-CM | POA: Diagnosis not present

## 2017-08-27 DIAGNOSIS — M86172 Other acute osteomyelitis, left ankle and foot: Secondary | ICD-10-CM | POA: Diagnosis not present

## 2017-08-27 DIAGNOSIS — I10 Essential (primary) hypertension: Secondary | ICD-10-CM | POA: Diagnosis not present

## 2017-08-27 DIAGNOSIS — K219 Gastro-esophageal reflux disease without esophagitis: Secondary | ICD-10-CM | POA: Diagnosis not present

## 2017-08-28 DIAGNOSIS — K219 Gastro-esophageal reflux disease without esophagitis: Secondary | ICD-10-CM | POA: Diagnosis not present

## 2017-08-28 DIAGNOSIS — E119 Type 2 diabetes mellitus without complications: Secondary | ICD-10-CM | POA: Diagnosis not present

## 2017-08-28 DIAGNOSIS — Z89432 Acquired absence of left foot: Secondary | ICD-10-CM | POA: Diagnosis not present

## 2017-08-28 DIAGNOSIS — I10 Essential (primary) hypertension: Secondary | ICD-10-CM | POA: Diagnosis not present

## 2017-09-04 DIAGNOSIS — I10 Essential (primary) hypertension: Secondary | ICD-10-CM | POA: Diagnosis not present

## 2017-09-04 DIAGNOSIS — Z89432 Acquired absence of left foot: Secondary | ICD-10-CM | POA: Diagnosis not present

## 2017-09-04 DIAGNOSIS — M86172 Other acute osteomyelitis, left ankle and foot: Secondary | ICD-10-CM | POA: Diagnosis not present

## 2017-09-04 DIAGNOSIS — E119 Type 2 diabetes mellitus without complications: Secondary | ICD-10-CM | POA: Diagnosis not present

## 2017-09-07 DIAGNOSIS — E119 Type 2 diabetes mellitus without complications: Secondary | ICD-10-CM | POA: Diagnosis not present

## 2017-09-07 DIAGNOSIS — K219 Gastro-esophageal reflux disease without esophagitis: Secondary | ICD-10-CM | POA: Diagnosis not present

## 2017-09-07 DIAGNOSIS — Z4781 Encounter for orthopedic aftercare following surgical amputation: Secondary | ICD-10-CM | POA: Diagnosis not present

## 2017-09-07 DIAGNOSIS — F1011 Alcohol abuse, in remission: Secondary | ICD-10-CM | POA: Diagnosis not present

## 2017-09-07 DIAGNOSIS — Z794 Long term (current) use of insulin: Secondary | ICD-10-CM | POA: Diagnosis not present

## 2017-09-07 DIAGNOSIS — Z89432 Acquired absence of left foot: Secondary | ICD-10-CM | POA: Diagnosis not present

## 2017-09-07 DIAGNOSIS — K860 Alcohol-induced chronic pancreatitis: Secondary | ICD-10-CM | POA: Diagnosis not present

## 2017-09-07 DIAGNOSIS — M86172 Other acute osteomyelitis, left ankle and foot: Secondary | ICD-10-CM | POA: Diagnosis not present

## 2017-09-07 DIAGNOSIS — I10 Essential (primary) hypertension: Secondary | ICD-10-CM | POA: Diagnosis not present

## 2017-09-07 DIAGNOSIS — Z89421 Acquired absence of other right toe(s): Secondary | ICD-10-CM | POA: Diagnosis not present

## 2017-09-12 DIAGNOSIS — Z4781 Encounter for orthopedic aftercare following surgical amputation: Secondary | ICD-10-CM | POA: Diagnosis not present

## 2017-09-12 DIAGNOSIS — K860 Alcohol-induced chronic pancreatitis: Secondary | ICD-10-CM | POA: Diagnosis not present

## 2017-09-12 DIAGNOSIS — Z89432 Acquired absence of left foot: Secondary | ICD-10-CM | POA: Diagnosis not present

## 2017-09-12 DIAGNOSIS — E119 Type 2 diabetes mellitus without complications: Secondary | ICD-10-CM | POA: Diagnosis not present

## 2017-09-12 DIAGNOSIS — M86172 Other acute osteomyelitis, left ankle and foot: Secondary | ICD-10-CM | POA: Diagnosis not present

## 2017-09-12 DIAGNOSIS — I10 Essential (primary) hypertension: Secondary | ICD-10-CM | POA: Diagnosis not present

## 2017-09-19 DIAGNOSIS — Z4781 Encounter for orthopedic aftercare following surgical amputation: Secondary | ICD-10-CM | POA: Diagnosis not present

## 2017-09-19 DIAGNOSIS — K860 Alcohol-induced chronic pancreatitis: Secondary | ICD-10-CM | POA: Diagnosis not present

## 2017-09-19 DIAGNOSIS — I10 Essential (primary) hypertension: Secondary | ICD-10-CM | POA: Diagnosis not present

## 2017-09-19 DIAGNOSIS — Z89432 Acquired absence of left foot: Secondary | ICD-10-CM | POA: Diagnosis not present

## 2017-09-19 DIAGNOSIS — E119 Type 2 diabetes mellitus without complications: Secondary | ICD-10-CM | POA: Diagnosis not present

## 2017-09-19 DIAGNOSIS — M86172 Other acute osteomyelitis, left ankle and foot: Secondary | ICD-10-CM | POA: Diagnosis not present

## 2017-10-07 ENCOUNTER — Other Ambulatory Visit: Payer: Self-pay | Admitting: "Endocrinology

## 2017-10-11 DIAGNOSIS — E1065 Type 1 diabetes mellitus with hyperglycemia: Secondary | ICD-10-CM | POA: Diagnosis not present

## 2017-10-11 DIAGNOSIS — E108 Type 1 diabetes mellitus with unspecified complications: Secondary | ICD-10-CM | POA: Diagnosis not present

## 2017-10-12 LAB — COMPLETE METABOLIC PANEL WITH GFR
AG RATIO: 1.2 (calc) (ref 1.0–2.5)
ALBUMIN MSPROF: 4.1 g/dL (ref 3.6–5.1)
ALKALINE PHOSPHATASE (APISO): 99 U/L (ref 40–115)
ALT: 29 U/L (ref 9–46)
AST: 28 U/L (ref 10–40)
BUN: 8 mg/dL (ref 7–25)
CHLORIDE: 98 mmol/L (ref 98–110)
CO2: 25 mmol/L (ref 20–32)
Calcium: 9.9 mg/dL (ref 8.6–10.3)
Creat: 0.71 mg/dL (ref 0.60–1.35)
GFR, Est African American: 129 mL/min/{1.73_m2} (ref >=60)
GFR, Est Non African American: 112 mL/min/{1.73_m2} (ref >=60)
GLUCOSE: 305 mg/dL — AB (ref 65–99)
Globulin: 3.3 g/dL (calc) (ref 1.9–3.7)
POTASSIUM: 4.5 mmol/L (ref 3.5–5.3)
Sodium: 132 mmol/L — ABNORMAL LOW (ref 135–146)
Total Bilirubin: 0.9 mg/dL (ref 0.2–1.2)
Total Protein: 7.4 g/dL (ref 6.1–8.1)

## 2017-10-12 LAB — HEMOGLOBIN A1C
EAG (MMOL/L): 10.8 (calc)
HEMOGLOBIN A1C: 8.4 %{Hb} — AB (ref <5.7)
Mean Plasma Glucose: 194 (calc)

## 2017-10-14 ENCOUNTER — Ambulatory Visit (INDEPENDENT_AMBULATORY_CARE_PROVIDER_SITE_OTHER): Payer: Medicare Other | Admitting: "Endocrinology

## 2017-10-14 ENCOUNTER — Encounter: Payer: Self-pay | Admitting: "Endocrinology

## 2017-10-14 VITALS — BP 138/74 | HR 106 | Ht 73.0 in | Wt 146.0 lb

## 2017-10-14 DIAGNOSIS — K8681 Exocrine pancreatic insufficiency: Secondary | ICD-10-CM

## 2017-10-14 DIAGNOSIS — F1010 Alcohol abuse, uncomplicated: Secondary | ICD-10-CM

## 2017-10-14 DIAGNOSIS — E1065 Type 1 diabetes mellitus with hyperglycemia: Secondary | ICD-10-CM

## 2017-10-14 DIAGNOSIS — E108 Type 1 diabetes mellitus with unspecified complications: Secondary | ICD-10-CM

## 2017-10-14 DIAGNOSIS — I10 Essential (primary) hypertension: Secondary | ICD-10-CM | POA: Diagnosis not present

## 2017-10-14 DIAGNOSIS — IMO0002 Reserved for concepts with insufficient information to code with codable children: Secondary | ICD-10-CM

## 2017-10-14 MED ORDER — INSULIN ASPART PROT & ASPART (70-30 MIX) 100 UNIT/ML PEN: PEN_INJECTOR | SUBCUTANEOUS | 0 refills | Status: DC

## 2017-10-14 NOTE — Progress Notes (Signed)
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 provided.   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 I have counseled patient to wean himself off of alcohol utilizing local [employer]. However he says that he has done it before and he will do it by himself.  In the meantime I advised him to increase his Creon to 2 capsules 3 times a day with meals and one capsule with snacks. He will need the support for life.   6) Chronic Care/Health Maintenance:  -Patient is  encouraged to continue to follow up with Ophthalmology, Podiatrist at least yearly or according to recommendations. - Unfortunately, he continues to consume large quantities of alcohol regularly. See above.  and advised to quit smoking and heavy drinking . I have recommended yearly flu vaccine and pneumonia vaccination at least every 5 years; he cannot  exercise optimally,  and  sleep for at least 7 hours a day.  - Time spent with the patient: 25 min, of which >50% was spent in reviewing his blood glucose logs , discussing his hypo- and hyper-glycemic episodes, reviewing his current and  previous labs and insulin doses and developing a plan to avoid hypo- and hyper-glycemia. Please refer to Patient Instructions for Blood Glucose Monitoring and Insulin/Medications Dosing Guide"  in media tab for additional information.   I advised patient to maintain close follow up with his PCP for primary care needs. Follow up plan: Return in about 3 months (around 01/12/2018) for follow up with pre-visit labs, meter, and logs.  Marquis Lunch, MD Phone: [phone]  Fax: [phone]  -  This note was partially dictated with voice recognition software. Similar sounding words can be transcribed inadequately or may not  be corrected upon review.  10/14/2017, 10:38 AM

## 2017-10-16 ENCOUNTER — Ambulatory Visit: Payer: Medicare Other | Admitting: Podiatry

## 2017-10-17 DIAGNOSIS — Z4781 Encounter for orthopedic aftercare following surgical amputation: Secondary | ICD-10-CM | POA: Diagnosis not present

## 2017-10-17 DIAGNOSIS — K860 Alcohol-induced chronic pancreatitis: Secondary | ICD-10-CM | POA: Diagnosis not present

## 2017-10-17 DIAGNOSIS — M86172 Other acute osteomyelitis, left ankle and foot: Secondary | ICD-10-CM | POA: Diagnosis not present

## 2017-10-17 DIAGNOSIS — Z89432 Acquired absence of left foot: Secondary | ICD-10-CM | POA: Diagnosis not present

## 2017-10-17 DIAGNOSIS — E119 Type 2 diabetes mellitus without complications: Secondary | ICD-10-CM | POA: Diagnosis not present

## 2017-10-17 DIAGNOSIS — I10 Essential (primary) hypertension: Secondary | ICD-10-CM | POA: Diagnosis not present

## 2017-10-18 ENCOUNTER — Ambulatory Visit (INDEPENDENT_AMBULATORY_CARE_PROVIDER_SITE_OTHER): Payer: Medicare Other | Admitting: Podiatry

## 2017-10-18 DIAGNOSIS — E0842 Diabetes mellitus due to underlying condition with diabetic polyneuropathy: Secondary | ICD-10-CM

## 2017-10-30 ENCOUNTER — Ambulatory Visit: Payer: Medicare Other | Admitting: Orthotics

## 2017-10-30 DIAGNOSIS — E0842 Diabetes mellitus due to underlying condition with diabetic polyneuropathy: Secondary | ICD-10-CM

## 2017-10-30 DIAGNOSIS — Z89412 Acquired absence of left great toe: Secondary | ICD-10-CM

## 2017-10-30 DIAGNOSIS — L97522 Non-pressure chronic ulcer of other part of left foot with fat layer exposed: Secondary | ICD-10-CM

## 2017-10-30 NOTE — Progress Notes (Signed)
Patient was seen evaluated today for diabetic shoes and transmet toe filler LEFT.  [place]; DPM is [place]. Marland Kitchen...

## 2017-11-04 NOTE — Progress Notes (Signed)
Patient presents today to pick up his custom molded diabetic shoes with toe fillers.  Patient recently had midfoot amputation.  Shoes were dispensed today by medical assistant.  Patient was not seen by the physician today.  Break-in instructions were provided.  Return to clinic as needed

## 2017-11-09 ENCOUNTER — Other Ambulatory Visit: Payer: Self-pay | Admitting: "Endocrinology

## 2017-11-29 ENCOUNTER — Other Ambulatory Visit: Payer: Self-pay | Admitting: "Endocrinology

## 2018-01-10 ENCOUNTER — Other Ambulatory Visit: Payer: Self-pay | Admitting: "Endocrinology

## 2018-01-10 DIAGNOSIS — E1065 Type 1 diabetes mellitus with hyperglycemia: Secondary | ICD-10-CM | POA: Diagnosis not present

## 2018-01-10 DIAGNOSIS — E108 Type 1 diabetes mellitus with unspecified complications: Secondary | ICD-10-CM | POA: Diagnosis not present

## 2018-01-10 LAB — COMPLETE METABOLIC PANEL WITH GFR
AG RATIO: 1.6 (calc) (ref 1.0–2.5)
ALT: 98 U/L — AB (ref 9–46)
AST: 103 U/L — ABNORMAL HIGH (ref 10–40)
Albumin: 4.6 g/dL (ref 3.6–5.1)
Alkaline phosphatase (APISO): 155 U/L — ABNORMAL HIGH (ref 40–115)
BILIRUBIN TOTAL: 1.8 mg/dL — AB (ref 0.2–1.2)
BUN: 10 mg/dL (ref 7–25)
CHLORIDE: 97 mmol/L — AB (ref 98–110)
CO2: 24 mmol/L (ref 20–32)
Calcium: 10.2 mg/dL (ref 8.6–10.3)
Creat: 0.8 mg/dL (ref 0.60–1.35)
GFR, EST AFRICAN AMERICAN: 123 mL/min/{1.73_m2} (ref >=60)
GFR, Est Non African American: 106 mL/min/{1.73_m2} (ref >=60)
Globulin: 2.8 g/dL (calc) (ref 1.9–3.7)
Glucose, Bld: 372 mg/dL — ABNORMAL HIGH (ref 65–99)
Potassium: 4.6 mmol/L (ref 3.5–5.3)
SODIUM: 131 mmol/L — AB (ref 135–146)
TOTAL PROTEIN: 7.4 g/dL (ref 6.1–8.1)

## 2018-01-10 LAB — HEMOGLOBIN A1C
EAG (MMOL/L): 14.4 (calc)
Hgb A1c MFr Bld: 10.7 % of total Hgb — ABNORMAL HIGH (ref <5.7)
Mean Plasma Glucose: 260 (calc)

## 2018-01-14 ENCOUNTER — Ambulatory Visit (INDEPENDENT_AMBULATORY_CARE_PROVIDER_SITE_OTHER): Payer: Medicare Other | Admitting: "Endocrinology

## 2018-01-14 ENCOUNTER — Encounter: Payer: Self-pay | Admitting: "Endocrinology

## 2018-01-14 VITALS — BP 138/87 | HR 96 | Ht 73.0 in | Wt 142.0 lb

## 2018-01-14 DIAGNOSIS — E108 Type 1 diabetes mellitus with unspecified complications: Secondary | ICD-10-CM

## 2018-01-14 DIAGNOSIS — K8681 Exocrine pancreatic insufficiency: Secondary | ICD-10-CM | POA: Diagnosis not present

## 2018-01-14 DIAGNOSIS — IMO0002 Reserved for concepts with insufficient information to code with codable children: Secondary | ICD-10-CM

## 2018-01-14 DIAGNOSIS — I10 Essential (primary) hypertension: Secondary | ICD-10-CM | POA: Diagnosis not present

## 2018-01-14 DIAGNOSIS — E1065 Type 1 diabetes mellitus with hyperglycemia: Secondary | ICD-10-CM

## 2018-01-14 DIAGNOSIS — F1010 Alcohol abuse, uncomplicated: Secondary | ICD-10-CM | POA: Diagnosis not present

## 2018-01-14 MED ORDER — INSULIN ASPART PROT & ASPART (70-30 MIX) 100 UNIT/ML PEN: PEN_INJECTOR | SUBCUTANEOUS | 2 refills | Status: DC

## 2018-01-14 NOTE — Patient Instructions (Signed)
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
## 2018-02-04 ENCOUNTER — Ambulatory Visit (INDEPENDENT_AMBULATORY_CARE_PROVIDER_SITE_OTHER): Payer: Medicare Other | Admitting: Orthotics

## 2018-02-04 DIAGNOSIS — E1169 Type 2 diabetes mellitus with other specified complication: Secondary | ICD-10-CM

## 2018-02-04 DIAGNOSIS — E11621 Type 2 diabetes mellitus with foot ulcer: Secondary | ICD-10-CM | POA: Diagnosis not present

## 2018-02-04 DIAGNOSIS — L089 Local infection of the skin and subcutaneous tissue, unspecified: Secondary | ICD-10-CM

## 2018-02-04 DIAGNOSIS — M869 Osteomyelitis, unspecified: Secondary | ICD-10-CM

## 2018-02-04 DIAGNOSIS — E0842 Diabetes mellitus due to underlying condition with diabetic polyneuropathy: Secondary | ICD-10-CM

## 2018-02-04 DIAGNOSIS — L97523 Non-pressure chronic ulcer of other part of left foot with necrosis of muscle: Secondary | ICD-10-CM

## 2018-02-04 DIAGNOSIS — E11628 Type 2 diabetes mellitus with other skin complications: Secondary | ICD-10-CM

## 2018-02-04 DIAGNOSIS — L97509 Non-pressure chronic ulcer of other part of unspecified foot with unspecified severity: Secondary | ICD-10-CM

## 2018-02-04 DIAGNOSIS — Z89412 Acquired absence of left great toe: Secondary | ICD-10-CM

## 2018-02-04 NOTE — Progress Notes (Signed)
Patient came in today to pick up diabetic shoes and custom inserts including tranmet filler left.  Same was well pleased with fit and function.   The patient could ambulate without any discomfort; there were no signs of any quality issues. The foot ortheses offered full contact with plantar surface and contoured the arch well.   The shoes fit well with no heel slippage and areas of pressure concern.   Patient advised to contact us if any problems arise.  Patient also advised on how to report any issues.

## 2018-02-08 ENCOUNTER — Other Ambulatory Visit: Payer: Self-pay | Admitting: "Endocrinology

## 2018-02-13 ENCOUNTER — Other Ambulatory Visit: Payer: Self-pay | Admitting: "Endocrinology

## 2018-04-04 DIAGNOSIS — E1142 Type 2 diabetes mellitus with diabetic polyneuropathy: Secondary | ICD-10-CM | POA: Diagnosis not present

## 2018-04-04 DIAGNOSIS — B351 Tinea unguium: Secondary | ICD-10-CM | POA: Diagnosis not present

## 2018-04-18 ENCOUNTER — Ambulatory Visit: Payer: Medicare Other | Admitting: "Endocrinology

## 2018-05-02 DIAGNOSIS — E108 Type 1 diabetes mellitus with unspecified complications: Secondary | ICD-10-CM | POA: Diagnosis not present

## 2018-05-02 DIAGNOSIS — E1065 Type 1 diabetes mellitus with hyperglycemia: Secondary | ICD-10-CM | POA: Diagnosis not present

## 2018-05-03 LAB — LIPID PANEL
Cholesterol: 173 mg/dL (ref <200)
HDL: 82 mg/dL (ref >40)
LDL Cholesterol (Calc): 77 mg/dL (calc)
NON-HDL CHOLESTEROL (CALC): 91 mg/dL (ref <130)
TRIGLYCERIDES: 55 mg/dL (ref <150)
Total CHOL/HDL Ratio: 2.1 (calc) (ref <5.0)

## 2018-05-03 LAB — COMPLETE METABOLIC PANEL WITH GFR
AG Ratio: 1.6 (calc) (ref 1.0–2.5)
ALKALINE PHOSPHATASE (APISO): 146 U/L — AB (ref 40–115)
ALT: 97 U/L — AB (ref 9–46)
AST: 116 U/L — AB (ref 10–40)
Albumin: 4.5 g/dL (ref 3.6–5.1)
BUN: 11 mg/dL (ref 7–25)
CO2: 20 mmol/L (ref 20–32)
CREATININE: 0.81 mg/dL (ref 0.60–1.35)
Calcium: 9.6 mg/dL (ref 8.6–10.3)
Chloride: 99 mmol/L (ref 98–110)
GFR, Est African American: 123 mL/min/{1.73_m2} (ref >=60)
GFR, Est Non African American: 106 mL/min/{1.73_m2} (ref >=60)
GLUCOSE: 286 mg/dL — AB (ref 65–99)
Globulin: 2.9 g/dL (calc) (ref 1.9–3.7)
Potassium: 3.9 mmol/L (ref 3.5–5.3)
SODIUM: 133 mmol/L — AB (ref 135–146)
Total Bilirubin: 1.2 mg/dL (ref 0.2–1.2)
Total Protein: 7.4 g/dL (ref 6.1–8.1)

## 2018-05-03 LAB — MICROALBUMIN / CREATININE URINE RATIO
CREATININE, URINE: 159 mg/dL (ref 20–320)
MICROALB UR: 48 mg/dL
Microalb Creat Ratio: 302 mcg/mg creat — ABNORMAL HIGH (ref <30)

## 2018-05-03 LAB — T4, FREE: Free T4: 1.2 ng/dL (ref 0.8–1.8)

## 2018-05-03 LAB — TSH: TSH: 1.43 mIU/L (ref 0.40–4.50)

## 2018-05-03 LAB — HEMOGLOBIN A1C
EAG (MMOL/L): 14.1 (calc)
HEMOGLOBIN A1C: 10.5 %{Hb} — AB (ref <5.7)
MEAN PLASMA GLUCOSE: 255 (calc)

## 2018-05-06 ENCOUNTER — Encounter: Payer: Self-pay | Admitting: "Endocrinology

## 2018-05-06 ENCOUNTER — Ambulatory Visit (INDEPENDENT_AMBULATORY_CARE_PROVIDER_SITE_OTHER): Payer: Medicare Other | Admitting: "Endocrinology

## 2018-05-06 VITALS — BP 131/84 | HR 90 | Ht 73.0 in | Wt 141.0 lb

## 2018-05-06 DIAGNOSIS — K8681 Exocrine pancreatic insufficiency: Secondary | ICD-10-CM | POA: Diagnosis not present

## 2018-05-06 DIAGNOSIS — F1010 Alcohol abuse, uncomplicated: Secondary | ICD-10-CM

## 2018-05-06 DIAGNOSIS — E108 Type 1 diabetes mellitus with unspecified complications: Secondary | ICD-10-CM | POA: Diagnosis not present

## 2018-05-06 DIAGNOSIS — IMO0002 Reserved for concepts with insufficient information to code with codable children: Secondary | ICD-10-CM

## 2018-05-06 DIAGNOSIS — E1065 Type 1 diabetes mellitus with hyperglycemia: Secondary | ICD-10-CM | POA: Diagnosis not present

## 2018-05-06 DIAGNOSIS — I10 Essential (primary) hypertension: Secondary | ICD-10-CM | POA: Diagnosis not present

## 2018-05-06 MED ORDER — INSULIN ASPART PROT & ASPART (70-30 MIX) 100 UNIT/ML PEN: PEN_INJECTOR | SUBCUTANEOUS | 0 refills | Status: DC
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
## 2018-05-29 ENCOUNTER — Other Ambulatory Visit: Payer: Self-pay | Admitting: "Endocrinology

## 2018-06-13 DIAGNOSIS — E1142 Type 2 diabetes mellitus with diabetic polyneuropathy: Secondary | ICD-10-CM | POA: Diagnosis not present

## 2018-06-13 DIAGNOSIS — B351 Tinea unguium: Secondary | ICD-10-CM | POA: Diagnosis not present

## 2018-07-08 ENCOUNTER — Other Ambulatory Visit: Payer: Self-pay | Admitting: "Endocrinology

## 2018-07-22 ENCOUNTER — Other Ambulatory Visit: Payer: Self-pay | Admitting: "Endocrinology

## 2018-08-07 DIAGNOSIS — E1065 Type 1 diabetes mellitus with hyperglycemia: Secondary | ICD-10-CM | POA: Diagnosis not present

## 2018-08-07 DIAGNOSIS — E108 Type 1 diabetes mellitus with unspecified complications: Secondary | ICD-10-CM | POA: Diagnosis not present

## 2018-08-08 ENCOUNTER — Encounter: Payer: Self-pay | Admitting: "Endocrinology

## 2018-08-08 ENCOUNTER — Ambulatory Visit (INDEPENDENT_AMBULATORY_CARE_PROVIDER_SITE_OTHER): Payer: Medicare Other | Admitting: "Endocrinology

## 2018-08-08 VITALS — BP 124/88 | HR 97 | Ht 73.0 in | Wt 143.0 lb

## 2018-08-08 DIAGNOSIS — I10 Essential (primary) hypertension: Secondary | ICD-10-CM | POA: Diagnosis not present

## 2018-08-08 DIAGNOSIS — IMO0002 Reserved for concepts with insufficient information to code with codable children: Secondary | ICD-10-CM

## 2018-08-08 DIAGNOSIS — E108 Type 1 diabetes mellitus with unspecified complications: Secondary | ICD-10-CM | POA: Diagnosis not present

## 2018-08-08 DIAGNOSIS — E1065 Type 1 diabetes mellitus with hyperglycemia: Secondary | ICD-10-CM

## 2018-08-08 DIAGNOSIS — K8681 Exocrine pancreatic insufficiency: Secondary | ICD-10-CM

## 2018-08-08 LAB — COMPLETE METABOLIC PANEL WITH GFR
AG Ratio: 1.5 (calc) (ref 1.0–2.5)
ALT: 69 U/L — AB (ref 9–46)
AST: 88 U/L — AB (ref 10–40)
Albumin: 4.4 g/dL (ref 3.6–5.1)
Alkaline phosphatase (APISO): 129 U/L — ABNORMAL HIGH (ref 40–115)
BUN: 8 mg/dL (ref 7–25)
CO2: 25 mmol/L (ref 20–32)
Calcium: 9.7 mg/dL (ref 8.6–10.3)
Chloride: 105 mmol/L (ref 98–110)
Creat: 0.79 mg/dL (ref 0.60–1.35)
GFR, EST AFRICAN AMERICAN: 123 mL/min/{1.73_m2} (ref >=60)
GFR, EST NON AFRICAN AMERICAN: 106 mL/min/{1.73_m2} (ref >=60)
GLUCOSE: 333 mg/dL — AB (ref 65–99)
Globulin: 3 g/dL (calc) (ref 1.9–3.7)
Potassium: 4.1 mmol/L (ref 3.5–5.3)
Sodium: 139 mmol/L (ref 135–146)
TOTAL PROTEIN: 7.4 g/dL (ref 6.1–8.1)
Total Bilirubin: 0.9 mg/dL (ref 0.2–1.2)

## 2018-08-08 LAB — HEMOGLOBIN A1C
EAG (MMOL/L): 14.1 (calc)
Hgb A1c MFr Bld: 10.5 % of total Hgb — ABNORMAL HIGH (ref <5.7)
Mean Plasma Glucose: 255 (calc)
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
## 2018-08-18 ENCOUNTER — Other Ambulatory Visit: Payer: Self-pay | Admitting: "Endocrinology

## 2018-08-29 DIAGNOSIS — E1142 Type 2 diabetes mellitus with diabetic polyneuropathy: Secondary | ICD-10-CM | POA: Diagnosis not present

## 2018-08-29 DIAGNOSIS — B351 Tinea unguium: Secondary | ICD-10-CM | POA: Diagnosis not present

## 2018-09-23 ENCOUNTER — Other Ambulatory Visit: Payer: Self-pay | Admitting: "Endocrinology

## 2018-11-07 DIAGNOSIS — E1142 Type 2 diabetes mellitus with diabetic polyneuropathy: Secondary | ICD-10-CM | POA: Diagnosis not present

## 2018-11-07 DIAGNOSIS — B351 Tinea unguium: Secondary | ICD-10-CM | POA: Diagnosis not present

## 2018-11-27 ENCOUNTER — Other Ambulatory Visit: Payer: Self-pay | Admitting: "Endocrinology

## 2018-12-04 DIAGNOSIS — E1065 Type 1 diabetes mellitus with hyperglycemia: Secondary | ICD-10-CM | POA: Diagnosis not present

## 2018-12-04 DIAGNOSIS — E108 Type 1 diabetes mellitus with unspecified complications: Secondary | ICD-10-CM | POA: Diagnosis not present

## 2018-12-05 LAB — COMPLETE METABOLIC PANEL WITH GFR
AG RATIO: 2 (calc) (ref 1.0–2.5)
ALKALINE PHOSPHATASE (APISO): 122 U/L (ref 36–130)
ALT: 102 U/L — ABNORMAL HIGH (ref 9–46)
AST: 94 U/L — AB (ref 10–40)
Albumin: 4.7 g/dL (ref 3.6–5.1)
BILIRUBIN TOTAL: 0.6 mg/dL (ref 0.2–1.2)
BUN: 15 mg/dL (ref 7–25)
CALCIUM: 9.6 mg/dL (ref 8.6–10.3)
CHLORIDE: 104 mmol/L (ref 98–110)
CO2: 24 mmol/L (ref 20–32)
Creat: 0.82 mg/dL (ref 0.60–1.35)
GFR, Est African American: 121 mL/min/{1.73_m2} (ref >=60)
GFR, Est Non African American: 105 mL/min/{1.73_m2} (ref >=60)
GLOBULIN: 2.3 g/dL (ref 1.9–3.7)
Glucose, Bld: 278 mg/dL — ABNORMAL HIGH (ref 65–99)
POTASSIUM: 4.8 mmol/L (ref 3.5–5.3)
Sodium: 134 mmol/L — ABNORMAL LOW (ref 135–146)
Total Protein: 7 g/dL (ref 6.1–8.1)

## 2018-12-05 LAB — HEMOGLOBIN A1C
EAG (MMOL/L): 14.3 (calc)
HEMOGLOBIN A1C: 10.6 %{Hb} — AB (ref <5.7)
MEAN PLASMA GLUCOSE: 258 (calc)

## 2018-12-08 ENCOUNTER — Other Ambulatory Visit: Payer: Self-pay

## 2018-12-08 ENCOUNTER — Ambulatory Visit (INDEPENDENT_AMBULATORY_CARE_PROVIDER_SITE_OTHER): Payer: Medicare Other | Admitting: "Endocrinology

## 2018-12-08 ENCOUNTER — Encounter: Payer: Self-pay | Admitting: "Endocrinology

## 2018-12-08 DIAGNOSIS — E108 Type 1 diabetes mellitus with unspecified complications: Secondary | ICD-10-CM

## 2018-12-08 DIAGNOSIS — K8681 Exocrine pancreatic insufficiency: Secondary | ICD-10-CM | POA: Diagnosis not present

## 2018-12-08 DIAGNOSIS — IMO0002 Reserved for concepts with insufficient information to code with codable children: Secondary | ICD-10-CM

## 2018-12-08 DIAGNOSIS — Z720 Tobacco use: Secondary | ICD-10-CM

## 2018-12-08 DIAGNOSIS — E1065 Type 1 diabetes mellitus with hyperglycemia: Secondary | ICD-10-CM

## 2018-12-08 DIAGNOSIS — F1010 Alcohol abuse, uncomplicated: Secondary | ICD-10-CM

## 2018-12-08 DIAGNOSIS — Z9119 Patient's noncompliance with other medical treatment and regimen: Secondary | ICD-10-CM
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
## 2019-01-06 ENCOUNTER — Encounter: Payer: Self-pay | Admitting: "Endocrinology

## 2019-01-08 IMAGING — MR MR FOOT*L* W/O CM
4 of 6 series · 20 of 40 positions shown · non-contrast
Comparison: None.

CLINICAL DATA: Chronic open wounds on the left foot.

EXAM:
MRI OF THE LEFT FOOT WITHOUT CONTRAST
TECHNIQUE: Multiplanar, multisequence MR imaging of the left foot was
performed. No intravenous contrast was administered.

[Series 9: T1 · oblique · 3.0mm · 0.28mm/px · 11 of 46 slices shown (1 of 2)]
[im 1/46]
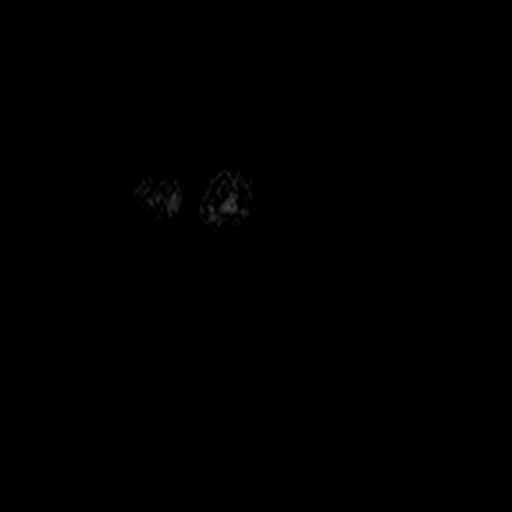
[im 5/46]
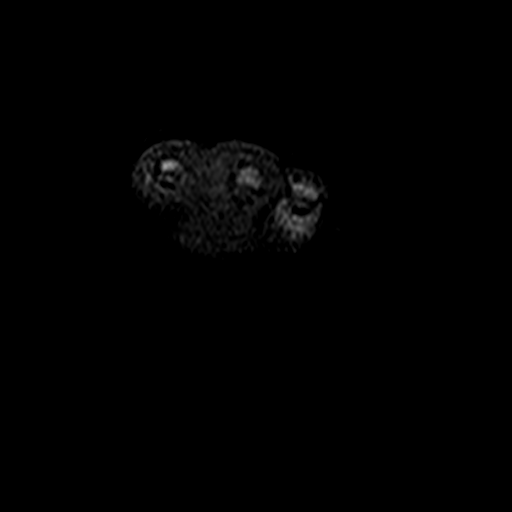
[im 10/46]
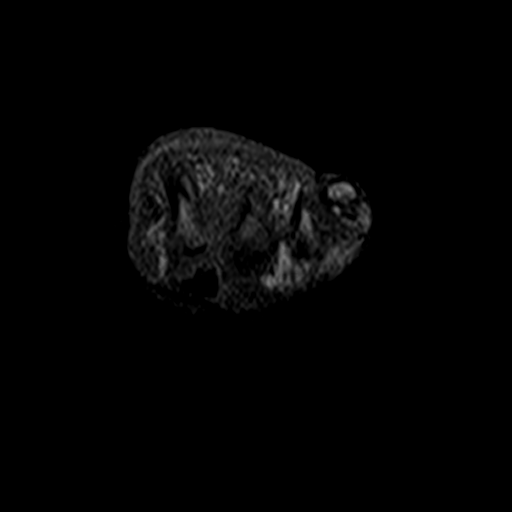
[im 14/46]
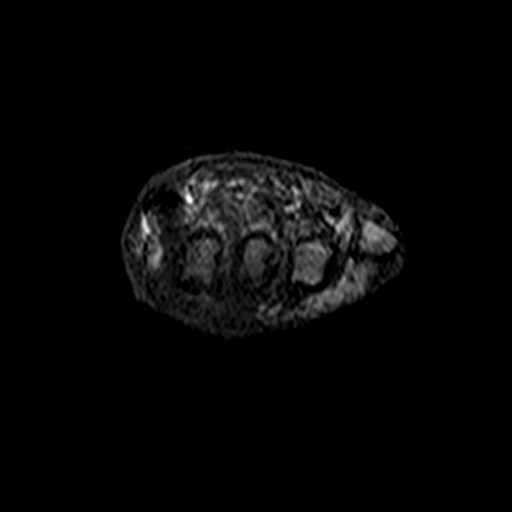
[im 19/46]
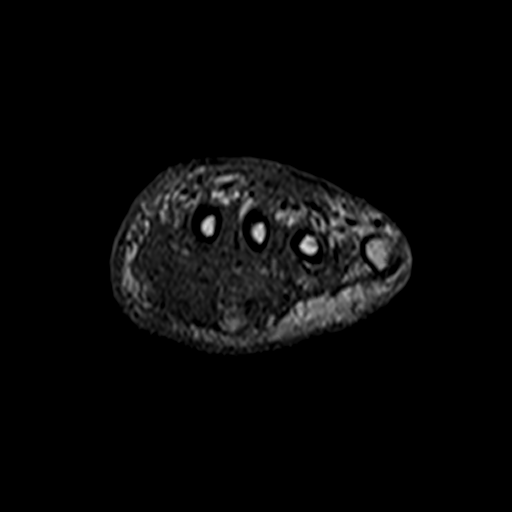
[im 23/46]
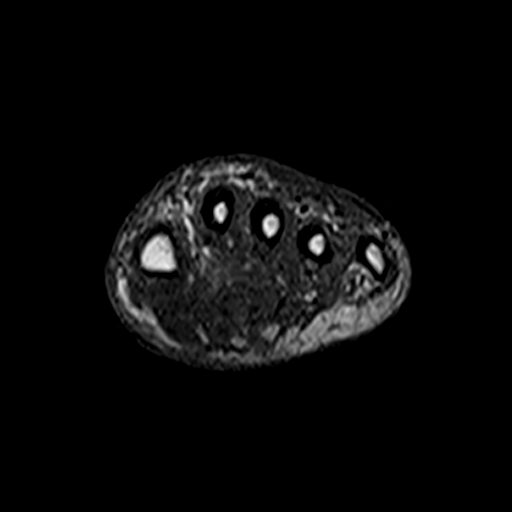
[im 28/46]
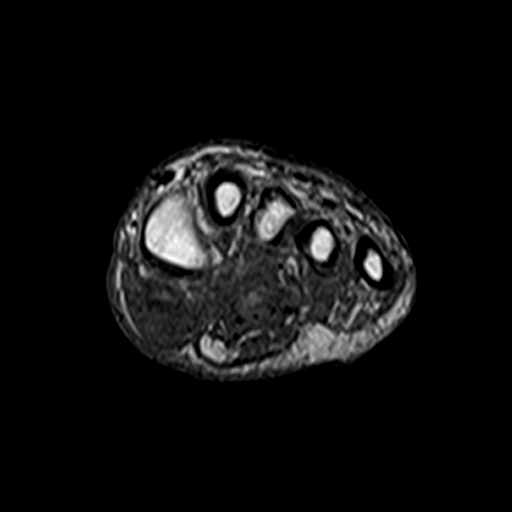
[im 32/46]
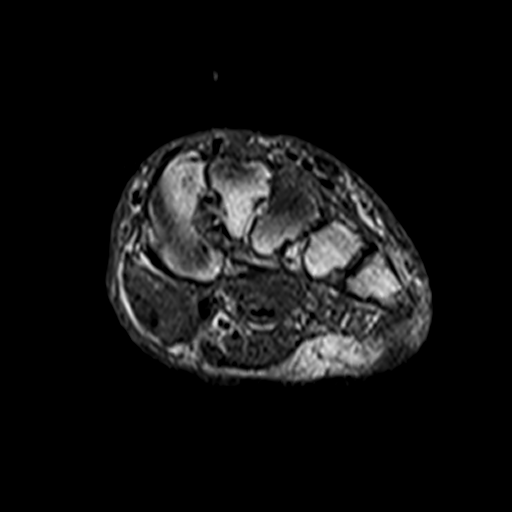
[im 37/46]
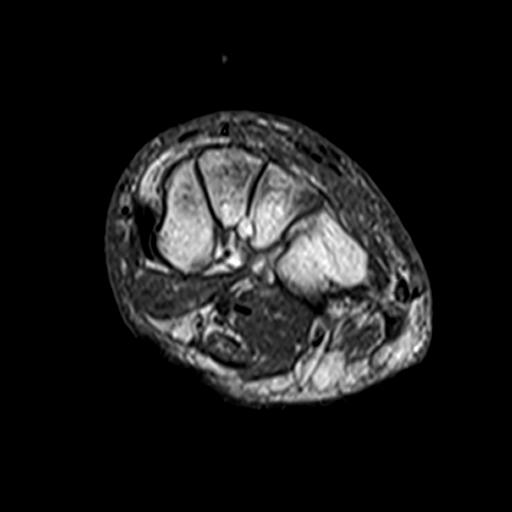
[im 41/46]
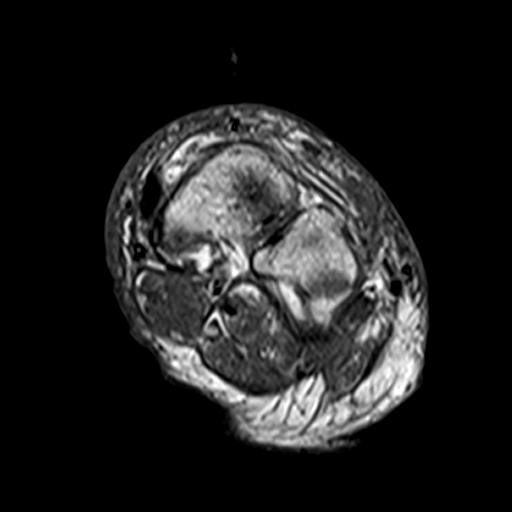
[im 46/46]
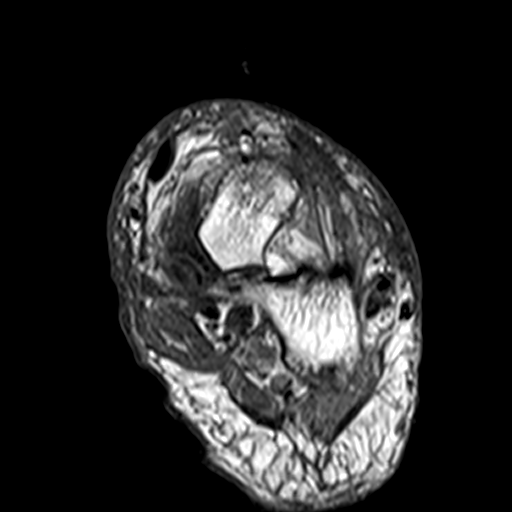

[Series 10: t2fs axial · oblique · 3.0mm · 0.37mm/px · 3 of 46 slices shown]
[im 6/46]
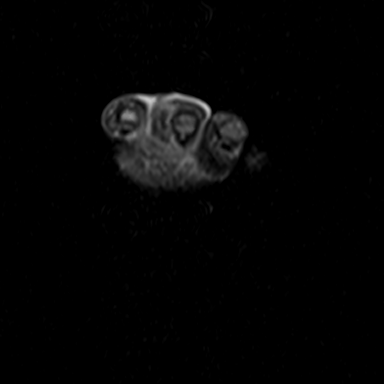
[im 26/46]
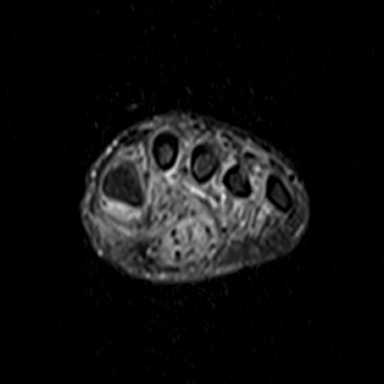
[im 41/46]
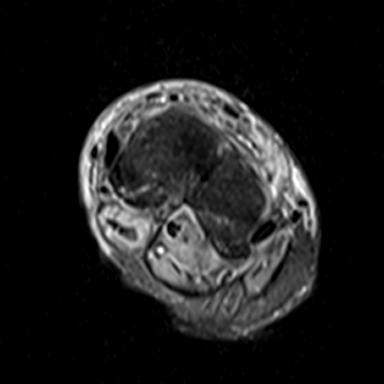

[Series 11: ir sagital · sagittal · 3.0mm · 0.35mm/px · 3 of 26 slices shown]
[im 6/26]
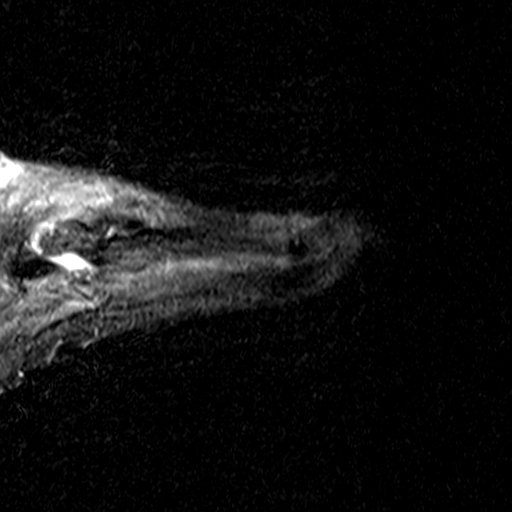
[im 16/26]
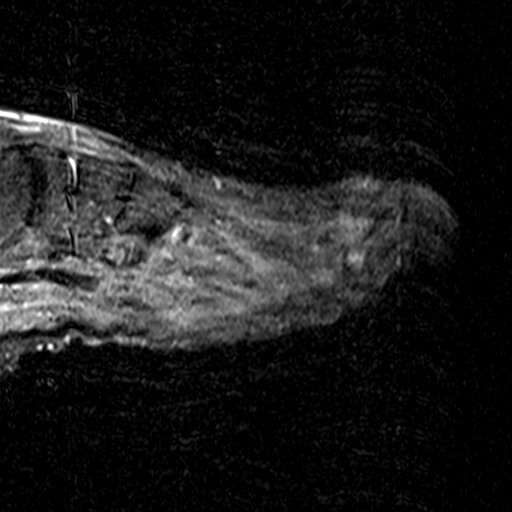
[im 26/26]
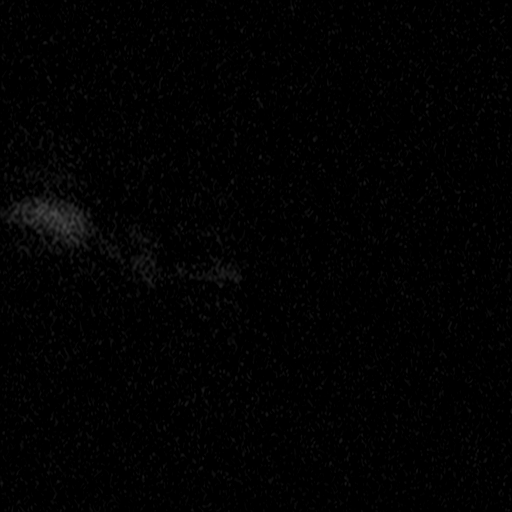

[Series 15: T1 · coronal · 3.0mm · 0.35mm/px · 3 of 19 slices shown (2 of 2)]
[im 1/19]
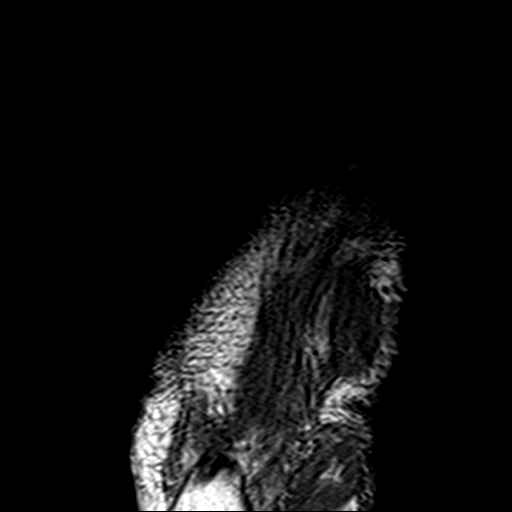
[im 13/19]
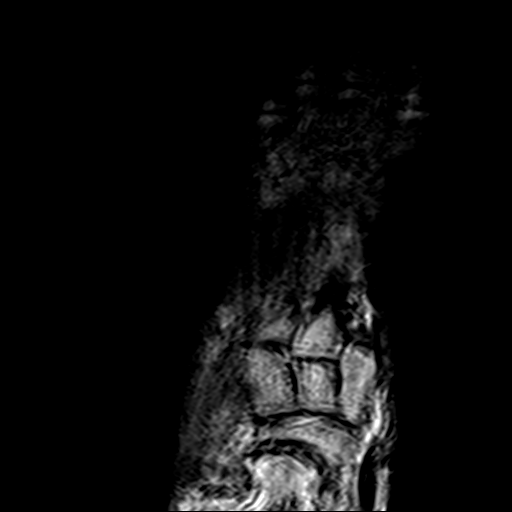
[im 19/19]
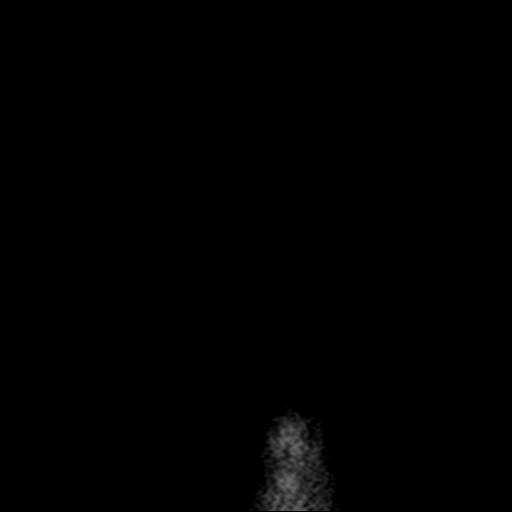

[20 of 40 positions shown; findings below may reference images not displayed]

FINDINGS: Severe patient motion degrading image quality limiting evaluation.

Bones/Joint/Cartilage

Transverse metatarsal amputation.

Soft tissue ulcer involving the second metatarsal head extending to
the cortex with cortical destruction and marrow signal abnormality
concerning for osteomyelitis, but it is difficult to be definitive
given the degree of patient motion.

No fracture or dislocation. Normal alignment.

Ligaments

Ligaments are suboptimally evaluated by CT.

Muscles and Tendons
No muscle atrophy. Increased T2 signal within the plantar
musculature likely neurogenic.

Soft tissue
No fluid collection or hematoma.  No soft tissue mass.
IMPRESSION: Severe patient motion degrading image quality limiting evaluation.

1. Soft tissue ulcer involving the second metatarsal head extending
to the cortex with cortical destruction and marrow signal
abnormality concerning for osteomyelitis, but it is difficult to be
definitive given the degree of patient motion.

## 2019-01-16 ENCOUNTER — Other Ambulatory Visit: Payer: Self-pay | Admitting: "Endocrinology

## 2019-01-23 DIAGNOSIS — B351 Tinea unguium: Secondary | ICD-10-CM | POA: Diagnosis not present

## 2019-01-23 DIAGNOSIS — E1142 Type 2 diabetes mellitus with diabetic polyneuropathy: Secondary | ICD-10-CM | POA: Diagnosis not present

## 2019-03-10 DIAGNOSIS — E108 Type 1 diabetes mellitus with unspecified complications: Secondary | ICD-10-CM | POA: Diagnosis not present

## 2019-03-10 DIAGNOSIS — E1065 Type 1 diabetes mellitus with hyperglycemia: Secondary | ICD-10-CM | POA: Diagnosis not present

## 2019-03-11 LAB — COMPLETE METABOLIC PANEL WITH GFR
AG Ratio: 1.8 (calc) (ref 1.0–2.5)
ALKALINE PHOSPHATASE (APISO): 96 U/L (ref 36–130)
ALT: 57 U/L — AB (ref 9–46)
AST: 56 U/L — ABNORMAL HIGH (ref 10–40)
Albumin: 4.5 g/dL (ref 3.6–5.1)
BILIRUBIN TOTAL: 1.3 mg/dL — AB (ref 0.2–1.2)
BUN: 8 mg/dL (ref 7–25)
CALCIUM: 9.8 mg/dL (ref 8.6–10.3)
CO2: 25 mmol/L (ref 20–32)
CREATININE: 0.75 mg/dL (ref 0.60–1.35)
Chloride: 99 mmol/L (ref 98–110)
GFR, Est African American: 126 mL/min/{1.73_m2} (ref >=60)
GFR, Est Non African American: 108 mL/min/{1.73_m2} (ref >=60)
GLOBULIN: 2.5 g/dL (ref 1.9–3.7)
GLUCOSE: 265 mg/dL — AB (ref 65–99)
POTASSIUM: 4.4 mmol/L (ref 3.5–5.3)
SODIUM: 133 mmol/L — AB (ref 135–146)
Total Protein: 7 g/dL (ref 6.1–8.1)

## 2019-03-11 LAB — HEMOGLOBIN A1C
HEMOGLOBIN A1C: 9.6 %{Hb} — AB (ref <5.7)
Mean Plasma Glucose: 229 (calc)
eAG (mmol/L): 12.7 (calc)

## 2019-03-12 ENCOUNTER — Other Ambulatory Visit: Payer: Self-pay

## 2019-03-12 ENCOUNTER — Encounter: Payer: Self-pay | Admitting: "Endocrinology

## 2019-03-12 ENCOUNTER — Ambulatory Visit (INDEPENDENT_AMBULATORY_CARE_PROVIDER_SITE_OTHER): Payer: Medicare Other | Admitting: "Endocrinology

## 2019-03-12 VITALS — BP 131/86 | HR 92 | Ht 73.0 in | Wt 143.0 lb

## 2019-03-12 DIAGNOSIS — I10 Essential (primary) hypertension: Secondary | ICD-10-CM

## 2019-03-12 DIAGNOSIS — IMO0002 Reserved for concepts with insufficient information to code with codable children: Secondary | ICD-10-CM

## 2019-03-12 DIAGNOSIS — E108 Type 1 diabetes mellitus with unspecified complications: Secondary | ICD-10-CM | POA: Diagnosis not present

## 2019-03-12 DIAGNOSIS — E1065 Type 1 diabetes mellitus with hyperglycemia: Secondary | ICD-10-CM | POA: Diagnosis not present

## 2019-03-12 DIAGNOSIS — K8681 Exocrine pancreatic insufficiency: Secondary | ICD-10-CM | POA: Diagnosis not present
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
## 2019-04-03 DIAGNOSIS — E1142 Type 2 diabetes mellitus with diabetic polyneuropathy: Secondary | ICD-10-CM | POA: Diagnosis not present

## 2019-04-03 DIAGNOSIS — B351 Tinea unguium: Secondary | ICD-10-CM | POA: Diagnosis not present

## 2019-04-30 ENCOUNTER — Other Ambulatory Visit: Payer: Self-pay | Admitting: "Endocrinology

## 2019-06-12 DIAGNOSIS — L851 Acquired keratosis [keratoderma] palmaris et plantaris: Secondary | ICD-10-CM | POA: Diagnosis not present

## 2019-06-12 DIAGNOSIS — B351 Tinea unguium: Secondary | ICD-10-CM | POA: Diagnosis not present

## 2019-06-12 DIAGNOSIS — E1142 Type 2 diabetes mellitus with diabetic polyneuropathy: Secondary | ICD-10-CM | POA: Diagnosis not present

## 2019-06-23 ENCOUNTER — Telehealth: Payer: Self-pay

## 2019-06-23 MED ORDER — INSULIN LISPRO PROT & LISPRO (75-25 MIX) 100 UNIT/ML KWIKPEN: 100.0000 [IU] | PEN_INJECTOR | Freq: Two times a day (BID) | SUBCUTANEOUS | 1 refills | Status: DC

## 2019-06-23 NOTE — Telephone Encounter (Signed)
Rx sent 

## 2019-06-23 NOTE — Telephone Encounter (Signed)
[facility] is requesting Novolog 70/30 to be changed to Humalog 75/25. Novolog 70/30 is on back order.

## 2019-06-23 NOTE — Telephone Encounter (Signed)
Proceed with switch, same dose.

## 2019-07-02 ENCOUNTER — Other Ambulatory Visit: Payer: Self-pay | Admitting: "Endocrinology

## 2019-07-02 ENCOUNTER — Other Ambulatory Visit: Payer: Self-pay

## 2019-07-14 ENCOUNTER — Ambulatory Visit: Payer: Medicare Other | Admitting: "Endocrinology

## 2019-07-17 DIAGNOSIS — E1065 Type 1 diabetes mellitus with hyperglycemia: Secondary | ICD-10-CM | POA: Diagnosis not present

## 2019-07-17 DIAGNOSIS — E108 Type 1 diabetes mellitus with unspecified complications: Secondary | ICD-10-CM | POA: Diagnosis not present

## 2019-07-18 LAB — COMPLETE METABOLIC PANEL WITH GFR
AG Ratio: 1.7 (calc) (ref 1.0–2.5)
ALT: 182 U/L — ABNORMAL HIGH (ref 9–46)
AST: 115 U/L — ABNORMAL HIGH (ref 10–40)
Albumin: 4.2 g/dL (ref 3.6–5.1)
Alkaline phosphatase (APISO): 151 U/L — ABNORMAL HIGH (ref 36–130)
BUN: 8 mg/dL (ref 7–25)
CO2: 25 mmol/L (ref 20–32)
Calcium: 9.3 mg/dL (ref 8.6–10.3)
Chloride: 101 mmol/L (ref 98–110)
Creat: 0.89 mg/dL (ref 0.60–1.35)
GFR, Est African American: 116 mL/min/{1.73_m2} (ref >=60)
GFR, Est Non African American: 100 mL/min/{1.73_m2} (ref >=60)
Globulin: 2.5 g/dL (calc) (ref 1.9–3.7)
Glucose, Bld: 249 mg/dL — ABNORMAL HIGH (ref 65–139)
Potassium: 3.2 mmol/L — ABNORMAL LOW (ref 3.5–5.3)
Sodium: 134 mmol/L — ABNORMAL LOW (ref 135–146)
Total Bilirubin: 1.6 mg/dL — ABNORMAL HIGH (ref 0.2–1.2)
Total Protein: 6.7 g/dL (ref 6.1–8.1)

## 2019-07-18 LAB — HEMOGLOBIN A1C
Hgb A1c MFr Bld: 7 % of total Hgb — ABNORMAL HIGH (ref <5.7)
Mean Plasma Glucose: 154 (calc)
eAG (mmol/L): 8.5 (calc)

## 2019-07-21 ENCOUNTER — Encounter: Payer: Self-pay | Admitting: "Endocrinology

## 2019-07-21 ENCOUNTER — Other Ambulatory Visit: Payer: Self-pay

## 2019-07-21 ENCOUNTER — Ambulatory Visit (INDEPENDENT_AMBULATORY_CARE_PROVIDER_SITE_OTHER): Payer: Medicare Other | Admitting: "Endocrinology

## 2019-07-21 DIAGNOSIS — IMO0002 Reserved for concepts with insufficient information to code with codable children: Secondary | ICD-10-CM

## 2019-07-21 DIAGNOSIS — I10 Essential (primary) hypertension: Secondary | ICD-10-CM | POA: Diagnosis not present

## 2019-07-21 DIAGNOSIS — K8681 Exocrine pancreatic insufficiency: Secondary | ICD-10-CM

## 2019-07-21 DIAGNOSIS — E108 Type 1 diabetes mellitus with unspecified complications: Secondary | ICD-10-CM | POA: Diagnosis not present

## 2019-07-21 DIAGNOSIS — E1065 Type 1 diabetes mellitus with hyperglycemia: Secondary | ICD-10-CM | POA: Diagnosis not present

## 2019-07-21 MED ORDER — INSULIN LISPRO PROT & LISPRO (75-25 MIX) 100 UNIT/ML KWIKPEN: 90.0000 [IU] | PEN_INJECTOR | Freq: Two times a day (BID) | SUBCUTANEOUS | 1 refills | Status: DC
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
## 2019-07-31 DIAGNOSIS — E1142 Type 2 diabetes mellitus with diabetic polyneuropathy: Secondary | ICD-10-CM | POA: Diagnosis not present

## 2019-08-21 DIAGNOSIS — E1142 Type 2 diabetes mellitus with diabetic polyneuropathy: Secondary | ICD-10-CM | POA: Diagnosis not present

## 2019-08-21 DIAGNOSIS — B351 Tinea unguium: Secondary | ICD-10-CM | POA: Diagnosis not present

## 2019-08-24 ENCOUNTER — Other Ambulatory Visit: Payer: Self-pay | Admitting: "Endocrinology

## 2019-08-31 ENCOUNTER — Ambulatory Visit
Admission: EM | Admit: 2019-08-31 | Discharge: 2019-08-31 | Disposition: A | Discharge status: Home / Self Care | Payer: Medicare Other | Attending: Emergency Medicine | Admitting: Emergency Medicine

## 2019-08-31 ENCOUNTER — Other Ambulatory Visit: Payer: Self-pay

## 2019-08-31 DIAGNOSIS — T23122A Burn of first degree of single left finger (nail) except thumb, initial encounter: Secondary | ICD-10-CM

## 2019-08-31 MED ORDER — MUPIROCIN CALCIUM 2 % EX CREA: 1.0000 "application " | TOPICAL_CREAM | Freq: Two times a day (BID) | CUTANEOUS | 0 refills | Status: DC

## 2019-08-31 MED ORDER — IBUPROFEN 600 MG PO TABS: 600.0000 mg | ORAL_TABLET | Freq: Four times a day (QID) | ORAL | 0 refills | Status: DC | PRN

## 2019-08-31 MED ORDER — SULFAMETHOXAZOLE-TRIMETHOPRIM 800-160 MG PO TABS: 1.0000 | ORAL_TABLET | Freq: Two times a day (BID) | ORAL | 0 refills | Status: AC

## 2019-08-31 NOTE — Discharge Instructions (Addendum)
Advised patient to take antibiotic as prescribed Apply Bactroban ointment as prescribed To return for worsening symptoms such as chills, fever, green-white pus,  to follow-up with primary care or go to ED
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
## 2019-09-30 ENCOUNTER — Other Ambulatory Visit: Payer: Self-pay | Admitting: "Endocrinology

## 2019-10-22 ENCOUNTER — Encounter: Payer: Self-pay | Admitting: "Endocrinology

## 2019-10-22 ENCOUNTER — Ambulatory Visit (INDEPENDENT_AMBULATORY_CARE_PROVIDER_SITE_OTHER): Payer: Medicare Other | Admitting: "Endocrinology

## 2019-10-22 ENCOUNTER — Other Ambulatory Visit: Payer: Self-pay

## 2019-10-22 VITALS — BP 116/82 | HR 92 | Ht 73.0 in | Wt 133.8 lb

## 2019-10-22 DIAGNOSIS — IMO0002 Reserved for concepts with insufficient information to code with codable children: Secondary | ICD-10-CM

## 2019-10-22 DIAGNOSIS — E108 Type 1 diabetes mellitus with unspecified complications: Secondary | ICD-10-CM | POA: Diagnosis not present

## 2019-10-22 DIAGNOSIS — I10 Essential (primary) hypertension: Secondary | ICD-10-CM

## 2019-10-22 DIAGNOSIS — F1010 Alcohol abuse, uncomplicated: Secondary | ICD-10-CM | POA: Diagnosis not present

## 2019-10-22 DIAGNOSIS — K8681 Exocrine pancreatic insufficiency: Secondary | ICD-10-CM | POA: Diagnosis not present

## 2019-10-22 DIAGNOSIS — E1065 Type 1 diabetes mellitus with hyperglycemia: Secondary | ICD-10-CM | POA: Diagnosis not present

## 2019-10-22 LAB — POCT GLYCOSYLATED HEMOGLOBIN (HGB A1C): Hemoglobin A1C: 9.4 % — AB (ref 4.0–5.6)

## 2019-10-22 MED ORDER — NOVOLOG MIX 70/30 FLEXPEN (70-30) 100 UNIT/ML ~~LOC~~ SUPN: PEN_INJECTOR | SUBCUTANEOUS | 2 refills | Status: DC

## 2019-10-22 MED ORDER — PANCRELIPASE (LIP-PROT-AMYL) 36000-114000 UNITS PO CPEP: ORAL_CAPSULE | ORAL | 0 refills | Status: DC
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
## 2019-10-30 DIAGNOSIS — E1142 Type 2 diabetes mellitus with diabetic polyneuropathy: Secondary | ICD-10-CM | POA: Diagnosis not present

## 2019-10-30 DIAGNOSIS — B351 Tinea unguium: Secondary | ICD-10-CM | POA: Diagnosis not present

## 2020-01-08 ENCOUNTER — Other Ambulatory Visit: Payer: Self-pay | Admitting: "Endocrinology

## 2020-01-08 DIAGNOSIS — B351 Tinea unguium: Secondary | ICD-10-CM | POA: Diagnosis not present

## 2020-01-08 DIAGNOSIS — E1142 Type 2 diabetes mellitus with diabetic polyneuropathy: Secondary | ICD-10-CM | POA: Diagnosis not present

## 2020-02-05 ENCOUNTER — Encounter (HOSPITAL_COMMUNITY): Payer: Self-pay | Admitting: *Deleted

## 2020-02-05 ENCOUNTER — Other Ambulatory Visit: Payer: Self-pay

## 2020-02-05 ENCOUNTER — Inpatient Hospital Stay (HOSPITAL_COMMUNITY)
Admission: EM | Admit: 2020-02-05 | Discharge: 2020-02-09 | DRG: 640 | Disposition: A | Discharge status: Home Health | Payer: Medicare Other | Attending: Internal Medicine | Admitting: Internal Medicine

## 2020-02-05 ENCOUNTER — Emergency Department (HOSPITAL_COMMUNITY): Payer: Medicare Other

## 2020-02-05 DIAGNOSIS — Z681 Body mass index (BMI) 19 or less, adult: Secondary | ICD-10-CM

## 2020-02-05 DIAGNOSIS — L97509 Non-pressure chronic ulcer of other part of unspecified foot with unspecified severity: Secondary | ICD-10-CM | POA: Diagnosis present

## 2020-02-05 DIAGNOSIS — R296 Repeated falls: Secondary | ICD-10-CM | POA: Diagnosis present

## 2020-02-05 DIAGNOSIS — M542 Cervicalgia: Secondary | ICD-10-CM | POA: Diagnosis present

## 2020-02-05 DIAGNOSIS — K861 Other chronic pancreatitis: Secondary | ICD-10-CM | POA: Diagnosis present

## 2020-02-05 DIAGNOSIS — E10649 Type 1 diabetes mellitus with hypoglycemia without coma: Secondary | ICD-10-CM | POA: Diagnosis present

## 2020-02-05 DIAGNOSIS — Z794 Long term (current) use of insulin: Secondary | ICD-10-CM

## 2020-02-05 DIAGNOSIS — E43 Unspecified severe protein-calorie malnutrition: Secondary | ICD-10-CM | POA: Diagnosis present

## 2020-02-05 DIAGNOSIS — F1010 Alcohol abuse, uncomplicated: Secondary | ICD-10-CM | POA: Diagnosis present

## 2020-02-05 DIAGNOSIS — K529 Noninfective gastroenteritis and colitis, unspecified: Secondary | ICD-10-CM | POA: Diagnosis present

## 2020-02-05 DIAGNOSIS — R29898 Other symptoms and signs involving the musculoskeletal system: Secondary | ICD-10-CM

## 2020-02-05 DIAGNOSIS — K7460 Unspecified cirrhosis of liver: Secondary | ICD-10-CM | POA: Diagnosis present

## 2020-02-05 DIAGNOSIS — R634 Abnormal weight loss: Secondary | ICD-10-CM | POA: Diagnosis present

## 2020-02-05 DIAGNOSIS — G8929 Other chronic pain: Secondary | ICD-10-CM | POA: Diagnosis present

## 2020-02-05 DIAGNOSIS — I10 Essential (primary) hypertension: Secondary | ICD-10-CM | POA: Diagnosis present

## 2020-02-05 DIAGNOSIS — Z20822 Contact with and (suspected) exposure to covid-19: Secondary | ICD-10-CM | POA: Diagnosis present

## 2020-02-05 DIAGNOSIS — K219 Gastro-esophageal reflux disease without esophagitis: Secondary | ICD-10-CM | POA: Diagnosis present

## 2020-02-05 DIAGNOSIS — E1065 Type 1 diabetes mellitus with hyperglycemia: Secondary | ICD-10-CM | POA: Diagnosis present

## 2020-02-05 DIAGNOSIS — M6250 Muscle wasting and atrophy, not elsewhere classified, unspecified site: Secondary | ICD-10-CM | POA: Diagnosis present

## 2020-02-05 DIAGNOSIS — Z23 Encounter for immunization: Secondary | ICD-10-CM | POA: Diagnosis present

## 2020-02-05 DIAGNOSIS — F17200 Nicotine dependence, unspecified, uncomplicated: Secondary | ICD-10-CM | POA: Diagnosis present

## 2020-02-05 DIAGNOSIS — E876 Hypokalemia: Secondary | ICD-10-CM

## 2020-02-05 DIAGNOSIS — E10621 Type 1 diabetes mellitus with foot ulcer: Secondary | ICD-10-CM | POA: Diagnosis present

## 2020-02-05 DIAGNOSIS — R197 Diarrhea, unspecified: Secondary | ICD-10-CM

## 2020-02-05 DIAGNOSIS — Z833 Family history of diabetes mellitus: Secondary | ICD-10-CM

## 2020-02-05 DIAGNOSIS — Z79899 Other long term (current) drug therapy: Secondary | ICD-10-CM

## 2020-02-05 DIAGNOSIS — R531 Weakness: Secondary | ICD-10-CM

## 2020-02-05 LAB — CBC
HCT: 32 % — ABNORMAL LOW (ref 39.0–52.0)
Hemoglobin: 11.6 g/dL — ABNORMAL LOW (ref 13.0–17.0)
MCH: 34.4 pg — ABNORMAL HIGH (ref 26.0–34.0)
MCHC: 36.3 g/dL — ABNORMAL HIGH (ref 30.0–36.0)
MCV: 95 fL (ref 80.0–100.0)
Platelets: 111 10*3/uL — ABNORMAL LOW (ref 150–400)
RBC: 3.37 MIL/uL — ABNORMAL LOW (ref 4.22–5.81)
RDW: 12.5 % (ref 11.5–15.5)
WBC: 12.2 10*3/uL — ABNORMAL HIGH (ref 4.0–10.5)
nRBC: 0 % (ref 0.0–0.2)

## 2020-02-05 LAB — CBC WITH DIFFERENTIAL/PLATELET
Abs Immature Granulocytes: 0.05 10*3/uL (ref 0.00–0.07)
Basophils Absolute: 0 10*3/uL (ref 0.0–0.1)
Basophils Relative: 0 %
Eosinophils Absolute: 0 10*3/uL (ref 0.0–0.5)
Eosinophils Relative: 0 %
HCT: 35.5 % — ABNORMAL LOW (ref 39.0–52.0)
Hemoglobin: 12.9 g/dL — ABNORMAL LOW (ref 13.0–17.0)
Immature Granulocytes: 1 %
Lymphocytes Relative: 15 %
Lymphs Abs: 1.5 10*3/uL (ref 0.7–4.0)
MCH: 34.9 pg — ABNORMAL HIGH (ref 26.0–34.0)
MCHC: 36.3 g/dL — ABNORMAL HIGH (ref 30.0–36.0)
MCV: 95.9 fL (ref 80.0–100.0)
Monocytes Absolute: 0.9 10*3/uL (ref 0.1–1.0)
Monocytes Relative: 9 %
Neutro Abs: 7.7 10*3/uL (ref 1.7–7.7)
Neutrophils Relative %: 75 %
Platelets: 123 10*3/uL — ABNORMAL LOW (ref 150–400)
RBC: 3.7 MIL/uL — ABNORMAL LOW (ref 4.22–5.81)
RDW: 12.6 % (ref 11.5–15.5)
WBC: 10.2 10*3/uL (ref 4.0–10.5)
nRBC: 0 % (ref 0.0–0.2)

## 2020-02-05 LAB — PHOSPHORUS: Phosphorus: <1 mg/dL — CL (ref 2.5–4.6)

## 2020-02-05 LAB — COMPREHENSIVE METABOLIC PANEL
ALT: 44 U/L (ref 0–44)
ALT: 43 U/L (ref 0–44)
AST: 29 U/L (ref 15–41)
AST: 36 U/L (ref 15–41)
Albumin: 3.5 g/dL (ref 3.5–5.0)
Albumin: 3.4 g/dL — ABNORMAL LOW (ref 3.5–5.0)
Alkaline Phosphatase: 134 U/L — ABNORMAL HIGH (ref 38–126)
Alkaline Phosphatase: 126 U/L (ref 38–126)
Anion gap: 11 (ref 5–15)
Anion gap: 11 (ref 5–15)
BUN: 20 mg/dL (ref 6–20)
BUN: 20 mg/dL (ref 6–20)
CO2: 23 mmol/L (ref 22–32)
CO2: 22 mmol/L (ref 22–32)
Calcium: 9 mg/dL (ref 8.9–10.3)
Calcium: 8.6 mg/dL — ABNORMAL LOW (ref 8.9–10.3)
Chloride: 97 mmol/L — ABNORMAL LOW (ref 98–111)
Chloride: 96 mmol/L — ABNORMAL LOW (ref 98–111)
Creatinine, Ser: 1.06 mg/dL (ref 0.61–1.24)
Creatinine, Ser: 0.95 mg/dL (ref 0.61–1.24)
GFR calc Af Amer: >60 mL/min (ref >60)
GFR calc Af Amer: >60 mL/min (ref >60)
GFR calc non Af Amer: >60 mL/min (ref >60)
GFR calc non Af Amer: >60 mL/min (ref >60)
Glucose, Bld: 416 mg/dL — ABNORMAL HIGH (ref 70–99)
Glucose, Bld: 317 mg/dL — ABNORMAL HIGH (ref 70–99)
Potassium: <2 mmol/L — CL (ref 3.5–5.1)
Potassium: <2 mmol/L — CL (ref 3.5–5.1)
Sodium: 131 mmol/L — ABNORMAL LOW (ref 135–145)
Sodium: 129 mmol/L — ABNORMAL LOW (ref 135–145)
Total Bilirubin: 1.2 mg/dL (ref 0.3–1.2)
Total Bilirubin: 1.2 mg/dL (ref 0.3–1.2)
Total Protein: 7.2 g/dL (ref 6.5–8.1)
Total Protein: 6.7 g/dL (ref 6.5–8.1)

## 2020-02-05 LAB — URINALYSIS, ROUTINE W REFLEX MICROSCOPIC
Bilirubin Urine: NEGATIVE
Glucose, UA: NEGATIVE mg/dL
Ketones, ur: 5 mg/dL — AB
Nitrite: NEGATIVE
Protein, ur: 100 mg/dL — AB
Specific Gravity, Urine: 1.011 (ref 1.005–1.030)
WBC, UA: >50 WBC/hpf — ABNORMAL HIGH (ref 0–5)
pH: 6 (ref 5.0–8.0)

## 2020-02-05 LAB — POTASSIUM: Potassium: 2 mmol/L — CL (ref 3.5–5.1)

## 2020-02-05 LAB — PROTIME-INR
INR: 1 (ref 0.8–1.2)
Prothrombin Time: 13.2 seconds (ref 11.4–15.2)

## 2020-02-05 LAB — GLUCOSE, CAPILLARY: Glucose-Capillary: 288 mg/dL — ABNORMAL HIGH (ref 70–99)

## 2020-02-05 LAB — SARS CORONAVIRUS 2 BY RT PCR (HOSPITAL ORDER, PERFORMED IN ~~LOC~~ HOSPITAL LAB): SARS Coronavirus 2: NEGATIVE

## 2020-02-05 LAB — MAGNESIUM: Magnesium: 2.2 mg/dL (ref 1.7–2.4)

## 2020-02-05 LAB — CBG MONITORING, ED: Glucose-Capillary: 393 mg/dL — ABNORMAL HIGH (ref 70–99)

## 2020-02-05 MED ORDER — POTASSIUM CHLORIDE CRYS ER 20 MEQ PO TBCR
40.0000 meq | EXTENDED_RELEASE_TABLET | Freq: Two times a day (BID) | ORAL | Status: DC
  Filled 2020-02-05: qty 2

## 2020-02-05 MED ORDER — POTASSIUM CHLORIDE 10 MEQ/100ML IV SOLN
10.0000 meq | INTRAVENOUS | Status: AC
  Administered 2020-02-05 – 2020-02-06 (×5): 10 meq via INTRAVENOUS
  Filled 2020-02-05 (×5): qty 100

## 2020-02-05 MED ORDER — ONDANSETRON HCL 4 MG/2ML IJ SOLN: 4.0000 mg | Freq: Four times a day (QID) | INTRAMUSCULAR | Status: DC | PRN

## 2020-02-05 MED ORDER — POTASSIUM PHOSPHATES 15 MMOLE/5ML IV SOLN
30.0000 mmol | Freq: Once | INTRAVENOUS | Status: AC
  Administered 2020-02-06: 30 mmol via INTRAVENOUS
  Filled 2020-02-05: qty 10

## 2020-02-05 MED ORDER — ENOXAPARIN SODIUM 40 MG/0.4ML ~~LOC~~ SOLN
40.0000 mg | SUBCUTANEOUS | Status: DC
  Administered 2020-02-05 – 2020-02-08 (×4): 40 mg via SUBCUTANEOUS
  Filled 2020-02-05 (×4): qty 0.4

## 2020-02-05 MED ORDER — INSULIN ASPART 100 UNIT/ML ~~LOC~~ SOLN: 6.0000 [IU] | Freq: Three times a day (TID) | SUBCUTANEOUS | Status: DC

## 2020-02-05 MED ORDER — POTASSIUM CHLORIDE 10 MEQ/100ML IV SOLN
10.0000 meq | INTRAVENOUS | Status: AC
  Administered 2020-02-05 (×3): 10 meq via INTRAVENOUS
  Filled 2020-02-05 (×3): qty 100

## 2020-02-05 MED ORDER — FOLIC ACID 1 MG PO TABS
1.0000 mg | ORAL_TABLET | Freq: Every day | ORAL | Status: DC
  Administered 2020-02-05 – 2020-02-09 (×5): 1 mg via ORAL
  Filled 2020-02-05 (×5): qty 1

## 2020-02-05 MED ORDER — INSULIN ASPART 100 UNIT/ML ~~LOC~~ SOLN: 0.0000 [IU] | Freq: Three times a day (TID) | SUBCUTANEOUS | Status: DC

## 2020-02-05 MED ORDER — SODIUM CHLORIDE 0.9 % IV SOLN
INTRAVENOUS | Status: DC | PRN
  Administered 2020-02-05: 500 mL via INTRAVENOUS

## 2020-02-05 MED ORDER — ONDANSETRON HCL 4 MG PO TABS: 4.0000 mg | ORAL_TABLET | Freq: Four times a day (QID) | ORAL | Status: DC | PRN

## 2020-02-05 MED ORDER — INSULIN DETEMIR 100 UNIT/ML ~~LOC~~ SOLN
40.0000 [IU] | Freq: Two times a day (BID) | SUBCUTANEOUS | Status: DC
  Administered 2020-02-05: 40 [IU] via SUBCUTANEOUS
  Filled 2020-02-05 (×6): qty 0.4

## 2020-02-05 MED ORDER — THIAMINE HCL 100 MG/ML IJ SOLN
100.0000 mg | Freq: Every day | INTRAMUSCULAR | Status: DC
  Filled 2020-02-05: qty 2

## 2020-02-05 MED ORDER — PANCRELIPASE (LIP-PROT-AMYL) 12000-38000 UNITS PO CPEP
36000.0000 [IU] | ORAL_CAPSULE | Freq: Three times a day (TID) | ORAL | Status: DC
  Administered 2020-02-06 – 2020-02-09 (×10): 36000 [IU] via ORAL
  Filled 2020-02-05 (×10): qty 3

## 2020-02-05 MED ORDER — THIAMINE HCL 100 MG/ML IJ SOLN
100.0000 mg | Freq: Once | INTRAMUSCULAR | Status: AC
  Administered 2020-02-05: 100 mg via INTRAVENOUS
  Filled 2020-02-05: qty 2

## 2020-02-05 MED ORDER — LORAZEPAM 2 MG/ML IJ SOLN: 1.0000 mg | INTRAMUSCULAR | Status: AC | PRN

## 2020-02-05 MED ORDER — ENSURE ENLIVE PO LIQD
237.0000 mL | Freq: Two times a day (BID) | ORAL | Status: DC
  Administered 2020-02-06 – 2020-02-08 (×6): 237 mL via ORAL

## 2020-02-05 MED ORDER — FOLIC ACID 5 MG/ML IJ SOLN
1.0000 mg | Freq: Once | INTRAMUSCULAR | Status: AC
  Administered 2020-02-05: 1 mg via INTRAVENOUS
  Filled 2020-02-05: qty 0.2

## 2020-02-05 MED ORDER — ADULT MULTIVITAMIN W/MINERALS CH
1.0000 | ORAL_TABLET | Freq: Every day | ORAL | Status: DC
  Administered 2020-02-05 – 2020-02-09 (×5): 1 via ORAL
  Filled 2020-02-05 (×5): qty 1

## 2020-02-05 MED ORDER — THIAMINE HCL 100 MG PO TABS
100.0000 mg | ORAL_TABLET | Freq: Every day | ORAL | Status: DC
  Administered 2020-02-05 – 2020-02-09 (×5): 100 mg via ORAL
  Filled 2020-02-05 (×5): qty 1

## 2020-02-05 MED ORDER — INSULIN ASPART 100 UNIT/ML ~~LOC~~ SOLN
0.0000 [IU] | Freq: Every day | SUBCUTANEOUS | Status: DC
  Administered 2020-02-05: 3 [IU] via SUBCUTANEOUS

## 2020-02-05 MED ORDER — POTASSIUM CHLORIDE CRYS ER 20 MEQ PO TBCR
60.0000 meq | EXTENDED_RELEASE_TABLET | Freq: Once | ORAL | Status: AC
  Administered 2020-02-05: 60 meq via ORAL
  Filled 2020-02-05: qty 3

## 2020-02-05 MED ORDER — LOPERAMIDE HCL 2 MG PO CAPS: 2.0000 mg | ORAL_CAPSULE | ORAL | Status: DC | PRN

## 2020-02-05 MED ORDER — PNEUMOCOCCAL VAC POLYVALENT 25 MCG/0.5ML IJ INJ
0.5000 mL | INJECTION | INTRAMUSCULAR | Status: AC
  Administered 2020-02-09: 0.5 mL via INTRAMUSCULAR
  Filled 2020-02-05: qty 0.5

## 2020-02-05 MED ORDER — LORAZEPAM 1 MG PO TABS: 1.0000 mg | ORAL_TABLET | ORAL | Status: AC | PRN

## 2020-02-05 NOTE — ED Notes (Signed)
Date and time results received: 02/05/20 1625 (use smartphrase ".now" to insert current time)  Test: potassium Critical Value: <2.0  Name of Provider Notified: Harris,PA  Orders Received? Or Actions Taken?:
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
## 2020-02-05 NOTE — ED Triage Notes (Signed)
Weakness of hands for the past 2 weeks, also c/o dizziness and wt loss.

## 2020-02-05 NOTE — Progress Notes (Signed)
CRITICAL VALUE ALERT  Critical Value:  <1.0 phosphorus  Date & Time Notied:  02/05/2020 @ 2220  Provider Notified: Welton Flakes  Orders Received/Actions taken: awaiting orders
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
## 2020-02-06 ENCOUNTER — Encounter (HOSPITAL_COMMUNITY): Payer: Self-pay | Admitting: Family Medicine

## 2020-02-06 DIAGNOSIS — E876 Hypokalemia: Principal | ICD-10-CM

## 2020-02-06 LAB — RAPID URINE DRUG SCREEN, HOSP PERFORMED
Amphetamines: NOT DETECTED
Barbiturates: NOT DETECTED
Benzodiazepines: NOT DETECTED
Cocaine: NOT DETECTED
Opiates: NOT DETECTED
Tetrahydrocannabinol: NOT DETECTED

## 2020-02-06 LAB — GLUCOSE, CAPILLARY
Glucose-Capillary: 100 mg/dL — ABNORMAL HIGH (ref 70–99)
Glucose-Capillary: 104 mg/dL — ABNORMAL HIGH (ref 70–99)
Glucose-Capillary: 150 mg/dL — ABNORMAL HIGH (ref 70–99)
Glucose-Capillary: 206 mg/dL — ABNORMAL HIGH (ref 70–99)
Glucose-Capillary: 236 mg/dL — ABNORMAL HIGH (ref 70–99)
Glucose-Capillary: 261 mg/dL — ABNORMAL HIGH (ref 70–99)
Glucose-Capillary: 280 mg/dL — ABNORMAL HIGH (ref 70–99)
Glucose-Capillary: 31 mg/dL — CL (ref 70–99)
Glucose-Capillary: 49 mg/dL — ABNORMAL LOW (ref 70–99)

## 2020-02-06 LAB — CBC
HCT: 33.2 % — ABNORMAL LOW (ref 39.0–52.0)
Hemoglobin: 12 g/dL — ABNORMAL LOW (ref 13.0–17.0)
MCH: 33.8 pg (ref 26.0–34.0)
MCHC: 36.1 g/dL — ABNORMAL HIGH (ref 30.0–36.0)
MCV: 93.5 fL (ref 80.0–100.0)
Platelets: 86 10*3/uL — ABNORMAL LOW (ref 150–400)
RBC: 3.55 MIL/uL — ABNORMAL LOW (ref 4.22–5.81)
RDW: 12.3 % (ref 11.5–15.5)
WBC: 4.4 10*3/uL (ref 4.0–10.5)
nRBC: 0 % (ref 0.0–0.2)

## 2020-02-06 LAB — HEMOGLOBIN A1C
Hgb A1c MFr Bld: 10.9 % — ABNORMAL HIGH (ref 4.8–5.6)
Mean Plasma Glucose: 266.13 mg/dL

## 2020-02-06 LAB — VITAMIN B12: Vitamin B-12: 547 pg/mL (ref 180–914)

## 2020-02-06 LAB — BASIC METABOLIC PANEL
Anion gap: 10 (ref 5–15)
BUN: 19 mg/dL (ref 6–20)
CO2: 20 mmol/L — ABNORMAL LOW (ref 22–32)
Calcium: 8.6 mg/dL — ABNORMAL LOW (ref 8.9–10.3)
Chloride: 101 mmol/L (ref 98–111)
Creatinine, Ser: 0.84 mg/dL (ref 0.61–1.24)
GFR calc Af Amer: >60 mL/min (ref >60)
GFR calc non Af Amer: >60 mL/min (ref >60)
Glucose, Bld: 32 mg/dL — CL (ref 70–99)
Potassium: <2 mmol/L — CL (ref 3.5–5.1)
Sodium: 131 mmol/L — ABNORMAL LOW (ref 135–145)

## 2020-02-06 LAB — POTASSIUM: Potassium: 2.2 mmol/L — CL (ref 3.5–5.1)

## 2020-02-06 LAB — TSH: TSH: 2.674 u[IU]/mL (ref 0.350–4.500)

## 2020-02-06 LAB — FOLATE: Folate: 59.1 ng/mL (ref >5.9)

## 2020-02-06 LAB — MAGNESIUM: Magnesium: 1.8 mg/dL (ref 1.7–2.4)

## 2020-02-06 LAB — HIV ANTIBODY (ROUTINE TESTING W REFLEX): HIV Screen 4th Generation wRfx: NONREACTIVE

## 2020-02-06 MED ORDER — DEXTROSE 50 % IV SOLN
INTRAVENOUS | Status: AC
  Filled 2020-02-06: qty 50

## 2020-02-06 MED ORDER — KCL IN DEXTROSE-NACL 40-5-0.9 MEQ/L-%-% IV SOLN: INTRAVENOUS | Status: DC

## 2020-02-06 MED ORDER — DEXTROSE 50 % IV SOLN
INTRAVENOUS | Status: AC
  Administered 2020-02-06: 25 mL
  Filled 2020-02-06: qty 50

## 2020-02-06 MED ORDER — DEXTROSE 50 % IV SOLN
INTRAVENOUS | Status: AC
  Administered 2020-02-07: 50 mL
  Filled 2020-02-06: qty 50

## 2020-02-06 MED ORDER — TRAMADOL HCL 50 MG PO TABS
50.0000 mg | ORAL_TABLET | Freq: Once | ORAL | Status: AC
  Administered 2020-02-06: 50 mg via ORAL
  Filled 2020-02-06: qty 1

## 2020-02-06 MED ORDER — INSULIN ASPART 100 UNIT/ML ~~LOC~~ SOLN
0.0000 [IU] | Freq: Three times a day (TID) | SUBCUTANEOUS | Status: DC
Start: 2020-02-06 — End: 2020-02-06

## 2020-02-06 MED ORDER — POTASSIUM CHLORIDE CRYS ER 20 MEQ PO TBCR
40.0000 meq | EXTENDED_RELEASE_TABLET | Freq: Three times a day (TID) | ORAL | Status: DC
  Administered 2020-02-06 – 2020-02-08 (×9): 40 meq via ORAL
  Filled 2020-02-06 (×6): qty 2
  Filled 2020-02-06: qty 4
  Filled 2020-02-06 (×2): qty 2

## 2020-02-06 MED ORDER — INSULIN ASPART 100 UNIT/ML ~~LOC~~ SOLN
0.0000 [IU] | SUBCUTANEOUS | Status: DC
  Administered 2020-02-06: 5 [IU] via SUBCUTANEOUS
  Administered 2020-02-07: 7 [IU] via SUBCUTANEOUS

## 2020-02-06 NOTE — Progress Notes (Signed)
Inpatient Diabetes Program Recommendations  AACE/ADA: New Consensus Statement on Inpatient Glycemic Control (2015)  Target Ranges:  Prepandial:   less than 140 mg/dL      Peak postprandial:   less than 180 mg/dL (1-2 hours)      Critically ill patients:  140 - 180 mg/dL   Lab Results  Component Value Date   GLUCAP 100 (H) 02/06/2020   HGBA1C 9.4 (A) 10/22/2019    Review of Glycemic Control Results for TAIYO, KOZMA (MRN [id number]) as of 02/06/2020 10:27  Ref. Range 02/06/2020 06:12 02/06/2020 06:14 02/06/2020 06:31 02/06/2020 07:23  Glucose-Capillary Latest Ref Range: 70 - 99 mg/dL 32 (LL) 31 (LL) 459 (H) 100 (H)   Diabetes history: DM 1 Outpatient Diabetes medications:  Humalog 75/25 mix 100 units bid Current orders for Inpatient glycemic control:  Novolog sensitive q 4 hours  Inpatient Diabetes Program Recommendations:    Note hypoglycemia this AM.  Agree with the addition of Dextrose to IV fluids.  Also agree with Novolog sensitive q 4 hours.  May need basal restarted once CBG>180 mg/dL consistently.   Thanks  Beryl Meager, RN, BC-ADM Inpatient Diabetes Coordinator Pager [phone] (8a-5p)

## 2020-02-06 NOTE — Progress Notes (Signed)
Initial Nutrition Assessment  DOCUMENTATION CODES:   Underweight(Suspect severe PCM)  INTERVENTION:  Continue Ensure Enlive po BID, each supplement provides 350 kcal and 20 grams of protein  MVI with minerals daily  Continue to monitor magnesium, potassium, and phosphorus daily for at least 3 days, MD to replete as needed, as pt is at high risk for refeeding syndrome given severe hypokalemia, magnesium trending down, 5/21 phosphorous <1  per labs.  NUTRITION DIAGNOSIS:   Unintentional weight loss related to chronic illness(poorly controlled IDDM) as evidenced by percent weight loss.    GOAL:   Weight gain, Patient will meet greater than or equal to 90% of their needs    MONITOR:   PO intake, Labs, I & O's, Supplement acceptance, Weight trends  REASON FOR ASSESSMENT:   Malnutrition Screening Tool    ASSESSMENT:  RD working remotely.  49 year old male admitted with hypokalemia after presenting with ongoing weakness, worsening numbness in hands, chronic diarrhea, over the past couple of months and reports unintentional wt loss of 80 lbs over the past couple of years. 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  RD attempted to contact pt via phone this morning, however pt did not pick up, unable to obtain nutrition history at this time. Per flowsheets, pt consumed 50% of breakfast this morning. Per protocol, he is provided Ensure Enlive BID and is accepting of supplement this morning per medication review. Will continue this and monitor for po intake of meals.  Current wt 108.9 lb Per history, on 10/22/19 he weighed 133.54 lb, indicating 24.64 lb (18.5%) wt loss in 3.5 months which is significant. Stable 143-146 lb from 09/2017 - 02/2019. Patient is severely underweight and given  significant wt loss, highly suspect malnutrition, however unable to identify at this  time.   Per notes: -outpt neurology referral for EMG testing -GI evaluation inpt vs oupt depending on clinical course -withdrawal protocol  I/Os: +790 ml since admit UOP: 400 ml since admit Medications reviewed and include: Folic acid, SSI, Creon, MVI, Klor-con, Thiamine IVF: D5 NaCl with KCl 40 mEq @ 100 ml/hr Labs: CBGs 100,206,31,32,288,280 since admit, Na 131, K 2.0 (L) Mg 1.8 (WNL) trending down 5/21 P <1.0   NUTRITION - FOCUSED PHYSICAL EXAM: Unable to complete at this time, RD working remotely.  Diet Order:   Diet Order            Diet heart healthy/carb modified Room service appropriate? Yes; Fluid consistency: Thin  Diet effective now              EDUCATION NEEDS:   Not appropriate for education at this time  Skin:  Skin Assessment: Skin Integrity Issues: Skin Integrity Issues:: Other (Comment) Other: Amputation; L foot  Last BM:  5/21  Height:   Ht Readings from Last 1 Encounters:  02/05/20 6\' 1"  (1.854 m)    Weight:   Wt Readings from Last 1 Encounters:  02/05/20 49.5 kg    Ideal Body Weight:  82.1 kg(Adjusted IBW for L foot amputation)  BMI:  Body mass index is 14.4 kg/m.  Estimated Nutritional Needs:   Kcal:  1733-1980 (35-40 kcal/kg)  Protein:  74-87  Fluid:  >/= 1.4 L   02/07/20, RD, LDN Clinical Nutrition After Hours/Weekend Pager # in Amion

## 2020-02-06 NOTE — Progress Notes (Signed)
PROGRESS NOTE    [name]  TGG:[id number] DOB: 13-Jan-1971 DOA: 02/05/2020 PCP: Patient, No Pcp Per   Brief Narrative:  Per HPI: [name] is a 49 y.o. male with a history of alcoholism, cirrhosis, GERD, diabetes with history of poorly controlled insulin-dependent diabetic ulcer and amputation of left forefoot, hypertension, chronic pancreatitis.  Patient seen for weakness that has been ongoing for months accompanied numbness in his hands left side has been worse, but today his right hand became extremely weak.  He is unable to grasp or hold anything, and unable to check his sugars at home.  He has an unintentional weight loss of 80 pounds over the past couple of years, and is unable to say how much weight he has lost over the past couple of months.  He has chronic diarrhea that has been ongoing with no palliating or provoking factors.    He does drink alcohol.  Used to drink approximately 140 ounces of liquor, but is now down to 4 ounces daily.  He does not get tremors with withdrawal.  -Patient has been admitted with weakness and muscle wasting in the setting of what appears to be poor type 1 diabetes control along with recent persistent diarrhea which has led to severe hypokalemia.  He also has significant history of alcohol abuse.  He continues to require ongoing aggressive potassium supplementation as well as IV dextrose with his hypoglycemia.  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)   Weakness   Diarrhea   Muscle wasting   Weakness and weight loss in the setting of ongoing diarrhea -Appears to be mostly multifactorial with ongoing alcohol abuse and severe protein calorie malnutrition along with brittle diabetes -Acutely he is noted to have some significant hypokalemia which needs to  resolve -Serum B12 noted to be 547 and TSH 2.674 -He will require outpatient neurology referral for EMG testing -HIV testing pending -PT/OT evaluation  Severe hypokalemia secondary to diarrhea -Appears to be a chronic problem -Agree with stool PCR and Imodium with no recent antibiotics noted -He will require GI evaluation inpatient versus outpatient depending on clinical course -Continue aggressive supplementation IV and p.o. and monitor repeat labs and magnesium  Ongoing hypoglycemia in the setting of type 1 diabetes -Maintain on sliding scale insulin every 4 hours and hold long-acting and mealtime insulin -D5 infusion initiated  Alcohol abuse history -Withdrawal protocol  Severe protein calorie malnutrition -Dietitian recommendations with nutrition supplementation ordered  Hypertension -Currently with soft blood pressure readings -Monitor closely  DVT prophylaxis: Lovenox Code Status: Full Family Communication: None at bedside, patient states he will call Disposition Plan: Continue ongoing treatment of electrolyte derangement and hypoglycemia.  Status is: Inpatient  Remains inpatient appropriate because:IV treatments appropriate due to intensity of illness or inability to take PO and Inpatient level of care appropriate due to severity of illness   Dispo: The patient is from: Home              Anticipated d/c is to: Home              Anticipated d/c date is: 3 days              Patient currently is not medically stable to d/c.  He continues to have persistent, severe hypokalemia as well as hypoglycemia with  ongoing diarrhea and is not stable for discharge.   Consultants:   None  Procedures:   None  Antimicrobials:   None   Subjective: Patient seen and evaluated today with ongoing diarrhea noted.  He denies any pain.  Objective: Vitals:   02/05/20 2137 02/05/20 2138 02/06/20 0131 02/06/20 0532  BP: 106/81 106/81 100/70 96/61  Pulse: 69 69 72 99  Resp: 19  19 16 16   Temp: (!) 97.5 F (36.4 C) (!) 97.5 F (36.4 C) 98.4 F (36.9 C) 98.3 F (36.8 C)  TempSrc: Oral Oral Oral Oral  SpO2: 100%  100% (!) 85%  Weight:  49.5 kg    Height:  6\' 1"  (1.854 m)      Intake/Output Summary (Last 24 hours) at 02/06/2020 1044 Last data filed at 02/06/2020 0900 Gross per 24 hour  Intake 1430.41 ml  Output 400 ml  Net 1030.41 ml   Filed Weights   02/05/20 2138  Weight: 49.5 kg    Examination:  General exam: Appears calm and comfortable, appears emaciated Respiratory system: Clear to auscultation. Respiratory effort normal. Cardiovascular system: S1 & S2 heard, RRR. No JVD, murmurs, rubs, gallops or clicks. No pedal edema. Gastrointestinal system: Abdomen is nondistended, soft and nontender. No organomegaly or masses felt. Normal bowel sounds heard. Central nervous system: Alert and oriented. No focal neurological deficits. Extremities: Symmetric 5 x 5 power. Skin: No rashes, lesions or ulcers Psychiatry: Judgement and insight appear normal. Mood & affect appropriate.     Data Reviewed: I have personally reviewed following labs and imaging studies  CBC: Recent Labs  Lab 02/05/20 1525 02/05/20 2220 02/06/20 0542  WBC 10.2 12.2* 4.4  NEUTROABS 7.7  --   --   HGB 12.9* 11.6* 12.0*  HCT 35.5* 32.0* 33.2*  MCV 95.9 95.0 93.5  PLT 123* 111* 86*   Basic Metabolic Panel: Recent Labs  Lab 02/05/20 1525 02/05/20 2049 02/05/20 2220 02/06/20 0023 02/06/20 0542  NA 131*  --  129*  --  131*  K <2.0* 2.0* <2.0* 2.2* <2.0*  CL 97*  --  96*  --  101  CO2 23  --  22  --  20*  GLUCOSE 416*  --  317*  --  32*  BUN 20  --  20  --  19  CREATININE 1.06  --  0.95  --  0.84  CALCIUM 9.0  --  8.6*  --  8.6*  MG 2.2  --   --   --  1.8  PHOS  --   --  <1.0*  --   --    GFR: Estimated Creatinine Clearance: 74.5 mL/min (by C-G formula based on SCr of 0.84 mg/dL). Liver Function Tests: Recent Labs  Lab 02/05/20 1525 02/05/20 2220  AST 29 36   ALT 44 43  ALKPHOS 134* 126  BILITOT 1.2 1.2  PROT 7.2 6.7  ALBUMIN 3.5 3.4*   No results for input(s): LIPASE, AMYLASE in the last 168 hours. No results for input(s): AMMONIA in the last 168 hours. Coagulation Profile: Recent Labs  Lab 02/05/20 1525  INR 1.0   Cardiac Enzymes: No results for input(s): CKTOTAL, CKMB, CKMBINDEX, TROPONINI in the last 168 hours. BNP (last 3 results) No results for input(s): PROBNP in the last 8760 hours. HbA1C: No results for input(s): HGBA1C in the last 72 hours. CBG: Recent Labs  Lab 02/05/20 2140 02/06/20 0612 02/06/20 0614 02/06/20 0631 02/06/20 0723  GLUCAP 288* 32* 31* 206* 100*   Lipid  Profile: No results for input(s): CHOL, HDL, LDLCALC, TRIG, CHOLHDL, LDLDIRECT in the last 72 hours. Thyroid Function Tests: Recent Labs    02/06/20 0542  TSH 2.674   Anemia Panel: Recent Labs    02/06/20 0542  VITAMINB12 547  FOLATE 59.1   Sepsis Labs: No results for input(s): PROCALCITON, LATICACIDVEN in the last 168 hours.  Recent Results (from the past 240 hour(s))  SARS Coronavirus 2 by RT PCR (hospital order, performed in [facility] hospital lab) Nasopharyngeal Nasopharyngeal Swab     Status: None   Collection Time: 02/05/20  6:30 PM   Specimen: Nasopharyngeal Swab  Result Value Ref Range Status   SARS Coronavirus 2 NEGATIVE NEGATIVE Final    Comment: (NOTE) SARS-CoV-2 target nucleic acids are NOT DETECTED. The SARS-CoV-2 RNA is generally detectable in upper and lower respiratory specimens during the acute phase of infection. The lowest concentration of SARS-CoV-2 viral copies this assay can detect is 250 copies / mL. A negative result does not preclude SARS-CoV-2 infection and should not be used as the sole basis for treatment or other patient management decisions.  A negative result may occur with improper specimen collection / handling, submission of specimen other than nasopharyngeal swab, presence of viral mutation(s)  within the areas targeted by this assay, and inadequate number of viral copies (<250 copies / mL). A negative result must be combined with clinical observations, patient history, and epidemiological information. Fact Sheet for Patients:   BoilerBrush.com.cy Fact Sheet for Healthcare Providers: https://pope.com/ This test is not yet approved or cleared  by the [place] FDA and has been authorized for detection and/or diagnosis of SARS-CoV-2 by FDA under an Emergency Use Authorization (EUA).  This EUA will remain in effect (meaning this test can be used) for the duration of the COVID-19 declaration under Section 564(b)(1) of the Act, 21 U.S.C. section 360bbb-3(b)(1), unless the authorization is terminated or revoked sooner. Performed at [facility], [address], [place], [place] [address]          Radiology Studies: CT Head Wo Contrast  Result Date: 02/05/2020 CLINICAL DATA:  49 year old male with dizziness, fall striking head today. Hand weakness for 2 weeks. EXAM: CT HEAD WITHOUT CONTRAST CT CERVICAL SPINE WITHOUT CONTRAST TECHNIQUE: Multidetector CT imaging of the head and cervical spine was performed following the standard protocol without intravenous contrast. Multiplanar CT image reconstructions of the cervical spine were also generated. COMPARISON:  None. FINDINGS: CT HEAD FINDINGS Brain: Generalized cerebral volume loss appears advanced for age. Incidental bulky dural calcifications. No midline shift, ventriculomegaly, mass effect, evidence of mass lesion, intracranial hemorrhage or evidence of cortically based acute infarction. Gray-white matter differentiation is within normal limits throughout the brain. No cortical encephalomalacia identified. Vascular: No suspicious intracranial vascular hyperdensity. Skull: No fracture identified. Sinuses/Orbits: There is Visualized paranasal sinuses and mastoids are clear. Other: Mild right  vertex scalp hematoma suspected on series 3, image 66. Underlying calvarium intact. Possible additional left posterior convexity scalp hematoma on image 33, underlying bone also appears intact. Other orbit and scalp soft tissues appear negative. CT CERVICAL SPINE FINDINGS Alignment: Mild straightening of cervical lordosis. Cervicothoracic junction alignment is within normal limits. Bilateral posterior element alignment is within normal limits. Skull base and vertebrae: Visualized skull base is intact. No atlanto-occipital dissociation. C1 and C2 appear intact and normally aligned. Mild levoconvex cervical spine curvature. No acute osseous abnormality identified. Soft tissues and spinal canal: No prevertebral fluid or swelling. No visible canal hematoma. Negative noncontrast neck soft tissues.  There is nonspecific skin and dermal thickening along the mandible, with a small cystic area on the left series 4, image 51. Disc levels: Advanced degenerative changes at the anterior C1 odontoid articulation with vacuum phenomena. Chronic facet degeneration on the right at C2-C3 also with vacuum phenomena. Elsewhere chronic cervical disc and endplate degeneration. Vacuum disc at C6-C7. Mild if any cervical spinal stenosis. Upper chest: Visible upper thoracic levels appear intact. Negative lung apices. IMPRESSION: 1. Mild bilateral scalp soft tissue injury without underlying skull fracture. 2. No acute traumatic injury identified to the brain or the cervical spine. 3. Both cerebral volume loss and cervical spine degeneration appear advanced for age. Electronically Signed   By: Odessa Fleming M.D.   On: 02/05/2020 16:12   CT Cervical Spine Wo Contrast  Result Date: 02/05/2020 CLINICAL DATA:  49 year old male with dizziness, fall striking head today. Hand weakness for 2 weeks. EXAM: CT HEAD WITHOUT CONTRAST CT CERVICAL SPINE WITHOUT CONTRAST TECHNIQUE: Multidetector CT imaging of the head and cervical spine was performed following  the standard protocol without intravenous contrast. Multiplanar CT image reconstructions of the cervical spine were also generated. COMPARISON:  None. FINDINGS: CT HEAD FINDINGS Brain: Generalized cerebral volume loss appears advanced for age. Incidental bulky dural calcifications. No midline shift, ventriculomegaly, mass effect, evidence of mass lesion, intracranial hemorrhage or evidence of cortically based acute infarction. Gray-white matter differentiation is within normal limits throughout the brain. No cortical encephalomalacia identified. Vascular: No suspicious intracranial vascular hyperdensity. Skull: No fracture identified. Sinuses/Orbits: There is Visualized paranasal sinuses and mastoids are clear. Other: Mild right vertex scalp hematoma suspected on series 3, image 66. Underlying calvarium intact. Possible additional left posterior convexity scalp hematoma on image 33, underlying bone also appears intact. Other orbit and scalp soft tissues appear negative. CT CERVICAL SPINE FINDINGS Alignment: Mild straightening of cervical lordosis. Cervicothoracic junction alignment is within normal limits. Bilateral posterior element alignment is within normal limits. Skull base and vertebrae: Visualized skull base is intact. No atlanto-occipital dissociation. C1 and C2 appear intact and normally aligned. Mild levoconvex cervical spine curvature. No acute osseous abnormality identified. Soft tissues and spinal canal: No prevertebral fluid or swelling. No visible canal hematoma. Negative noncontrast neck soft tissues. There is nonspecific skin and dermal thickening along the mandible, with a small cystic area on the left series 4, image 51. Disc levels: Advanced degenerative changes at the anterior C1 odontoid articulation with vacuum phenomena. Chronic facet degeneration on the right at C2-C3 also with vacuum phenomena. Elsewhere chronic cervical disc and endplate degeneration. Vacuum disc at C6-C7. Mild if any  cervical spinal stenosis. Upper chest: Visible upper thoracic levels appear intact. Negative lung apices. IMPRESSION: 1. Mild bilateral scalp soft tissue injury without underlying skull fracture. 2. No acute traumatic injury identified to the brain or the cervical spine. 3. Both cerebral volume loss and cervical spine degeneration appear advanced for age. Electronically Signed   By: Odessa Fleming M.D.   On: 02/05/2020 16:12        Scheduled Meds:  dextrose       enoxaparin (LOVENOX) injection  40 mg Subcutaneous Q24H   feeding supplement (ENSURE ENLIVE)  237 mL Oral BID BM   folic acid  1 mg Oral Daily   insulin aspart  0-9 Units Subcutaneous Q4H   lipase/protease/amylase  36,000 Units Oral TID AC   multivitamin with minerals  1 tablet Oral Daily   pneumococcal 23 valent vaccine  0.5 mL Intramuscular Tomorrow-1000   potassium chloride  40 mEq Oral TID   thiamine  100 mg Oral Daily   Or   thiamine  100 mg Intravenous Daily   Continuous Infusions:  dextrose 5 % and 0.9 % NaCl with KCl 40 mEq/L 100 mL/hr at 02/06/20 1036     LOS: 1 day    Time spent: 35 minutes    Terrina Docter Hoover Brunette, DO [facility]  If 7PM-7AM, please contact night-coverage [email or web address] 02/06/2020, 10:44 AM

## 2020-02-06 NOTE — Progress Notes (Signed)
CBG checked this AM, results were 32. CBG: rechecked and result were 31. MD notified with orders to administer ample of D50. Patient is alert and appropriate at this time.

## 2020-02-07 ENCOUNTER — Inpatient Hospital Stay (HOSPITAL_COMMUNITY): Payer: Medicare Other

## 2020-02-07 LAB — GLUCOSE, CAPILLARY
Glucose-Capillary: 129 mg/dL — ABNORMAL HIGH (ref 70–99)
Glucose-Capillary: 151 mg/dL — ABNORMAL HIGH (ref 70–99)
Glucose-Capillary: 176 mg/dL — ABNORMAL HIGH (ref 70–99)
Glucose-Capillary: 205 mg/dL — ABNORMAL HIGH (ref 70–99)
Glucose-Capillary: 26 mg/dL — CL (ref 70–99)
Glucose-Capillary: 329 mg/dL — ABNORMAL HIGH (ref 70–99)
Glucose-Capillary: 48 mg/dL — ABNORMAL LOW (ref 70–99)
Glucose-Capillary: 88 mg/dL (ref 70–99)
Glucose-Capillary: 91 mg/dL (ref 70–99)

## 2020-02-07 LAB — BASIC METABOLIC PANEL
Anion gap: 7 (ref 5–15)
BUN: 20 mg/dL (ref 6–20)
CO2: 18 mmol/L — ABNORMAL LOW (ref 22–32)
Calcium: 7.8 mg/dL — ABNORMAL LOW (ref 8.9–10.3)
Chloride: 110 mmol/L (ref 98–111)
Creatinine, Ser: 0.83 mg/dL (ref 0.61–1.24)
GFR calc Af Amer: >60 mL/min (ref >60)
GFR calc non Af Amer: >60 mL/min (ref >60)
Glucose, Bld: 160 mg/dL — ABNORMAL HIGH (ref 70–99)
Potassium: 2.9 mmol/L — ABNORMAL LOW (ref 3.5–5.1)
Sodium: 135 mmol/L (ref 135–145)

## 2020-02-07 LAB — CBC
HCT: 25.1 % — ABNORMAL LOW (ref 39.0–52.0)
Hemoglobin: 9.1 g/dL — ABNORMAL LOW (ref 13.0–17.0)
MCH: 34.7 pg — ABNORMAL HIGH (ref 26.0–34.0)
MCHC: 36.3 g/dL — ABNORMAL HIGH (ref 30.0–36.0)
MCV: 95.8 fL (ref 80.0–100.0)
Platelets: 79 10*3/uL — ABNORMAL LOW (ref 150–400)
RBC: 2.62 MIL/uL — ABNORMAL LOW (ref 4.22–5.81)
RDW: 13.2 % (ref 11.5–15.5)
WBC: 34 10*3/uL — ABNORMAL HIGH (ref 4.0–10.5)
nRBC: 0 % (ref 0.0–0.2)

## 2020-02-07 LAB — MAGNESIUM: Magnesium: 1.7 mg/dL (ref 1.7–2.4)

## 2020-02-07 MED ORDER — DEXTROSE 50 % IV SOLN
INTRAVENOUS | Status: AC
  Administered 2020-02-07: 50 mL
  Filled 2020-02-07: qty 50

## 2020-02-07 MED ORDER — INSULIN ASPART 100 UNIT/ML ~~LOC~~ SOLN: 3.0000 [IU] | Freq: Three times a day (TID) | SUBCUTANEOUS | Status: DC

## 2020-02-07 MED ORDER — TRAMADOL HCL 50 MG PO TABS
50.0000 mg | ORAL_TABLET | Freq: Once | ORAL | Status: AC
  Administered 2020-02-07: 50 mg via ORAL
  Filled 2020-02-07: qty 1

## 2020-02-07 MED ORDER — INSULIN ASPART 100 UNIT/ML ~~LOC~~ SOLN: 0.0000 [IU] | Freq: Every day | SUBCUTANEOUS | Status: DC

## 2020-02-07 MED ORDER — INSULIN DETEMIR 100 UNIT/ML ~~LOC~~ SOLN
18.0000 [IU] | Freq: Two times a day (BID) | SUBCUTANEOUS | Status: DC
  Administered 2020-02-07 (×2): 18 [IU] via SUBCUTANEOUS
  Filled 2020-02-07 (×5): qty 0.18

## 2020-02-07 MED ORDER — INSULIN ASPART 100 UNIT/ML ~~LOC~~ SOLN: 0.0000 [IU] | Freq: Three times a day (TID) | SUBCUTANEOUS | Status: DC

## 2020-02-07 MED ORDER — SODIUM CHLORIDE 0.9 % IV BOLUS: 1000.0000 mL | Freq: Once | INTRAVENOUS | Status: DC

## 2020-02-07 NOTE — Progress Notes (Signed)
PROGRESS NOTE    [name]  XLK:[id number] DOB: 01/08/1971 DOA: 02/05/2020 PCP: Patient, No Pcp Per   Brief Narrative:  HPI: [name] L Tottenis a 49 y.o.malewith a history of alcoholism, cirrhosis, GERD, diabetes with history of poorly controlled insulin-dependent diabetic ulcer and amputation of left forefoot, hypertension, chronic pancreatitis. Patient seen for weakness that has been ongoing for months accompanied numbness in his hands left side has been worse, but today his right hand became extremely weak. He is unable to grasp or hold anything, and unable to check his sugars at home. He has an unintentional weight loss of 80 pounds over the past couple of years, and is unable to say how much weight he has lost over the past couple of months. He has chronic diarrhea that has been ongoing with no palliating or provoking factors.   He does drink alcohol. Used to drink approximately 140 ounces of liquor, but is now down to 4 ounces daily. He does not get tremors with withdrawal.  -Patient has been admitted with weakness and muscle wasting in the setting of what appears to be poor type 1 diabetes control along with recent persistent diarrhea which has led to severe hypokalemia.  He also has significant history of alcohol abuse.  His potassium levels and blood glucose levels have improved.  He is noted to have significant leukocytosis today.   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)   Weakness   Diarrhea   Muscle wasting   Weakness and weight loss in the setting of ongoing diarrhea -Appears to be mostly multifactorial with ongoing alcohol abuse and severe protein calorie malnutrition along with brittle diabetes -Acutely he is noted to have some significant hypokalemia which needs to resolve -Serum  B12 noted to be 547 and TSH 2.674 -He will require outpatient neurology referral for EMG testing -HIV testing negative -PT/OT evaluation, pending  Severe hypokalemia secondary to diarrhea-improving -Appears to be a chronic problem -Agree with stool PCR and Imodium with no recent antibiotics noted -He will require GI evaluation inpatient versus outpatient depending on clinical course -Continue aggressive supplementation analgesics p.o. with repeat monitoring  Ongoing hypoglycemia in the setting of type 1 diabetes, now with hyperglycemia -Hold further D5 infusion and increase sliding scale -Start Levemir 18 units twice daily as patient appears to be eating  Severe leukocytosis -Continue to follow in a.m. -GI panel pending -Check KUB and consider C. difficile testing; RN will confirm if there are unformed bowel movements present  Alcohol abuse history -Withdrawal protocol  Severe protein calorie malnutrition -Dietitian recommendations with nutrition supplementation ordered  Hypertension -Currently with soft blood pressure readings -Monitor closely  DVT prophylaxis: Lovenox Code Status: Full Family Communication: None at bedside, patient states he will call Disposition Plan: Continue ongoing treatment of electrolyte derangement and labile blood glucose levels.  Status is: Inpatient  Remains inpatient appropriate because:IV treatments appropriate due to intensity of illness or inability to take PO and Inpatient level of care appropriate due to severity of illness   Dispo: The patient is from: Home  Anticipated d/c is to: Home  Anticipated d/c date is: 3 days  Patient currently is not medically stable to d/c.  He continues to have persistent, severe hypokalemia as well as hypoglycemia with ongoing diarrhea and is not stable for discharge.  Consultants:   None  Procedures:   None  Antimicrobials:    None  Subjective: Patient seen and evaluated today with no new acute complaints or concerns.  He denies any abdominal pain, but is still having frequent, watery stools.  Potassium levels are slowly improving as well as his blood glucose.  Objective: Vitals:   02/06/20 2311 02/07/20 0411 02/07/20 0414 02/07/20 0630  BP: (!) 90/56 (!) 82/58 (!) 74/50 (!) 90/56  Pulse: 86 87 87 82  Resp:  16 16 19   Temp:  99 F (37.2 C) 98.7 F (37.1 C) 98.5 F (36.9 C)  TempSrc:  Oral Oral Oral  SpO2:  100% 100% 100%  Weight:      Height:        Intake/Output Summary (Last 24 hours) at 02/07/2020 1243 Last data filed at 02/07/2020 0900 Gross per 24 hour  Intake 1459.48 ml  Output 1200 ml  Net 259.48 ml   Filed Weights   02/05/20 2138  Weight: 49.5 kg    Examination:  General exam: Appears calm and comfortable, cachectic appearing Respiratory system: Clear to auscultation. Respiratory effort normal. Cardiovascular system: S1 & S2 heard, RRR. No JVD, murmurs, rubs, gallops or clicks. No pedal edema. Gastrointestinal system: Abdomen is nondistended, soft and nontender. No organomegaly or masses felt. Normal bowel sounds heard. Central nervous system: Alert and oriented. No focal neurological deficits. Extremities: Symmetric 5 x 5 power. Skin: No rashes, lesions or ulcers Psychiatry: Judgement and insight appear normal. Mood & affect appropriate.     Data Reviewed: I have personally reviewed following labs and imaging studies  CBC: Recent Labs  Lab 02/05/20 1525 02/05/20 2220 02/06/20 0542 02/07/20 0630  WBC 10.2 12.2* 4.4 34.0*  NEUTROABS 7.7  --   --   --   HGB 12.9* 11.6* 12.0* 9.1*  HCT 35.5* 32.0* 33.2* 25.1*  MCV 95.9 95.0 93.5 95.8  PLT 123* 111* 86* 79*   Basic Metabolic Panel: Recent Labs  Lab 02/05/20 1525 02/05/20 1525 02/05/20 2049 02/05/20 2220 02/06/20 0023 02/06/20 0542 02/07/20 0630  NA 131*  --   --  129*  --  131* 135  K <2.0*   < > 2.0* <2.0*  2.2* <2.0* 2.9*  CL 97*  --   --  96*  --  101 110  CO2 23  --   --  22  --  20* 18*  GLUCOSE 416*  --   --  317*  --  32* 160*  BUN 20  --   --  20  --  19 20  CREATININE 1.06  --   --  0.95  --  0.84 0.83  CALCIUM 9.0  --   --  8.6*  --  8.6* 7.8*  MG 2.2  --   --   --   --  1.8 1.7  PHOS  --   --   --  <1.0*  --   --   --    < > = values in this interval not displayed.   GFR: Estimated Creatinine Clearance: 75.4 mL/min (by C-G formula based on SCr of 0.83 mg/dL). Liver Function Tests: Recent Labs  Lab 02/05/20 1525 02/05/20 2220  AST 29 36  ALT 44 43  ALKPHOS 134* 126  BILITOT 1.2 1.2  PROT 7.2 6.7  ALBUMIN 3.5 3.4*   No results for input(s): LIPASE, AMYLASE in the last 168 hours. No results for input(s): AMMONIA in the last 168 hours. Coagulation Profile: Recent Labs  Lab 02/05/20 1525  INR 1.0   Cardiac Enzymes: No results for input(s): CKTOTAL, CKMB, CKMBINDEX, TROPONINI in the last 168 hours. BNP (last 3 results) No results for input(s): PROBNP in the last 8760 hours. HbA1C: Recent Labs    02/06/20 0542  HGBA1C 10.9*   CBG: Recent Labs  Lab 02/07/20 0002 02/07/20 0034 02/07/20 0412 02/07/20 0718 02/07/20 1124  GLUCAP 48* 205* 176* 151* 329*   Lipid Profile: No results for input(s): CHOL, HDL, LDLCALC, TRIG, CHOLHDL, LDLDIRECT in the last 72 hours. Thyroid Function Tests: Recent Labs    02/06/20 0542  TSH 2.674   Anemia Panel: Recent Labs    02/06/20 0542  VITAMINB12 547  FOLATE 59.1   Sepsis Labs: No results for input(s): PROCALCITON, LATICACIDVEN in the last 168 hours.  Recent Results (from the past 240 hour(s))  SARS Coronavirus 2 by RT PCR (hospital order, performed in [facility] hospital lab) Nasopharyngeal Nasopharyngeal Swab     Status: None   Collection Time: 02/05/20  6:30 PM   Specimen: Nasopharyngeal Swab  Result Value Ref Range Status   SARS Coronavirus 2 NEGATIVE NEGATIVE Final    Comment: (NOTE) SARS-CoV-2 target  nucleic acids are NOT DETECTED. The SARS-CoV-2 RNA is generally detectable in upper and lower respiratory specimens during the acute phase of infection. The lowest concentration of SARS-CoV-2 viral copies this assay can detect is 250 copies / mL. A negative result does not preclude SARS-CoV-2 infection and should not be used as the sole basis for treatment or other patient management decisions.  A negative result may occur with improper specimen collection / handling, submission of specimen other than nasopharyngeal swab, presence of viral mutation(s) within the areas targeted by this assay, and inadequate number of viral copies (<250 copies / mL). A negative result must be combined with clinical observations, patient history, and epidemiological information. Fact Sheet for Patients:   BoilerBrush.com.cy Fact Sheet for Healthcare Providers: https://pope.com/ This test is not yet approved or cleared  by the [place] FDA and has been authorized for detection and/or diagnosis of SARS-CoV-2 by FDA under an Emergency Use Authorization (EUA).  This EUA will remain in effect (meaning this test can be used) for the duration of the COVID-19 declaration under Section 564(b)(1) of the Act, 21 U.S.C. section 360bbb-3(b)(1), unless the authorization is terminated or revoked sooner. Performed at [facility], [address], [place], [place] [address]          Radiology Studies: CT Head Wo Contrast  Result Date: 02/05/2020 CLINICAL DATA:  49 year old male with dizziness, fall striking head today. Hand weakness for 2 weeks. EXAM: CT HEAD WITHOUT CONTRAST CT CERVICAL SPINE WITHOUT CONTRAST TECHNIQUE: Multidetector CT imaging of the head and cervical spine was performed following the standard protocol without intravenous contrast. Multiplanar CT image reconstructions of the cervical spine were also generated. COMPARISON:  None. FINDINGS: CT HEAD  FINDINGS Brain: Generalized cerebral volume loss appears advanced for age. Incidental bulky dural calcifications. No midline shift, ventriculomegaly, mass effect, evidence of mass lesion, intracranial hemorrhage or evidence of cortically based acute infarction. Gray-white matter differentiation is within normal limits throughout the brain. No cortical encephalomalacia identified. Vascular: No suspicious intracranial vascular hyperdensity. Skull: No fracture identified. Sinuses/Orbits: There is Visualized paranasal sinuses and mastoids are clear. Other: Mild right vertex scalp hematoma suspected on series 3, image 66. Underlying calvarium intact. Possible additional left posterior convexity scalp hematoma on image 33, underlying bone also appears intact. Other orbit and scalp soft tissues appear  negative. CT CERVICAL SPINE FINDINGS Alignment: Mild straightening of cervical lordosis. Cervicothoracic junction alignment is within normal limits. Bilateral posterior element alignment is within normal limits. Skull base and vertebrae: Visualized skull base is intact. No atlanto-occipital dissociation. C1 and C2 appear intact and normally aligned. Mild levoconvex cervical spine curvature. No acute osseous abnormality identified. Soft tissues and spinal canal: No prevertebral fluid or swelling. No visible canal hematoma. Negative noncontrast neck soft tissues. There is nonspecific skin and dermal thickening along the mandible, with a small cystic area on the left series 4, image 51. Disc levels: Advanced degenerative changes at the anterior C1 odontoid articulation with vacuum phenomena. Chronic facet degeneration on the right at C2-C3 also with vacuum phenomena. Elsewhere chronic cervical disc and endplate degeneration. Vacuum disc at C6-C7. Mild if any cervical spinal stenosis. Upper chest: Visible upper thoracic levels appear intact. Negative lung apices. IMPRESSION: 1. Mild bilateral scalp soft tissue injury without  underlying skull fracture. 2. No acute traumatic injury identified to the brain or the cervical spine. 3. Both cerebral volume loss and cervical spine degeneration appear advanced for age. Electronically Signed   By: Odessa FlemingH  Hall M.D.   On: 02/05/2020 16:12   CT Cervical Spine Wo Contrast  Result Date: 02/05/2020 CLINICAL DATA:  49 year old male with dizziness, fall striking head today. Hand weakness for 2 weeks. EXAM: CT HEAD WITHOUT CONTRAST CT CERVICAL SPINE WITHOUT CONTRAST TECHNIQUE: Multidetector CT imaging of the head and cervical spine was performed following the standard protocol without intravenous contrast. Multiplanar CT image reconstructions of the cervical spine were also generated. COMPARISON:  None. FINDINGS: CT HEAD FINDINGS Brain: Generalized cerebral volume loss appears advanced for age. Incidental bulky dural calcifications. No midline shift, ventriculomegaly, mass effect, evidence of mass lesion, intracranial hemorrhage or evidence of cortically based acute infarction. Gray-white matter differentiation is within normal limits throughout the brain. No cortical encephalomalacia identified. Vascular: No suspicious intracranial vascular hyperdensity. Skull: No fracture identified. Sinuses/Orbits: There is Visualized paranasal sinuses and mastoids are clear. Other: Mild right vertex scalp hematoma suspected on series 3, image 66. Underlying calvarium intact. Possible additional left posterior convexity scalp hematoma on image 33, underlying bone also appears intact. Other orbit and scalp soft tissues appear negative. CT CERVICAL SPINE FINDINGS Alignment: Mild straightening of cervical lordosis. Cervicothoracic junction alignment is within normal limits. Bilateral posterior element alignment is within normal limits. Skull base and vertebrae: Visualized skull base is intact. No atlanto-occipital dissociation. C1 and C2 appear intact and normally aligned. Mild levoconvex cervical spine curvature. No  acute osseous abnormality identified. Soft tissues and spinal canal: No prevertebral fluid or swelling. No visible canal hematoma. Negative noncontrast neck soft tissues. There is nonspecific skin and dermal thickening along the mandible, with a small cystic area on the left series 4, image 51. Disc levels: Advanced degenerative changes at the anterior C1 odontoid articulation with vacuum phenomena. Chronic facet degeneration on the right at C2-C3 also with vacuum phenomena. Elsewhere chronic cervical disc and endplate degeneration. Vacuum disc at C6-C7. Mild if any cervical spinal stenosis. Upper chest: Visible upper thoracic levels appear intact. Negative lung apices. IMPRESSION: 1. Mild bilateral scalp soft tissue injury without underlying skull fracture. 2. No acute traumatic injury identified to the brain or the cervical spine. 3. Both cerebral volume loss and cervical spine degeneration appear advanced for age. Electronically Signed   By: Odessa FlemingH  Hall M.D.   On: 02/05/2020 16:12        Scheduled Meds: . enoxaparin (LOVENOX) injection  40  mg Subcutaneous Q24H  . feeding supplement (ENSURE ENLIVE)  237 mL Oral BID BM  . folic acid  1 mg Oral Daily  . insulin aspart  0-15 Units Subcutaneous TID WC  . insulin aspart  0-5 Units Subcutaneous QHS  . insulin aspart  3 Units Subcutaneous TID WC  . insulin detemir  18 Units Subcutaneous BID  . lipase/protease/amylase  36,000 Units Oral TID AC  . multivitamin with minerals  1 tablet Oral Daily  . pneumococcal 23 valent vaccine  0.5 mL Intramuscular Tomorrow-1000  . potassium chloride  40 mEq Oral TID  . thiamine  100 mg Oral Daily   Or  . thiamine  100 mg Intravenous Daily   Continuous Infusions: . sodium chloride       LOS: 2 days    Time spent: 35 minutes    Pasqualino Witherspoon Darleen Crocker, DO [facility]  If 7PM-7AM, please contact night-coverage [email or web address] 02/07/2020, 12:43 PM

## 2020-02-08 LAB — GASTROINTESTINAL PANEL BY PCR, STOOL (REPLACES STOOL CULTURE)

## 2020-02-08 LAB — CBC
HCT: 26.9 % — ABNORMAL LOW (ref 39.0–52.0)
Hemoglobin: 9.3 g/dL — ABNORMAL LOW (ref 13.0–17.0)
MCH: 34.6 pg — ABNORMAL HIGH (ref 26.0–34.0)
MCHC: 34.6 g/dL (ref 30.0–36.0)
MCV: 100 fL (ref 80.0–100.0)
Platelets: 91 10*3/uL — ABNORMAL LOW (ref 150–400)
RBC: 2.69 MIL/uL — ABNORMAL LOW (ref 4.22–5.81)
RDW: 14.2 % (ref 11.5–15.5)
WBC: 23.7 10*3/uL — ABNORMAL HIGH (ref 4.0–10.5)
nRBC: 0 % (ref 0.0–0.2)

## 2020-02-08 LAB — COMPREHENSIVE METABOLIC PANEL
ALT: 32 U/L (ref 0–44)
AST: 23 U/L (ref 15–41)
Albumin: 2.5 g/dL — ABNORMAL LOW (ref 3.5–5.0)
Alkaline Phosphatase: 92 U/L (ref 38–126)
Anion gap: 6 (ref 5–15)
BUN: 13 mg/dL (ref 6–20)
CO2: 18 mmol/L — ABNORMAL LOW (ref 22–32)
Calcium: 7.8 mg/dL — ABNORMAL LOW (ref 8.9–10.3)
Chloride: 109 mmol/L (ref 98–111)
Creatinine, Ser: 0.54 mg/dL — ABNORMAL LOW (ref 0.61–1.24)
GFR calc Af Amer: >60 mL/min (ref >60)
GFR calc non Af Amer: >60 mL/min (ref >60)
Glucose, Bld: 38 mg/dL — CL (ref 70–99)
Potassium: 2.9 mmol/L — ABNORMAL LOW (ref 3.5–5.1)
Sodium: 133 mmol/L — ABNORMAL LOW (ref 135–145)
Total Bilirubin: 0.5 mg/dL (ref 0.3–1.2)
Total Protein: 5.2 g/dL — ABNORMAL LOW (ref 6.5–8.1)

## 2020-02-08 LAB — GLUCOSE, CAPILLARY
Glucose-Capillary: 112 mg/dL — ABNORMAL HIGH (ref 70–99)
Glucose-Capillary: 58 mg/dL — ABNORMAL LOW (ref 70–99)
Glucose-Capillary: 134 mg/dL — ABNORMAL HIGH (ref 70–99)
Glucose-Capillary: 86 mg/dL (ref 70–99)
Glucose-Capillary: 174 mg/dL — ABNORMAL HIGH (ref 70–99)
Glucose-Capillary: 84 mg/dL (ref 70–99)
Glucose-Capillary: 232 mg/dL — ABNORMAL HIGH (ref 70–99)
Glucose-Capillary: 187 mg/dL — ABNORMAL HIGH (ref 70–99)

## 2020-02-08 LAB — MAGNESIUM: Magnesium: 1.6 mg/dL — ABNORMAL LOW (ref 1.7–2.4)

## 2020-02-08 MED ORDER — INSULIN ASPART 100 UNIT/ML ~~LOC~~ SOLN
0.0000 [IU] | SUBCUTANEOUS | Status: DC
  Administered 2020-02-09: 5 [IU] via SUBCUTANEOUS

## 2020-02-08 MED ORDER — MAGNESIUM SULFATE 2 GM/50ML IV SOLN
2.0000 g | Freq: Once | INTRAVENOUS | Status: AC
  Administered 2020-02-08: 2 g via INTRAVENOUS
  Filled 2020-02-08: qty 50

## 2020-02-08 MED ORDER — DEXTROSE 50 % IV SOLN
INTRAVENOUS | Status: AC
  Filled 2020-02-08: qty 50

## 2020-02-08 MED ORDER — DEXTROSE 50 % IV SOLN
50.0000 mL | Freq: Once | INTRAVENOUS | Status: AC
  Administered 2020-02-08: 50 mL via INTRAVENOUS

## 2020-02-08 MED ORDER — DEXTROSE 50 % IV SOLN
1.0000 | Freq: Once | INTRAVENOUS | Status: AC
  Administered 2020-02-08: 50 mL via INTRAVENOUS

## 2020-02-08 MED ORDER — POTASSIUM CHLORIDE IN NACL 40-0.9 MEQ/L-% IV SOLN
INTRAVENOUS | Status: DC
  Administered 2020-02-08 – 2020-02-09 (×2): 75 mL/h via INTRAVENOUS

## 2020-02-08 NOTE — Progress Notes (Addendum)
Hypoglycemic Event  CBG: 38  Treatment: 50 % dextrose injection.   Symptoms: Decreased level of consciousness, increased sweating.   Follow-up CBG: Time 0739  CBG Result: 134  Possible Reasons for Event: Decreased PO intake.   Patient currently has orders for:   Levemir 18 units BID.  Novolog 3 units meal coverage. Novolog 0-15 units daily with meals Novolog 0-5 units at bedtime.  Comments/MD notified:  Patient has hx of DM Type I Dr. Sherryll Burger has been notified and is currently assessing patient in room.   Verbal order received to not administer Levemir this am or Novolog.  Increased monitoring of patient's CBG.     Chrisha Vogel C

## 2020-02-08 NOTE — TOC Progression Note (Signed)
Transition of Care [facility] - Progression Note    Patient Details  Name: [name] MRN: [id number] Date of Birth: 1970-10-31  [facility] of [facility]) CM/SW Contact  Karn Cass[place], [place] Phone Number: 02/08/2020, 8:44 AM  Clinical Narrative:   TOC received consult for medication assistance. Pt has [employer] Medicare and does not qualify for medication assistance. Will continue to follow for discharge planning needs.    Expected Discharge Plan and Services     Social Determinants of Health (SDOH) Interventions    Readmission Risk Interventions No flowsheet data found.

## 2020-02-08 NOTE — Progress Notes (Signed)
Notified by tech, CBG 25, re-checked with reading of 26, dextrose 1gm administered, snack and juice given, pt alert and oriented. On call MD Bruna Potter was notified with no further orders. CBG 91 within 10 mins.

## 2020-02-08 NOTE — Evaluation (Signed)
Physical Therapy Evaluation Patient Details Name: [name] MRN: [id number] DOB: [birth year] Today's Date: 02/08/2020   History of Present Illness  [name] is a 49 y.o. male with a history of alcoholism, cirrhosis, GERD, diabetes with history of poorly controlled insulin-dependent diabetic ulcer and amputation of left forefoot, hypertension, chronic pancreatitis.  Patient seen for weakness that has been ongoing for months accompanied numbness in his hands left side has been worse, but today his right hand became extremely weak.  He is unable to grasp or hold anything, and unable to check his sugars at home.  He has an unintentional weight loss of 80 pounds over the past couple of years, and is unable to say how much weight he has lost over the past couple of months.  He has chronic diarrhea that has been ongoing with no palliating or provoking factors.    Clinical Impression  Patient functioning at baseline for functional mobility and gait.  Plan:  Patient discharged from physical therapy to care of nursing for ambulation daily as tolerated for length of stay.     Follow Up Recommendations Home health PT;Supervision for mobility/OOB;Supervision - Intermittent    Equipment Recommendations  None recommended by PT    Recommendations for Other Services       Precautions / Restrictions Precautions Precautions: Fall Restrictions Weight Bearing Restrictions: No      Mobility  Bed Mobility Overal bed mobility: Independent                Transfers Overall transfer level: Modified independent Equipment used: Straight cane                Ambulation/Gait Ambulation/Gait assistance: Supervision Gait Distance (Feet): 75 Feet Assistive device: Straight cane Gait Pattern/deviations: Decreased step length - right;Decreased step length - left;Decreased stride length;Ataxic;Staggering left;Staggering right Gait velocity: decreased   General Gait Details: slightly  labored cadence with occasional staggering left/right without loss of balance, limited secondary to fatigue  Stairs            Wheelchair Mobility    Modified Rankin (Stroke Patients Only)       Balance Overall balance assessment: Needs assistance Sitting-balance support: Feet supported;No upper extremity supported Sitting balance-Leahy Scale: Good Sitting balance - Comments: seated at EOB   Standing balance support: During functional activity;Single extremity supported Standing balance-Leahy Scale: Fair Standing balance comment: using SPC                             Pertinent Vitals/Pain Pain Assessment: No/denies pain    Home Living Family/patient expects to be discharged to:: Private residence   Available Help at Discharge: Family;Available 24 hours/day Type of Home: House Home Access: Stairs to enter Entrance Stairs-Rails: None Entrance Stairs-Number of Steps: 3 Home Layout: Laundry or work area in basement;Able to live on main level with bedroom/bathroom Home Equipment: Marine scientist - single point      Prior Function Level of Independence: Independent with assistive device(s)         Comments: household ambulation using SPC     Hand Dominance   Dominant Hand: Right    Extremity/Trunk Assessment   Upper Extremity Assessment Upper Extremity Assessment: Overall WFL for tasks assessed    Lower Extremity Assessment Lower Extremity Assessment: Generalized weakness    Cervical / Trunk Assessment Cervical / Trunk Assessment: Normal  Communication   Communication: No difficulties  Cognition Arousal/Alertness: Awake/alert Behavior During Therapy: WFL for  tasks assessed/performed Overall Cognitive Status: Within Functional Limits for tasks assessed                                        General Comments      Exercises     Assessment/Plan    PT Assessment All further PT needs can be met in the next venue of  care  PT Problem List Decreased strength;Decreased activity tolerance;Decreased balance;Decreased mobility       PT Treatment Interventions      PT Goals (Current goals can be found in the Care Plan section)  Acute Rehab PT Goals Patient Stated Goal: return home with family to assist PT Goal Formulation: With patient Time For Goal Achievement: 02/08/20 Potential to Achieve Goals: Good    Frequency     Barriers to discharge        Co-evaluation               AM-PAC PT "6 Clicks" Mobility  Outcome Measure Help needed turning from your back to your side while in a flat bed without using bedrails?: None Help needed moving from lying on your back to sitting on the side of a flat bed without using bedrails?: None Help needed moving to and from a bed to a chair (including a wheelchair)?: None Help needed standing up from a chair using your arms (e.g., wheelchair or bedside chair)?: None Help needed to walk in hospital room?: A Little Help needed climbing 3-5 steps with a railing? : A Lot 6 Click Score: 21    End of Session   Activity Tolerance: Patient tolerated treatment well Patient left: in bed;with call bell/phone within reach Nurse Communication: Mobility status PT Visit Diagnosis: Unsteadiness on feet (R26.81);Other abnormalities of gait and mobility (R26.89);Muscle weakness (generalized) (M62.81)    Time: 0940-1000 PT Time Calculation (min) (ACUTE ONLY): 20 min   Charges:   PT Evaluation $PT Eval Moderate Complexity: 1 Mod PT Treatments $Therapeutic Activity: 8-22 mins        12:36 PM, 02/08/20 Lonell Grandchild, MPT Physical Therapist with [facility] 336 [phone] office [phone] mobile phone

## 2020-02-08 NOTE — Progress Notes (Signed)
CBG 58, notified Dr. Welton Flakes to make aware, new order for dextrose 1 gm. Administered as ordered, CBG 112 within 30 mins. Will continue to monitor, pt alert and eating snack.

## 2020-02-08 NOTE — Progress Notes (Signed)
OT Screen Note  Patient Details Name: [name] MRN: [id number] DOB: April 12, 1971   Cancelled Treatment:    Reason Eval/Treat Not Completed: OT screened, no needs identified, will sign off(Per PT, pt is back to baseline function from ADLs and mobility. Per pt report, he feel back to baseline and has been getting to/from bathroom without difficulty. No further acute OT needs. Will screen and sign off.)  Drinda Belgard M Alejah Aristizabal Aramis Zobel MSOT, OTR/L Acute Rehab Pager: [phone] 02/08/2020, 11:06 AM

## 2020-02-08 NOTE — Progress Notes (Signed)
Inpatient Diabetes Program Recommendations  AACE/ADA: New Consensus Statement on Inpatient Glycemic Control (2015)  Target Ranges:  Prepandial:   less than 140 mg/dL      Peak postprandial:   less than 180 mg/dL (1-2 hours)      Critically ill patients:  140 - 180 mg/dL   Lab Results  Component Value Date   GLUCAP 134 (H) 02/08/2020   HGBA1C 10.9 (H) 02/06/2020    Review of Glycemic Control Results for [name], [name] (MRN [id number]) as of 02/08/2020 10:02  Ref. Range 02/08/2020 04:11 02/08/2020 07:28 02/08/2020 07:37  Glucose-Capillary Latest Ref Range: 70 - 99 mg/dL 601 (H) <65 (LL) 800 (H)   Diabetes history: Type 1 DM Outpatient Diabetes medications: Novolog 75/25 100 units BID Current orders for Inpatient glycemic control: Novolog 0-9 units Q4H  Inpatient Diabetes Program Recommendations:    Noted significant hypoglycemia this AM and insulin adjustments. Assuming this is may be partially related to lack of endogenous glucagon release from pancreas, in the setting of ETOH abuse, pancreatic hx and metabolic derangements.  If lows continue, could consider adding Dextrose to IV fluids. May need basal restarted once CBG>180 mg/dL consistently. Patient is followed by Dr Fransico Him, outpatient endocrinology.  Will follow.   Thanks, Lujean Rave, MSN, RNC-OB Diabetes Coordinator [phone] (8a-5p)

## 2020-02-08 NOTE — Care Management Important Message (Signed)
Important Message  Patient Details  Name: [name] MRN: [id number] Date of Birth: 23-Jul-1971   Medicare Important Message Given:  Yes(Lisa, RN will deliver letter due to contact precautions)     Corey Harold 02/08/2020, 4:13 PM

## 2020-02-08 NOTE — TOC Initial Note (Signed)
Transition of Care [facility]) - Initial/Assessment Note    Patient Details  Name: [name] MRN: [id number] Date of Birth: 01-30-1971  [facility]) CM/SW Contact:    Karn Cassis, LCSW Phone Number: 02/08/2020, 1:59 PM  Clinical Narrative:  Pt reports he lives at home with brother who is best support. Pt evaluated by PT and recommendation is for HHPT. LCSW discussed choices with pt and made referral to [facility]. Pt reports he has a cane and a walker at home if needed. He is fairly independent with ADLs at baseline. LCSW will continue to follow for discharge planning needs.                  Expected Discharge Plan: Home w Home Health Services Barriers to Discharge: Continued Medical Work up   Patient Goals and CMS Choice Patient states their goals for this hospitalization and ongoing recovery are:: return home   Choice offered to / list presented to : Patient  Expected Discharge Plan and Services Expected Discharge Plan: Home w Home Health Services In-house Referral: Clinical Social Work   Post Acute Care Choice: Home Health Living arrangements for the past 2 months: Single Family Home                           HH Arranged: PT HH Agency: [facility] Date HH Agency Contacted: 02/08/20 Time HH Agency Contacted: [phone] Representative spoke with at [facility] Agency: Alroy Bailiff  Prior Living Arrangements/Services Living arrangements for the past 2 months: Single Family Home Lives with:: Siblings Patient language and need for interpreter reviewed:: Yes Do you feel safe going back to the place where you live?: Yes      Need for Family Participation in Patient Care: No (Comment)   Current home services: DME(has cane and rolling walker) Criminal Activity/Legal Involvement Pertinent to Current Situation/Hospitalization: No - Comment as needed  Activities of Daily Living Home Assistive Devices/Equipment: Cane (specify quad or  straight), Blood pressure cuff ADL Screening (condition at time of admission) Patient's cognitive ability adequate to safely complete daily activities?: Yes Is the patient deaf or have difficulty hearing?: Yes Does the patient have difficulty seeing, even when wearing glasses/contacts?: No Does the patient have difficulty concentrating, remembering, or making decisions?: No Patient able to express need for assistance with ADLs?: Yes Does the patient have difficulty dressing or bathing?: No Independently performs ADLs?: Yes (appropriate for developmental age) Does the patient have difficulty walking or climbing stairs?: Yes Weakness of Legs: Both Weakness of Arms/Hands: Both(hands)  Permission Sought/Granted   Permission granted to share information with : Yes, Verbal Permission Granted     Permission granted to share info w AGENCY: [facility]  Permission granted to share info w Relationship: Home heath     Emotional Assessment Appearance:: Appears stated age Attitude/Demeanor/Rapport: Engaged Affect (typically observed): Accepting Orientation: : Oriented to Self, Oriented to Place, Oriented to  Time, Oriented to Situation Alcohol / Substance Use: Alcohol Use    Admission diagnosis:  Hypokalemia [E87.6] Weakness of both arms [R29.898] Patient Active Problem List   Diagnosis Date Noted  . Weakness 02/05/2020  . Diarrhea 02/05/2020  . Muscle wasting 02/05/2020  . Diabetic foot ulcer with osteomyelitis (HCC) 08/19/2017  . Hyponatremia 08/19/2017  . 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 Acute osteomyelitis of  left foot ([place]) 02/16/2017  . Protein-calorie malnutrition, severe 02/16/2017  . Subacute osteomyelitis of left foot ([place])   . Diabetic foot ([place]) 11/28/2016  . Gangrene ([place]) 11/28/2016  . Uncontrolled type 1 diabetes mellitus with complication ([place]) 40/98/1191  .  Essential hypertension, benign 07/14/2015  . Exocrine pancreatic insufficiency ([place]) 03/31/2013  . Alcohol-induced chronic pancreatitis ([place]) 02/16/2013  . Elevated LFTs 02/16/2013  . Pancreatic mass 02/16/2013  . Hypokalemia 02/16/2013  . GERD 10/31/2009  . IDDM (insulin dependent diabetes mellitus) ([place]) 08/30/2009  . Alcohol abuse 08/30/2009  . Tobacco use 08/30/2009  . Cannabis abuse 08/30/2009  . PANCREATITIS 08/30/2009   PCP:  Patient, No Pcp Per Pharmacy:   [place], [place] - [address] [place] [address] Phone: [phone] Fax: [phone]     Social Determinants of Health (SDOH) Interventions    Readmission Risk Interventions No flowsheet data found.

## 2020-02-08 NOTE — Progress Notes (Signed)
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 Patient seen for weakness that has been ongoing for months accompanied numbness in his hands left side has been worse, but today his right hand became extremely weak. He is unable to grasp or hold anything, and unable to check his sugars at home. He has an unintentional weight loss of 80 pounds over the past couple of years, and is unable to say how much weight he has lost over the past couple of months. He has chronic diarrhea that has been ongoing with no palliating or provoking factors.   He does drink alcohol. Used to drink approximately 140 ounces of liquor, but is now down to 4 ounces daily. He does not get tremors with withdrawal.  -Patient has been admitted with weakness and muscle wasting in the setting of what appears to be poor type 1 diabetes control along with recent persistent diarrhea which has led to severe hypokalemia. He also has significant history of alcohol abuse.  He has very labile blood glucose readings and was noted to be quite hyperglycemic on the morning of 5/24.  He continues to have persistent hypokalemia.  PT evaluation pending.   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)   Weakness   Diarrhea   Muscle wasting   Weakness and weight loss in the setting of ongoing diarrhea -Appears to be mostly multifactorial with ongoing alcohol abuse and severe protein calorie malnutrition along with brittle diabetes -Acutely he is noted to  have some significant hypokalemia which needs to resolve -Serum B12 noted to be 547 and TSH 2.674 -He will require outpatient neurology referral for EMG testing -HIV testing negative -PT/OT evaluation, pending  Severe hypokalemia secondary to diarrhea-improving -Appears to be a chronic problem -Agree with stool PCR and Imodium with no recent antibiotics noted -Continue aggressive supplementation p.o. and IV with repeat monitoring  Ongoing hypoglycemia in the setting of type 1 diabetes, now with hypoglycemia -Hold mealtime insulin and Levemir and start on low-dose SSI every 4 hours -Appears to be eating well and will hold off on D5 infusion  Severe leukocytosis-downtrending -Continue to follow in a.m. -GI panel pending -Patient has some formed stool and therefore will not test C. Difficile -KUB with no significant findings noted  Alcohol abuse history -Withdrawal protocol  Severe protein calorie malnutrition -Dietitian recommendations with nutrition supplementation ordered  Hypertension -Currently with soft blood pressure readings -Monitor closely  DVT prophylaxis:Lovenox Code Status:Full Family Communication:None at bedside, patient states he will call Disposition Plan:Continue ongoing treatment of electrolyte derangement and labile blood glucose levels.  Status is: Inpatient  Remains inpatient appropriate because:IV treatments appropriate due to intensity of illness or inability to take PO and Inpatient level of care appropriate due to severity of illness   Dispo: The patient is from:Home Anticipated d/c is OE:UMPN Anticipated d/c date is: 2-3 days Patient currentlyis not medically stable to d/c.He continues to have persistent, severe hypokalemia as well as hypoglycemia with ongoing diarrhea and is not stable for discharge.   Consultants:  None  Procedures:  None  Antimicrobials:    None  Subjective: Patient seen and evaluated today with decreased alertness this morning and was noted to have low CBG.  He improved significantly after D50 administration.  He denies any further significant diarrhea or abdominal pain and appears to be eating regularly.  Objective: Vitals:   02/08/20 0517 02/08/20 0732 02/08/20 0800 02/08/20 0823  BP: 91/63 (!) 89/65 99/68   Pulse: 73 66 61   Resp: 16 16 16    Temp: 97.7 F (36.5 C) 98.7 F (37.1 C) 98.7 F (37.1 C)   TempSrc: Oral Oral Oral   SpO2: 100% 100% 100% 100%  Weight:      Height:        Intake/Output Summary (Last 24 hours) at 02/08/2020 0919 Last data filed at 02/07/2020 1700 Gross per 24 hour  Intake 480 ml  Output --  Net 480 ml   Filed Weights   02/05/20 2138  Weight: 49.5 kg    Examination:  General exam: Appears calm and comfortable, emaciated Respiratory system: Clear to auscultation. Respiratory effort normal. Cardiovascular system: S1 & S2 heard, RRR. No JVD, murmurs, rubs, gallops or clicks. No pedal edema. Gastrointestinal system: Abdomen is nondistended, soft and nontender. No organomegaly or masses felt. Normal bowel sounds heard. Central nervous system: Alert and oriented. No focal neurological deficits. Extremities: Symmetric 5 x 5 power. Skin: No rashes, lesions or ulcers Psychiatry: Judgement and insight appear normal. Mood & affect appropriate.     Data Reviewed: I have personally reviewed following labs and imaging studies  CBC: Recent Labs  Lab 02/05/20 1525 02/05/20 2220 02/06/20 0542 02/07/20 0630 02/08/20 0607  WBC 10.2 12.2* 4.4 34.0* 23.7*  NEUTROABS 7.7  --   --   --   --   HGB 12.9* 11.6* 12.0* 9.1* 9.3*  HCT 35.5* 32.0* 33.2* 25.1* 26.9*  MCV 95.9 95.0 93.5 95.8 100.0  PLT 123* 111* 86* 79* 91*   Basic Metabolic Panel: Recent Labs  Lab 02/05/20 1525 02/05/20 2049 02/05/20 2220 02/06/20 0023 02/06/20 0542 02/07/20 0630 02/08/20 0607  NA 131*  --  129*  --   131* 135 133*  K <2.0*   < > <2.0* 2.2* <2.0* 2.9* 2.9*  CL 97*  --  96*  --  101 110 109  CO2 23  --  22  --  20* 18* 18*  GLUCOSE 416*  --  317*  --  32* 160* 38*  BUN 20  --  20  --  19 20 13   CREATININE 1.06  --  0.95  --  0.84 0.83 0.54*  CALCIUM 9.0  --  8.6*  --  8.6* 7.8* 7.8*  MG 2.2  --   --   --  1.8 1.7 1.6*  PHOS  --   --  <1.0*  --   --   --   --    < > = values in this interval not displayed.   GFR: Estimated Creatinine Clearance: 78.2 mL/min (A) (by C-G formula based on SCr of 0.54 mg/dL (L)). Liver Function Tests: Recent Labs  Lab 02/05/20 1525 02/05/20 2220 02/08/20 0607  AST 29 36 23  ALT 44 43 32  ALKPHOS 134* 126 92  BILITOT 1.2 1.2 0.5  PROT 7.2 6.7 5.2*  ALBUMIN 3.5 3.4* 2.5*   No results for input(s): LIPASE, AMYLASE in the last 168 hours. No results for input(s): AMMONIA in the last 168 hours. Coagulation Profile: Recent Labs  Lab 02/05/20 1525  INR  1.0   Cardiac Enzymes: No results for input(s): CKTOTAL, CKMB, CKMBINDEX, TROPONINI in the last 168 hours. BNP (last 3 results) No results for input(s): PROBNP in the last 8760 hours. HbA1C: Recent Labs    02/06/20 0542  HGBA1C 10.9*   CBG: Recent Labs  Lab 02/07/20 2359 02/08/20 0337 02/08/20 0411 02/08/20 0728 02/08/20 0737  GLUCAP 91 58* 112* <10* 134*   Lipid Profile: No results for input(s): CHOL, HDL, LDLCALC, TRIG, CHOLHDL, LDLDIRECT in the last 72 hours. Thyroid Function Tests: Recent Labs    02/06/20 0542  TSH 2.674   Anemia Panel: Recent Labs    02/06/20 0542  VITAMINB12 547  FOLATE 59.1   Sepsis Labs: No results for input(s): PROCALCITON, LATICACIDVEN in the last 168 hours.  Recent Results (from the past 240 hour(s))  SARS Coronavirus 2 by RT PCR (hospital order, performed in [facility] hospital lab) Nasopharyngeal Nasopharyngeal Swab     Status: None   Collection Time: 02/05/20  6:30 PM   Specimen: Nasopharyngeal Swab  Result Value Ref Range Status   SARS  Coronavirus 2 NEGATIVE NEGATIVE Final    Comment: (NOTE) SARS-CoV-2 target nucleic acids are NOT DETECTED. The SARS-CoV-2 RNA is generally detectable in upper and lower respiratory specimens during the acute phase of infection. The lowest concentration of SARS-CoV-2 viral copies this assay can detect is 250 copies / mL. A negative result does not preclude SARS-CoV-2 infection and should not be used as the sole basis for treatment or other patient management decisions.  A negative result may occur with improper specimen collection / handling, submission of specimen other than nasopharyngeal swab, presence of viral mutation(s) within the areas targeted by this assay, and inadequate number of viral copies (<250 copies / mL). A negative result must be combined with clinical observations, patient history, and epidemiological information. Fact Sheet for Patients:   BoilerBrush.com.cy Fact Sheet for Healthcare Providers: https://pope.com/ This test is not yet approved or cleared  by the [place] FDA and has been authorized for detection and/or diagnosis of SARS-CoV-2 by FDA under an Emergency Use Authorization (EUA).  This EUA will remain in effect (meaning this test can be used) for the duration of the COVID-19 declaration under Section 564(b)(1) of the Act, 21 U.S.C. section 360bbb-3(b)(1), unless the authorization is terminated or revoked sooner. Performed at [facility], [address], [place], [place] [address]   Culture, blood (routine x 2)     Status: None (Preliminary result)   Collection Time: 02/07/20  4:53 PM   Specimen: BLOOD  Result Value Ref Range Status   Specimen Description BLOOD RIGHT ANTECUBITAL  Final   Special Requests AEROBIC BOTTLE ONLY Blood Culture adequate volume  Final   Culture   Final    NO GROWTH < 24 HOURS Performed at [facility], [address] [place], [place] [address]    Report Status PENDING   Incomplete  Culture, blood (routine x 2)     Status: None (Preliminary result)   Collection Time: 02/07/20  4:53 PM   Specimen: BLOOD  Result Value Ref Range Status   Specimen Description BLOOD RIGHT ANTECUBITAL  Final   Special Requests   Final    BOTTLES DRAWN AEROBIC AND ANAEROBIC Blood Culture adequate volume   Culture   Final    NO GROWTH < 24 HOURS Performed at [facility], [address], [place], [place] [address]    Report Status PENDING  Incomplete         Radiology  Studies: DG Abd 1 View  Result Date: 02/07/2020 CLINICAL DATA:  Diarrhea EXAM: ABDOMEN - 1 VIEW COMPARISON:  02/15/2013 FINDINGS: Scattered large and small bowel gas is noted. Mild retained fecal material in the rectum is seen which may represent early impaction. Degenerative changes of lumbar spine are seen. No free air is noted. No other focal abnormality is noted. IMPRESSION: Question early impaction in the rectum. No other focal abnormality is noted. Electronically Signed   By: Alcide Clever M.D.   On: 02/07/2020 15:19        Scheduled Meds:  dextrose       enoxaparin (LOVENOX) injection  40 mg Subcutaneous Q24H   feeding supplement (ENSURE ENLIVE)  237 mL Oral BID BM   folic acid  1 mg Oral Daily   insulin aspart  0-9 Units Subcutaneous Q4H   lipase/protease/amylase  36,000 Units Oral TID AC   multivitamin with minerals  1 tablet Oral Daily   pneumococcal 23 valent vaccine  0.5 mL Intramuscular Tomorrow-1000   potassium chloride  40 mEq Oral TID   thiamine  100 mg Oral Daily   Or   thiamine  100 mg Intravenous Daily   Continuous Infusions:  0.9 % NaCl with KCl 40 mEq / L     magnesium sulfate bolus IVPB     sodium chloride       LOS: 3 days    Time spent: 35 minutes    Subrina Vecchiarelli Hoover Brunette, DO [facility]  If 7PM-7AM, please contact night-coverage [email or web address] 02/08/2020, 9:19 AM

## 2020-02-09 LAB — BASIC METABOLIC PANEL
Anion gap: 5 (ref 5–15)
BUN: 6 mg/dL (ref 6–20)
CO2: 19 mmol/L — ABNORMAL LOW (ref 22–32)
Calcium: 7.4 mg/dL — ABNORMAL LOW (ref 8.9–10.3)
Chloride: 109 mmol/L (ref 98–111)
Creatinine, Ser: 0.5 mg/dL — ABNORMAL LOW (ref 0.61–1.24)
GFR calc Af Amer: >60 mL/min (ref >60)
GFR calc non Af Amer: >60 mL/min (ref >60)
Glucose, Bld: 156 mg/dL — ABNORMAL HIGH (ref 70–99)
Potassium: 4.2 mmol/L (ref 3.5–5.1)
Sodium: 133 mmol/L — ABNORMAL LOW (ref 135–145)

## 2020-02-09 LAB — CBC
HCT: 26.5 % — ABNORMAL LOW (ref 39.0–52.0)
Hemoglobin: 9.2 g/dL — ABNORMAL LOW (ref 13.0–17.0)
MCH: 35 pg — ABNORMAL HIGH (ref 26.0–34.0)
MCHC: 34.7 g/dL (ref 30.0–36.0)
MCV: 100.8 fL — ABNORMAL HIGH (ref 80.0–100.0)
Platelets: 106 10*3/uL — ABNORMAL LOW (ref 150–400)
RBC: 2.63 MIL/uL — ABNORMAL LOW (ref 4.22–5.81)
RDW: 14.2 % (ref 11.5–15.5)
WBC: 10.9 10*3/uL — ABNORMAL HIGH (ref 4.0–10.5)
nRBC: 0 % (ref 0.0–0.2)

## 2020-02-09 LAB — GLUCOSE, CAPILLARY
Glucose-Capillary: 221 mg/dL — ABNORMAL HIGH (ref 70–99)
Glucose-Capillary: 223 mg/dL — ABNORMAL HIGH (ref 70–99)
Glucose-Capillary: 161 mg/dL — ABNORMAL HIGH (ref 70–99)
Glucose-Capillary: 130 mg/dL — ABNORMAL HIGH (ref 70–99)
Glucose-Capillary: 275 mg/dL — ABNORMAL HIGH (ref 70–99)

## 2020-02-09 LAB — MAGNESIUM: Magnesium: 1.5 mg/dL — ABNORMAL LOW (ref 1.7–2.4)

## 2020-02-09 MED ORDER — THIAMINE HCL 100 MG PO TABS: 100.0000 mg | ORAL_TABLET | Freq: Every day | ORAL | 0 refills | Status: AC

## 2020-02-09 MED ORDER — FOLIC ACID 1 MG PO TABS: 1.0000 mg | ORAL_TABLET | Freq: Every day | ORAL | 0 refills | Status: AC

## 2020-02-09 MED ORDER — MAGNESIUM SULFATE 2 GM/50ML IV SOLN
2.0000 g | Freq: Once | INTRAVENOUS | Status: AC
  Administered 2020-02-09: 2 g via INTRAVENOUS
  Filled 2020-02-09: qty 50

## 2020-02-09 MED ORDER — POTASSIUM CHLORIDE ER 10 MEQ PO TBCR: 10.0000 meq | EXTENDED_RELEASE_TABLET | Freq: Every day | ORAL | 0 refills | Status: AC

## 2020-02-09 MED ORDER — PANCRELIPASE (LIP-PROT-AMYL) 36000-114000 UNITS PO CPEP: 72000.0000 [IU] | ORAL_CAPSULE | Freq: Three times a day (TID) | ORAL | 3 refills | Status: AC

## 2020-02-09 MED ORDER — LOPERAMIDE HCL 2 MG PO CAPS: 2.0000 mg | ORAL_CAPSULE | ORAL | 0 refills | Status: AC | PRN

## 2020-02-09 MED ORDER — ADULT MULTIVITAMIN W/MINERALS CH: 1.0000 | ORAL_TABLET | Freq: Every day | ORAL | 2 refills | Status: AC

## 2020-02-09 MED ORDER — ENSURE ENLIVE PO LIQD: 237.0000 mL | Freq: Two times a day (BID) | ORAL | 12 refills | Status: AC

## 2020-02-09 NOTE — TOC Progression Note (Signed)
Transition of Care [facility]) - Progression Note    Patient Details  Name: [name] MRN: [id number] Date of Birth: Nov 19, 1970  Transition of Care [facility]) CM/SW Contact  Karn Cassis, [place] Phone Number: 02/09/2020, 10:58 AM  Clinical Narrative:  Pt d/c today home with home health services. [facility] with [facility] notified. Pt does not have PCP. Hospitalist notified and will write orders for 2 weeks until pt can be established with PCP. LCSW provided list of PCPs to pt.     Expected Discharge Plan: Home w Home Health Services Barriers to Discharge: Barriers Resolved  Expected Discharge Plan and Services Expected Discharge Plan: Home w Home Health Services In-house Referral: Clinical Social Work   Post Acute Care Choice: Home Health Living arrangements for the past 2 months: Single Family Home Expected Discharge Date: 02/09/20                         HH Arranged: RN, PT HH Agency: [facility] Date HH Agency Contacted: 02/08/20 Time HH Agency Contacted: [phone] Representative spoke with at [facility] Agency: Alroy Bailiff   Social Determinants of Health (SDOH) Interventions    Readmission Risk Interventions No flowsheet data found.

## 2020-02-09 NOTE — Discharge Summary (Signed)
Physician Discharge Summary  [name]:[id number] DOB: [birth year] DOA: 02/05/2020  PCP: Patient, No Pcp Per  Admit date: 02/05/2020  Discharge date: 02/09/2020  Admitted From:Home  Disposition:  Home  Recommendations for Outpatient Follow-up:  1. Follow up with PCP in 1-2 weeks 2. Please obtain BMP in one week 3. Remain on potassium supplementation as prescribed 4. Increased Creon dosing to 72,000 units with meals and snacks and follow-up with GI as recommended in 4 weeks 5. Imodium prescribed as needed 6. Counseled on alcohol cessation with multivitamins prescribed 7. Follow-up with endocrinology [name] in 2 weeks for evaluation of blood glucose control as he is noted to be a brittle type I diabetic  Home Health: Yes with PT and RN  Equipment/Devices: None  Discharge Condition: Stable  CODE STATUS: Full  Diet recommendation: Heart Healthy/carb modified  Brief/Interim Summary: HPI: [name] L Tottenis a 48 y.o.malewith a history of alcoholism, cirrhosis, GERD, diabetes with history of poorly controlled insulin-dependent diabetic ulcer and amputation of left forefoot, hypertension, chronic pancreatitis. Patient seen for weakness that has been ongoing for months accompanied numbness in his hands left side has been worse, but today his right hand became extremely weak. He is unable to grasp or hold anything, and unable to check his sugars at home. He has an unintentional weight loss of 80 pounds over the past couple of years, and is unable to say how much weight he has lost over the past couple of months. He has chronic diarrhea that has been ongoing with no palliating or provoking factors.   He does drink alcohol. Used to drink approximately 140 ounces of liquor, but is now down to 4 ounces daily. He does not get tremors with withdrawal.  -Patient has been admitted with weakness and muscle wasting in the setting of what appears to be poor type 1 diabetes control  along with recent persistent diarrhea which has led to severe hypokalemia. He also has significant history of alcohol abuse.  He required significant amounts of IV and oral potassium supplementation to improve his potassium levels and was noted to have very labile blood glucose readings during the course of his stay requiring D5 infusion at times as well as aggressive insulin dosing.  He is stable for discharge today and has been assessed by physical therapy with recommendations for home health PT.  It appears that he will require increase in the dosing of his Creon as his chronic pancreatitis had caused significant amounts of diarrhea.  Have discussed this with GI with recommendations for doubling of his current dosage with meals and snacks and close follow-up in 4 weeks.  He will need repeat BMP in 1 week with his PCP as well as close follow-up with his endocrinologist in the next 2 weeks to ensure tight blood glucose control.  He is at significant risk for readmissions unfortunately and especially if he continues his significant alcohol abuse.  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  Brittle type 1 diabetes.  Ongoing alcohol abuse.  Discharge Instructions  Discharge Instructions    Diet - low sodium heart healthy   Complete by:  As directed    Increase activity slowly   Complete by: As directed      Allergies as of 02/09/2020   No Known Allergies     Medication List    TAKE these medications   feeding supplement (ENSURE ENLIVE) Liqd Take 237 mLs by mouth 2 (two) times daily between meals.    folic acid 1 MG tablet Commonly known as: FOLVITE Take 1 tablet (1 mg total) by mouth daily. Start taking on: Feb 10, 2020   Insulin Lispro Prot & Lispro (75-25) 100 UNIT/ML Kwikpen Commonly known as: HumaLOG Mix 75/25 KwikPen Inject 100 Units into the skin 2 (two) times daily.   lipase/protease/amylase [address] UNITS Cpep capsule Commonly known as: Creon Take 2 capsules (72,000 Units total) by mouth 3 (three) times daily with meals. TAKE 1 CAPSULE BY MOUTH WITH MEALS AND SNACKS UPTO 5 TIMES A DAY What changed:   how much to take  how to take this  when to take this   loperamide 2 MG capsule Commonly known as: IMODIUM Take 1 capsule (2 mg total) by mouth as needed for diarrhea or loose stools.   multivitamin with minerals Tabs tablet Take 1 tablet by mouth daily. Start taking on: Feb 10, 2020   potassium chloride 10 MEQ tablet Commonly known as: KLOR-CON Take 1 tablet (10 mEq total) by mouth daily.   thiamine 100 MG tablet Take 1 tablet (100 mg total) by mouth daily. Start taking on: Feb 10, 2020   True Metrix Blood Glucose Test test strip Generic drug: glucose blood USE AS DIRECTED TWICE DAILY.   UltiCare Mini Pen Needles 31G X 6 MM Misc Generic drug: Insulin Pen Needle USE AS DIRECTED      Follow-up Information    pcp Follow up in 1 week(s).        [name], MD Follow up in 2 week(s).   Specialty: Endocrinology Contact information: [address] [place] [address] [phone]        Corbin Ade, MD Follow up in 4 week(s).   Specialty: Gastroenterology Contact information: [address] [place] [address] [phone]          No Known Allergies  Consultations:  None   Procedures/Studies: DG Abd 1 View  Result Date: 02/07/2020 CLINICAL DATA:  Diarrhea EXAM: ABDOMEN - 1 VIEW COMPARISON:  02/15/2013 FINDINGS: Scattered large and small bowel gas is noted. Mild retained fecal material in the rectum is seen  which may represent early impaction. Degenerative changes of lumbar spine are seen. No free air is noted. No other focal abnormality is noted. IMPRESSION: Question early impaction in the rectum. No other focal abnormality is noted. Electronically Signed   By: Alcide Clever M.D.   On: 02/07/2020 15:19   CT Head Wo Contrast  Result Date: 02/05/2020 CLINICAL DATA:  49 year old male with dizziness, fall striking head today. Hand weakness for 2 weeks. EXAM: CT HEAD WITHOUT CONTRAST CT CERVICAL SPINE WITHOUT CONTRAST TECHNIQUE: Multidetector CT imaging of the head and cervical spine was performed following the standard protocol without intravenous contrast. Multiplanar CT image reconstructions of the cervical spine were also generated. COMPARISON:  None. FINDINGS: CT HEAD FINDINGS Brain: Generalized cerebral volume loss appears advanced for age. Incidental bulky dural calcifications. No midline shift, ventriculomegaly, mass effect, evidence of mass lesion, intracranial hemorrhage or evidence of cortically based acute infarction. Gray-white matter differentiation is within normal limits throughout the brain. No cortical encephalomalacia identified. Vascular: No suspicious intracranial vascular hyperdensity.  Skull: No fracture identified. Sinuses/Orbits: There is Visualized paranasal sinuses and mastoids are clear. Other: Mild right vertex scalp hematoma suspected on series 3, image 66. Underlying calvarium intact. Possible additional left posterior convexity scalp hematoma on image 33, underlying bone also appears intact. Other orbit and scalp soft tissues appear negative. CT CERVICAL SPINE FINDINGS Alignment: Mild straightening of cervical lordosis. Cervicothoracic junction alignment is within normal limits. Bilateral posterior element alignment is within normal limits. Skull base and vertebrae: Visualized skull base is intact. No atlanto-occipital dissociation. C1 and C2 appear intact and normally aligned. Mild  levoconvex cervical spine curvature. No acute osseous abnormality identified. Soft tissues and spinal canal: No prevertebral fluid or swelling. No visible canal hematoma. Negative noncontrast neck soft tissues. There is nonspecific skin and dermal thickening along the mandible, with a small cystic area on the left series 4, image 51. Disc levels: Advanced degenerative changes at the anterior C1 odontoid articulation with vacuum phenomena. Chronic facet degeneration on the right at C2-C3 also with vacuum phenomena. Elsewhere chronic cervical disc and endplate degeneration. Vacuum disc at C6-C7. Mild if any cervical spinal stenosis. Upper chest: Visible upper thoracic levels appear intact. Negative lung apices. IMPRESSION: 1. Mild bilateral scalp soft tissue injury without underlying skull fracture. 2. No acute traumatic injury identified to the brain or the cervical spine. 3. Both cerebral volume loss and cervical spine degeneration appear advanced for age. Electronically Signed   By: Odessa FlemingH  Hall M.D.   On: 02/05/2020 16:12   CT Cervical Spine Wo Contrast  Result Date: 02/05/2020 CLINICAL DATA:  49 year old male with dizziness, fall striking head today. Hand weakness for 2 weeks. EXAM: CT HEAD WITHOUT CONTRAST CT CERVICAL SPINE WITHOUT CONTRAST TECHNIQUE: Multidetector CT imaging of the head and cervical spine was performed following the standard protocol without intravenous contrast. Multiplanar CT image reconstructions of the cervical spine were also generated. COMPARISON:  None. FINDINGS: CT HEAD FINDINGS Brain: Generalized cerebral volume loss appears advanced for age. Incidental bulky dural calcifications. No midline shift, ventriculomegaly, mass effect, evidence of mass lesion, intracranial hemorrhage or evidence of cortically based acute infarction. Gray-white matter differentiation is within normal limits throughout the brain. No cortical encephalomalacia identified. Vascular: No suspicious intracranial  vascular hyperdensity. Skull: No fracture identified. Sinuses/Orbits: There is Visualized paranasal sinuses and mastoids are clear. Other: Mild right vertex scalp hematoma suspected on series 3, image 66. Underlying calvarium intact. Possible additional left posterior convexity scalp hematoma on image 33, underlying bone also appears intact. Other orbit and scalp soft tissues appear negative. CT CERVICAL SPINE FINDINGS Alignment: Mild straightening of cervical lordosis. Cervicothoracic junction alignment is within normal limits. Bilateral posterior element alignment is within normal limits. Skull base and vertebrae: Visualized skull base is intact. No atlanto-occipital dissociation. C1 and C2 appear intact and normally aligned. Mild levoconvex cervical spine curvature. No acute osseous abnormality identified. Soft tissues and spinal canal: No prevertebral fluid or swelling. No visible canal hematoma. Negative noncontrast neck soft tissues. There is nonspecific skin and dermal thickening along the mandible, with a small cystic area on the left series 4, image 51. Disc levels: Advanced degenerative changes at the anterior C1 odontoid articulation with vacuum phenomena. Chronic facet degeneration on the right at C2-C3 also with vacuum phenomena. Elsewhere chronic cervical disc and endplate degeneration. Vacuum disc at C6-C7. Mild if any cervical spinal stenosis. Upper chest: Visible upper thoracic levels appear intact. Negative lung apices. IMPRESSION: 1. Mild bilateral scalp soft tissue injury without underlying skull fracture. 2. No acute traumatic  injury identified to the brain or the cervical spine. 3. Both cerebral volume loss and cervical spine degeneration appear advanced for age. Electronically Signed   By: Genevie Ann M.D.   On: 02/05/2020 16:12     Discharge Exam: Vitals:   02/09/20 0541 02/09/20 0812  BP: 100/63   Pulse: 83   Resp: 16   Temp: 99.1 F (37.3 C)   SpO2: 99% 98%   Vitals:   02/08/20  1532 02/08/20 2124 02/09/20 0541 02/09/20 0812  BP: 105/79 96/63 100/63   Pulse: 62 77 83   Resp: 18 20 16    Temp: (!) 97.4 F (36.3 C) 98.3 F (36.8 C) 99.1 F (37.3 C)   TempSrc: Oral Oral    SpO2: 95% 100% 99% 98%  Weight:      Height:        General: Pt is alert, awake, not in acute distress, emaciated Cardiovascular: RRR, S1/S2 +, no rubs, no gallops Respiratory: CTA bilaterally, no wheezing, no rhonchi Abdominal: Soft, NT, ND, bowel sounds + Extremities: no edema, no cyanosis    The results of significant diagnostics from this hospitalization (including imaging, microbiology, ancillary and laboratory) are listed below for reference.     Microbiology: Recent Results (from the past 240 hour(s))  SARS Coronavirus 2 by RT PCR (hospital order, performed in [facility] hospital lab) Nasopharyngeal Nasopharyngeal Swab     Status: None   Collection Time: 02/05/20  6:30 PM   Specimen: Nasopharyngeal Swab  Result Value Ref Range Status   SARS Coronavirus 2 NEGATIVE NEGATIVE Final    Comment: (NOTE) SARS-CoV-2 target nucleic acids are NOT DETECTED. The SARS-CoV-2 RNA is generally detectable in upper and lower respiratory specimens during the acute phase of infection. The lowest concentration of SARS-CoV-2 viral copies this assay can detect is 250 copies / mL. A negative result does not preclude SARS-CoV-2 infection and should not be used as the sole basis for treatment or other patient management decisions.  A negative result may occur with improper specimen collection / handling, submission of specimen other than nasopharyngeal swab, presence of viral mutation(s) within the areas targeted by this assay, and inadequate number of viral copies (<250 copies / mL). A negative result must be combined with clinical observations, patient history, and epidemiological information. Fact Sheet for Patients:   StrictlyIdeas.no Fact Sheet for Healthcare  Providers: BankingDealers.co.za This test is not yet approved or cleared  by the [place] FDA and has been authorized for detection and/or diagnosis of SARS-CoV-2 by FDA under an Emergency Use Authorization (EUA).  This EUA will remain in effect (meaning this test can be used) for the duration of the COVID-19 declaration under Section 564(b)(1) of the Act, 21 U.S.C. section 360bbb-3(b)(1), unless the authorization is terminated or revoked sooner. Performed at [facility], [address], [place], [place] [address]   Gastrointestinal Panel by PCR , Stool     Status: None   Collection Time: 02/06/20  7:40 PM   Specimen: Stool  Result Value Ref Range Status   Campylobacter species NOT DETECTED NOT DETECTED Final   Plesimonas shigelloides NOT DETECTED NOT DETECTED Final   Salmonella species NOT DETECTED NOT DETECTED Final   Yersinia enterocolitica NOT DETECTED NOT DETECTED Final   Vibrio species NOT DETECTED NOT DETECTED Final   Vibrio cholerae NOT DETECTED NOT DETECTED Final   Enteroaggregative E coli (EAEC) NOT DETECTED NOT DETECTED Final   Enteropathogenic E coli (EPEC) NOT DETECTED NOT DETECTED Final   Enterotoxigenic E  coli (ETEC) NOT DETECTED NOT DETECTED Final   Shiga like toxin producing E coli (STEC) NOT DETECTED NOT DETECTED Final   Shigella/Enteroinvasive E coli (EIEC) NOT DETECTED NOT DETECTED Final   Cryptosporidium NOT DETECTED NOT DETECTED Final   Cyclospora cayetanensis NOT DETECTED NOT DETECTED Final   Entamoeba histolytica NOT DETECTED NOT DETECTED Final   Giardia lamblia NOT DETECTED NOT DETECTED Final   Adenovirus F40/41 NOT DETECTED NOT DETECTED Final   Astrovirus NOT DETECTED NOT DETECTED Final   Norovirus GI/GII NOT DETECTED NOT DETECTED Final   Rotavirus A NOT DETECTED NOT DETECTED Final   Sapovirus (I, II, IV, and V) NOT DETECTED NOT DETECTED Final    Comment: Performed at [facility], [address] [place], [place]  [address]  Culture, blood (routine x 2)     Status: None (Preliminary result)   Collection Time: 02/07/20  4:53 PM   Specimen: BLOOD  Result Value Ref Range Status   Specimen Description BLOOD RIGHT ANTECUBITAL  Final   Special Requests AEROBIC BOTTLE ONLY Blood Culture adequate volume  Final   Culture   Final    NO GROWTH 2 DAYS Performed at [facility], [address], [place], [place] [address]    Report Status PENDING  Incomplete  Culture, blood (routine x 2)     Status: None (Preliminary result)   Collection Time: 02/07/20  4:53 PM   Specimen: BLOOD  Result Value Ref Range Status   Specimen Description BLOOD RIGHT ANTECUBITAL  Final   Special Requests   Final    BOTTLES DRAWN AEROBIC AND ANAEROBIC Blood Culture adequate volume   Culture   Final    NO GROWTH 2 DAYS Performed at [facility], [address] [place], [place] [address]    Report Status PENDING  Incomplete     Labs: BNP (last 3 results) No results for input(s): BNP in the last 8760 hours. Basic Metabolic Panel: Recent Labs  Lab 02/05/20 1525 02/05/20 2049 02/05/20 2220 02/05/20 2220 02/06/20 0023 02/06/20 0542 02/07/20 0630 02/08/20 0607 02/09/20 0625  NA 131*  --  129*  --   --  131* 135 133* 133*  K <2.0*   < > <2.0*   < > 2.2* <2.0* 2.9* 2.9* 4.2  CL 97*  --  96*  --   --  101 110 109 109  CO2 23  --  22  --   --  20* 18* 18* 19*  GLUCOSE 416*  --  317*  --   --  32* 160* 38* 156*  BUN 20  --  20  --   --  19 20 13 6   CREATININE 1.06  --  0.95  --   --  0.84 0.83 0.54* 0.50*  CALCIUM 9.0  --  8.6*  --   --  8.6* 7.8* 7.8* 7.4*  MG 2.2  --   --   --   --  1.8 1.7 1.6* 1.5*  PHOS  --   --  <1.0*  --   --   --   --   --   --    < > = values in this interval not displayed.   Liver Function Tests: Recent Labs  Lab 02/05/20 1525 02/05/20 2220 02/08/20 0607  AST 29 36 23  ALT 44 43 32  ALKPHOS 134* 126 92  BILITOT 1.2 1.2 0.5  PROT 7.2 6.7 5.2*  ALBUMIN 3.5 3.4* 2.5*   No results for  input(s): LIPASE,  AMYLASE in the last 168 hours. No results for input(s): AMMONIA in the last 168 hours. CBC: Recent Labs  Lab 02/05/20 1525 02/05/20 1525 02/05/20 2220 02/06/20 0542 02/07/20 0630 02/08/20 0607 02/09/20 0625  WBC 10.2   < > 12.2* 4.4 34.0* 23.7* 10.9*  NEUTROABS 7.7  --   --   --   --   --   --   HGB 12.9*   < > 11.6* 12.0* 9.1* 9.3* 9.2*  HCT 35.5*   < > 32.0* 33.2* 25.1* 26.9* 26.5*  MCV 95.9   < > 95.0 93.5 95.8 100.0 100.8*  PLT 123*   < > 111* 86* 79* 91* 106*   < > = values in this interval not displayed.   Cardiac Enzymes: No results for input(s): CKTOTAL, CKMB, CKMBINDEX, TROPONINI in the last 168 hours. BNP: Invalid input(s): POCBNP CBG: Recent Labs  Lab 02/08/20 2129 02/09/20 0146 02/09/20 0206 02/09/20 0542 02/09/20 0807  GLUCAP 187* 221* 223* 161* 130*   D-Dimer No results for input(s): DDIMER in the last 72 hours. Hgb A1c No results for input(s): HGBA1C in the last 72 hours. Lipid Profile No results for input(s): CHOL, HDL, LDLCALC, TRIG, CHOLHDL, LDLDIRECT in the last 72 hours. Thyroid function studies No results for input(s): TSH, T4TOTAL, T3FREE, THYROIDAB in the last 72 hours.  Invalid input(s): FREET3 Anemia work up No results for input(s): VITAMINB12, FOLATE, FERRITIN, TIBC, IRON, RETICCTPCT in the last 72 hours. Urinalysis    Component Value Date/Time   COLORURINE YELLOW 02/05/2020 2100   APPEARANCEUR CLOUDY (A) 02/05/2020 2100   LABSPEC 1.011 02/05/2020 2100   PHURINE 6.0 02/05/2020 2100   GLUCOSEU NEGATIVE 02/05/2020 2100   HGBUR MODERATE (A) 02/05/2020 2100   BILIRUBINUR NEGATIVE 02/05/2020 2100   KETONESUR 5 (A) 02/05/2020 2100   PROTEINUR 100 (A) 02/05/2020 2100   UROBILINOGEN 0.2 02/15/2013 1915   NITRITE NEGATIVE 02/05/2020 2100   LEUKOCYTESUR LARGE (A) 02/05/2020 2100   Sepsis Labs Invalid input(s): PROCALCITONIN,  WBC,  LACTICIDVEN Microbiology Recent Results (from the past 240 hour(s))  SARS Coronavirus 2  by RT PCR (hospital order, performed in [facility] hospital lab) Nasopharyngeal Nasopharyngeal Swab     Status: None   Collection Time: 02/05/20  6:30 PM   Specimen: Nasopharyngeal Swab  Result Value Ref Range Status   SARS Coronavirus 2 NEGATIVE NEGATIVE Final    Comment: (NOTE) SARS-CoV-2 target nucleic acids are NOT DETECTED. The SARS-CoV-2 RNA is generally detectable in upper and lower respiratory specimens during the acute phase of infection. The lowest concentration of SARS-CoV-2 viral copies this assay can detect is 250 copies / mL. A negative result does not preclude SARS-CoV-2 infection and should not be used as the sole basis for treatment or other patient management decisions.  A negative result may occur with improper specimen collection / handling, submission of specimen other than nasopharyngeal swab, presence of viral mutation(s) within the areas targeted by this assay, and inadequate number of viral copies (<250 copies / mL). A negative result must be combined with clinical observations, patient history, and epidemiological information. Fact Sheet for Patients:   BoilerBrush.com.cy Fact Sheet for Healthcare Providers: https://pope.com/ This test is not yet approved or cleared  by the [place] FDA and has been authorized for detection and/or diagnosis of SARS-CoV-2 by FDA under an Emergency Use Authorization (EUA).  This EUA will remain in effect (meaning this test can be used) for the duration of the COVID-19 declaration under Section 564(b)(1) of the Act,  21 U.S.C. section 360bbb-3(b)(1), unless the authorization is terminated or revoked sooner. Performed at [facility], [address], [place], [place] [address]   Gastrointestinal Panel by PCR , Stool     Status: None   Collection Time: 02/06/20  7:40 PM   Specimen: Stool  Result Value Ref Range Status   Campylobacter species NOT DETECTED NOT DETECTED Final    Plesimonas shigelloides NOT DETECTED NOT DETECTED Final   Salmonella species NOT DETECTED NOT DETECTED Final   Yersinia enterocolitica NOT DETECTED NOT DETECTED Final   Vibrio species NOT DETECTED NOT DETECTED Final   Vibrio cholerae NOT DETECTED NOT DETECTED Final   Enteroaggregative E coli (EAEC) NOT DETECTED NOT DETECTED Final   Enteropathogenic E coli (EPEC) NOT DETECTED NOT DETECTED Final   Enterotoxigenic E coli (ETEC) NOT DETECTED NOT DETECTED Final   Shiga like toxin producing E coli (STEC) NOT DETECTED NOT DETECTED Final   Shigella/Enteroinvasive E coli (EIEC) NOT DETECTED NOT DETECTED Final   Cryptosporidium NOT DETECTED NOT DETECTED Final   Cyclospora cayetanensis NOT DETECTED NOT DETECTED Final   Entamoeba histolytica NOT DETECTED NOT DETECTED Final   Giardia lamblia NOT DETECTED NOT DETECTED Final   Adenovirus F40/41 NOT DETECTED NOT DETECTED Final   Astrovirus NOT DETECTED NOT DETECTED Final   Norovirus GI/GII NOT DETECTED NOT DETECTED Final   Rotavirus A NOT DETECTED NOT DETECTED Final   Sapovirus (I, II, IV, and V) NOT DETECTED NOT DETECTED Final    Comment: Performed at [facility], [address] [place], [place] [address]  Culture, blood (routine x 2)     Status: None (Preliminary result)   Collection Time: 02/07/20  4:53 PM   Specimen: BLOOD  Result Value Ref Range Status   Specimen Description BLOOD RIGHT ANTECUBITAL  Final   Special Requests AEROBIC BOTTLE ONLY Blood Culture adequate volume  Final   Culture   Final    NO GROWTH 2 DAYS Performed at [facility], [address] [place], [place] [address]    Report Status PENDING  Incomplete  Culture, blood (routine x 2)     Status: None (Preliminary result)   Collection Time: 02/07/20  4:53 PM   Specimen: BLOOD  Result Value Ref Range Status   Specimen Description BLOOD RIGHT ANTECUBITAL  Final   Special Requests   Final    BOTTLES DRAWN AEROBIC AND ANAEROBIC Blood Culture adequate volume    Culture   Final    NO GROWTH 2 DAYS Performed at [facility], [address] [place], [place] [address]    Report Status PENDING  Incomplete     Time coordinating discharge: 40 minutes  SIGNED:   Erick Blinks, DO [facility] 02/09/2020, 10:25 AM  If 7PM-7AM, please contact night-coverage [email or web address]

## 2020-02-10 LAB — GLUCOSE, CAPILLARY
Glucose-Capillary: 32 mg/dL — CL (ref 70–99)
Glucose-Capillary: 25 mg/dL — CL (ref 70–99)
Glucose-Capillary: <10 mg/dL — CL (ref 70–99)

## 2020-02-12 ENCOUNTER — Other Ambulatory Visit: Payer: Self-pay | Admitting: "Endocrinology

## 2020-02-12 LAB — CULTURE, BLOOD (ROUTINE X 2)
Culture: NO GROWTH
Culture: NO GROWTH
Special Requests: ADEQUATE
Special Requests: ADEQUATE

## 2020-02-16 DIAGNOSIS — R404 Transient alteration of awareness: Secondary | ICD-10-CM | POA: Diagnosis not present

## 2020-02-16 DIAGNOSIS — R4020 Unspecified coma: Secondary | ICD-10-CM | POA: Diagnosis not present

## 2020-02-16 DIAGNOSIS — E162 Hypoglycemia, unspecified: Secondary | ICD-10-CM | POA: Diagnosis not present

## 2020-02-16 DIAGNOSIS — E161 Other hypoglycemia: Secondary | ICD-10-CM | POA: Diagnosis not present

## 2020-02-16 DIAGNOSIS — I499 Cardiac arrhythmia, unspecified: Secondary | ICD-10-CM | POA: Diagnosis not present

## 2020-02-22 ENCOUNTER — Ambulatory Visit: Payer: Medicare Other | Admitting: "Endocrinology

## 2020-03-17 DEATH — deceased
